# Patient Record
Sex: Female | Born: 1980
Health system: Southern US, Community
[De-identification: ages and names within clinical notes are randomized; demographics above are authoritative.]

## PROBLEM LIST (undated history)

## (undated) DIAGNOSIS — E0590 Thyrotoxicosis, unspecified without thyrotoxic crisis or storm: Secondary | ICD-10-CM

## (undated) DIAGNOSIS — E785 Hyperlipidemia, unspecified: Secondary | ICD-10-CM

## (undated) DIAGNOSIS — R7611 Nonspecific reaction to tuberculin skin test without active tuberculosis: Secondary | ICD-10-CM

## (undated) DIAGNOSIS — E041 Nontoxic single thyroid nodule: Secondary | ICD-10-CM

## (undated) DIAGNOSIS — H547 Unspecified visual loss: Secondary | ICD-10-CM

## (undated) DIAGNOSIS — R768 Other specified abnormal immunological findings in serum: Secondary | ICD-10-CM

## (undated) DIAGNOSIS — E119 Type 2 diabetes mellitus without complications: Secondary | ICD-10-CM

## (undated) DIAGNOSIS — O24419 Gestational diabetes mellitus in pregnancy, unspecified control: Secondary | ICD-10-CM

## (undated) HISTORY — DX: Nontoxic single thyroid nodule: E04.1

## (undated) HISTORY — DX: Unspecified visual loss: H54.7

## (undated) HISTORY — DX: Other specified abnormal immunological findings in serum: R76.8

## (undated) HISTORY — DX: Thyrotoxicosis, unspecified without thyrotoxic crisis or storm: E05.90

## (undated) HISTORY — DX: Hyperlipidemia, unspecified: E78.5

## (undated) HISTORY — DX: Nonspecific reaction to tuberculin skin test without active tuberculosis: R76.11

---

## 1999-11-11 ENCOUNTER — Emergency Department (HOSPITAL_COMMUNITY): Admission: EM | Admit: 1999-11-11 | Discharge: 1999-11-11 | Payer: Self-pay | Admitting: Emergency Medicine

## 2000-01-30 ENCOUNTER — Ambulatory Visit (HOSPITAL_COMMUNITY): Admission: RE | Admit: 2000-01-30 | Discharge: 2000-01-30 | Payer: Self-pay | Admitting: *Deleted

## 2000-04-07 ENCOUNTER — Ambulatory Visit (HOSPITAL_COMMUNITY): Admission: RE | Admit: 2000-04-07 | Discharge: 2000-04-07 | Payer: Self-pay | Admitting: *Deleted

## 2000-06-01 ENCOUNTER — Ambulatory Visit (HOSPITAL_COMMUNITY): Admission: RE | Admit: 2000-06-01 | Discharge: 2000-06-01 | Payer: Self-pay | Admitting: *Deleted

## 2000-06-23 ENCOUNTER — Inpatient Hospital Stay (HOSPITAL_COMMUNITY): Admission: AD | Admit: 2000-06-23 | Discharge: 2000-06-23 | Payer: Self-pay | Admitting: *Deleted

## 2000-07-21 ENCOUNTER — Inpatient Hospital Stay (HOSPITAL_COMMUNITY): Admission: AD | Admit: 2000-07-21 | Discharge: 2000-07-23 | Payer: Self-pay | Admitting: Obstetrics

## 2002-03-31 ENCOUNTER — Inpatient Hospital Stay (HOSPITAL_COMMUNITY): Admission: AD | Admit: 2002-03-31 | Discharge: 2002-03-31 | Payer: Self-pay | Admitting: Obstetrics and Gynecology

## 2002-04-13 ENCOUNTER — Encounter: Admission: RE | Admit: 2002-04-13 | Discharge: 2002-04-13 | Payer: Self-pay | Admitting: Obstetrics and Gynecology

## 2002-08-28 ENCOUNTER — Inpatient Hospital Stay (HOSPITAL_COMMUNITY): Admission: AD | Admit: 2002-08-28 | Discharge: 2002-08-28 | Payer: Self-pay | Admitting: *Deleted

## 2002-09-05 ENCOUNTER — Encounter: Admission: RE | Admit: 2002-09-05 | Discharge: 2002-09-05 | Payer: Self-pay | Admitting: Obstetrics and Gynecology

## 2002-09-17 ENCOUNTER — Inpatient Hospital Stay (HOSPITAL_COMMUNITY): Admission: AD | Admit: 2002-09-17 | Discharge: 2002-09-17 | Payer: Self-pay | Admitting: *Deleted

## 2002-11-07 ENCOUNTER — Encounter: Admission: RE | Admit: 2002-11-07 | Discharge: 2002-11-07 | Payer: Self-pay | Admitting: Obstetrics and Gynecology

## 2002-11-08 ENCOUNTER — Emergency Department (HOSPITAL_COMMUNITY): Admission: EM | Admit: 2002-11-08 | Discharge: 2002-11-08 | Payer: Self-pay | Admitting: Emergency Medicine

## 2002-11-09 ENCOUNTER — Emergency Department (HOSPITAL_COMMUNITY): Admission: EM | Admit: 2002-11-09 | Discharge: 2002-11-09 | Payer: Self-pay | Admitting: Emergency Medicine

## 2002-11-09 ENCOUNTER — Encounter: Payer: Self-pay | Admitting: Emergency Medicine

## 2003-02-21 ENCOUNTER — Inpatient Hospital Stay (HOSPITAL_COMMUNITY): Admission: AD | Admit: 2003-02-21 | Discharge: 2003-02-21 | Payer: Self-pay | Admitting: Obstetrics and Gynecology

## 2003-03-01 ENCOUNTER — Encounter: Admission: RE | Admit: 2003-03-01 | Discharge: 2003-03-01 | Payer: Self-pay | Admitting: Family Medicine

## 2003-03-13 ENCOUNTER — Encounter: Admission: RE | Admit: 2003-03-13 | Discharge: 2003-03-13 | Payer: Self-pay | Admitting: Obstetrics and Gynecology

## 2003-04-12 ENCOUNTER — Encounter: Admission: RE | Admit: 2003-04-12 | Discharge: 2003-04-12 | Payer: Self-pay | Admitting: Obstetrics and Gynecology

## 2003-05-16 ENCOUNTER — Emergency Department (HOSPITAL_COMMUNITY): Admission: EM | Admit: 2003-05-16 | Discharge: 2003-05-16 | Payer: Self-pay | Admitting: Emergency Medicine

## 2006-07-13 DIAGNOSIS — H547 Unspecified visual loss: Secondary | ICD-10-CM

## 2006-07-13 HISTORY — DX: Unspecified visual loss: H54.7

## 2009-07-13 DIAGNOSIS — E119 Type 2 diabetes mellitus without complications: Secondary | ICD-10-CM

## 2009-07-13 HISTORY — DX: Type 2 diabetes mellitus without complications: E11.9

## 2009-08-16 ENCOUNTER — Ambulatory Visit: Payer: Self-pay | Admitting: Obstetrics and Gynecology

## 2009-08-16 ENCOUNTER — Inpatient Hospital Stay (HOSPITAL_COMMUNITY): Admission: AD | Admit: 2009-08-16 | Discharge: 2009-08-16 | Payer: Self-pay | Admitting: Family Medicine

## 2009-09-13 ENCOUNTER — Ambulatory Visit: Payer: Self-pay | Admitting: Obstetrics and Gynecology

## 2009-09-13 ENCOUNTER — Inpatient Hospital Stay (HOSPITAL_COMMUNITY): Admission: AD | Admit: 2009-09-13 | Discharge: 2009-09-13 | Payer: Self-pay | Admitting: Obstetrics & Gynecology

## 2009-10-26 ENCOUNTER — Inpatient Hospital Stay (HOSPITAL_COMMUNITY): Admission: AD | Admit: 2009-10-26 | Discharge: 2009-10-27 | Payer: Self-pay | Admitting: Obstetrics

## 2009-10-27 ENCOUNTER — Emergency Department (HOSPITAL_COMMUNITY): Admission: EM | Admit: 2009-10-27 | Discharge: 2009-10-27 | Payer: Self-pay | Admitting: Emergency Medicine

## 2009-12-17 ENCOUNTER — Ambulatory Visit (HOSPITAL_COMMUNITY)
Admission: RE | Admit: 2009-12-17 | Discharge: 2009-12-17 | Payer: Self-pay | Source: Home / Self Care | Admitting: Obstetrics

## 2009-12-26 ENCOUNTER — Encounter: Payer: Self-pay | Admitting: Obstetrics

## 2009-12-26 ENCOUNTER — Encounter: Admission: RE | Admit: 2009-12-26 | Discharge: 2009-12-26 | Payer: Self-pay | Admitting: Obstetrics

## 2010-01-18 ENCOUNTER — Inpatient Hospital Stay (HOSPITAL_COMMUNITY): Admission: AD | Admit: 2010-01-18 | Discharge: 2010-01-18 | Payer: Self-pay | Admitting: Obstetrics

## 2010-01-18 ENCOUNTER — Ambulatory Visit: Payer: Self-pay | Admitting: Nurse Practitioner

## 2010-02-18 ENCOUNTER — Ambulatory Visit (HOSPITAL_COMMUNITY): Admission: RE | Admit: 2010-02-18 | Discharge: 2010-02-18 | Payer: Self-pay | Admitting: Obstetrics

## 2010-02-21 ENCOUNTER — Encounter: Payer: Self-pay | Admitting: Obstetrics

## 2010-02-21 ENCOUNTER — Inpatient Hospital Stay (HOSPITAL_COMMUNITY): Admission: AD | Admit: 2010-02-21 | Discharge: 2010-02-24 | Payer: Self-pay | Admitting: Obstetrics

## 2010-07-15 ENCOUNTER — Inpatient Hospital Stay (HOSPITAL_COMMUNITY)
Admission: AD | Admit: 2010-07-15 | Discharge: 2010-07-15 | Payer: Self-pay | Source: Home / Self Care | Attending: Obstetrics & Gynecology | Admitting: Obstetrics & Gynecology

## 2010-08-03 ENCOUNTER — Encounter: Payer: Self-pay | Admitting: Obstetrics

## 2010-08-11 ENCOUNTER — Emergency Department (HOSPITAL_COMMUNITY)
Admission: EM | Admit: 2010-08-11 | Discharge: 2010-08-11 | Payer: Self-pay | Source: Home / Self Care | Admitting: Emergency Medicine

## 2010-08-11 LAB — URINALYSIS, ROUTINE W REFLEX MICROSCOPIC
Bilirubin Urine: NEGATIVE
Hgb urine dipstick: NEGATIVE
Ketones, ur: NEGATIVE mg/dL
Nitrite: NEGATIVE
Protein, ur: NEGATIVE mg/dL
Specific Gravity, Urine: 1.019 (ref 1.005–1.030)
Urine Glucose, Fasting: NEGATIVE mg/dL
Urobilinogen, UA: 0.2 mg/dL (ref 0.0–1.0)
pH: 6.5 (ref 5.0–8.0)

## 2010-08-11 LAB — DIFFERENTIAL
Basophils Absolute: 0 10*3/uL (ref 0.0–0.1)
Basophils Relative: 0 % (ref 0–1)
Eosinophils Absolute: 0.1 10*3/uL (ref 0.0–0.7)
Eosinophils Relative: 1 % (ref 0–5)
Lymphocytes Relative: 13 % (ref 12–46)
Lymphs Abs: 1.2 10*3/uL (ref 0.7–4.0)
Monocytes Absolute: 0.6 10*3/uL (ref 0.1–1.0)
Monocytes Relative: 6 % (ref 3–12)
Neutro Abs: 7.5 10*3/uL (ref 1.7–7.7)
Neutrophils Relative %: 80 % — ABNORMAL HIGH (ref 43–77)

## 2010-08-11 LAB — COMPREHENSIVE METABOLIC PANEL
ALT: 17 U/L (ref 0–35)
AST: 21 U/L (ref 0–37)
Albumin: 3.6 g/dL (ref 3.5–5.2)
Alkaline Phosphatase: 67 U/L (ref 39–117)
BUN: 8 mg/dL (ref 6–23)
CO2: 22 mEq/L (ref 19–32)
Calcium: 9.2 mg/dL (ref 8.4–10.5)
Chloride: 109 mEq/L (ref 96–112)
Creatinine, Ser: 0.51 mg/dL (ref 0.4–1.2)
GFR calc Af Amer: >60 mL/min (ref >60)
GFR calc non Af Amer: >60 mL/min (ref >60)
Glucose, Bld: 191 mg/dL — ABNORMAL HIGH (ref 70–99)
Potassium: 3.8 mEq/L (ref 3.5–5.1)
Sodium: 140 mEq/L (ref 135–145)
Total Bilirubin: 0.7 mg/dL (ref 0.3–1.2)
Total Protein: 7.7 g/dL (ref 6.0–8.3)

## 2010-08-11 LAB — CBC
HCT: 38.6 % (ref 36.0–46.0)
Hemoglobin: 13 g/dL (ref 12.0–15.0)
MCH: 28.1 pg (ref 26.0–34.0)
MCHC: 33.7 g/dL (ref 30.0–36.0)
MCV: 83.4 fL (ref 78.0–100.0)
Platelets: 228 10*3/uL (ref 150–400)
RBC: 4.63 MIL/uL (ref 3.87–5.11)
RDW: 14.3 % (ref 11.5–15.5)
WBC: 9.4 10*3/uL (ref 4.0–10.5)

## 2010-08-11 LAB — LIPASE, BLOOD: Lipase: 19 U/L (ref 11–59)

## 2010-08-11 LAB — URINE MICROSCOPIC-ADD ON

## 2010-08-11 LAB — PREGNANCY, URINE: Preg Test, Ur: NEGATIVE

## 2010-09-22 LAB — URINALYSIS, ROUTINE W REFLEX MICROSCOPIC
Bilirubin Urine: NEGATIVE
Glucose, UA: NEGATIVE mg/dL
Ketones, ur: NEGATIVE mg/dL
Leukocytes, UA: NEGATIVE
Nitrite: NEGATIVE
Protein, ur: NEGATIVE mg/dL
Specific Gravity, Urine: 1.01 (ref 1.005–1.030)
Urobilinogen, UA: 0.2 mg/dL (ref 0.0–1.0)
pH: 6.5 (ref 5.0–8.0)

## 2010-09-22 LAB — CBC
HCT: 39.8 % (ref 36.0–46.0)
Hemoglobin: 12.6 g/dL (ref 12.0–15.0)
MCH: 26.4 pg (ref 26.0–34.0)
MCHC: 31.7 g/dL (ref 30.0–36.0)
MCV: 83.4 fL (ref 78.0–100.0)
Platelets: 222 10*3/uL (ref 150–400)
RBC: 4.77 MIL/uL (ref 3.87–5.11)
RDW: 14.1 % (ref 11.5–15.5)
WBC: 6 10*3/uL (ref 4.0–10.5)

## 2010-09-22 LAB — POCT PREGNANCY, URINE: Preg Test, Ur: NEGATIVE

## 2010-09-22 LAB — WET PREP, GENITAL
WBC, Wet Prep HPF POC: NONE SEEN
Yeast Wet Prep HPF POC: NONE SEEN

## 2010-09-22 LAB — URINE MICROSCOPIC-ADD ON

## 2010-09-22 LAB — GC/CHLAMYDIA PROBE AMP, GENITAL
Chlamydia, DNA Probe: NEGATIVE
GC Probe Amp, Genital: NEGATIVE

## 2010-09-26 LAB — GLUCOSE, CAPILLARY
Glucose-Capillary: 111 mg/dL — ABNORMAL HIGH (ref 70–99)
Glucose-Capillary: 80 mg/dL (ref 70–99)

## 2010-09-26 LAB — CBC
HCT: 38.2 % (ref 36.0–46.0)
Hemoglobin: 12.7 g/dL (ref 12.0–15.0)
MCH: 30.2 pg (ref 26.0–34.0)
MCHC: 33.3 g/dL (ref 30.0–36.0)
MCHC: 33.8 g/dL (ref 30.0–36.0)
MCV: 88.1 fL (ref 78.0–100.0)
Platelets: 152 10*3/uL (ref 150–400)
RBC: 3.74 MIL/uL — ABNORMAL LOW (ref 3.87–5.11)

## 2010-09-28 LAB — CBC
HCT: 35.2 % — ABNORMAL LOW (ref 36.0–46.0)
MCH: 29.5 pg (ref 26.0–34.0)
MCHC: 33.4 g/dL (ref 30.0–36.0)
MCV: 88.3 fL (ref 78.0–100.0)
RDW: 13.8 % (ref 11.5–15.5)

## 2010-09-28 LAB — URINALYSIS, ROUTINE W REFLEX MICROSCOPIC
Bilirubin Urine: NEGATIVE
Ketones, ur: NEGATIVE mg/dL
Nitrite: NEGATIVE
Specific Gravity, Urine: 1.01 (ref 1.005–1.030)
Urobilinogen, UA: 0.2 mg/dL (ref 0.0–1.0)

## 2010-09-28 LAB — BASIC METABOLIC PANEL
BUN: 2 mg/dL — ABNORMAL LOW (ref 6–23)
CO2: 23 mEq/L (ref 19–32)
Chloride: 108 mEq/L (ref 96–112)
Glucose, Bld: 93 mg/dL (ref 70–99)
Potassium: 3.6 mEq/L (ref 3.5–5.1)

## 2010-09-28 LAB — GLUCOSE, CAPILLARY: Glucose-Capillary: 106 mg/dL — ABNORMAL HIGH (ref 70–99)

## 2010-09-30 LAB — COMPREHENSIVE METABOLIC PANEL
AST: 19 U/L (ref 0–37)
CO2: 23 mEq/L (ref 19–32)
Calcium: 9.5 mg/dL (ref 8.4–10.5)
Creatinine, Ser: 0.34 mg/dL — ABNORMAL LOW (ref 0.4–1.2)
GFR calc Af Amer: >60 mL/min (ref >60)
GFR calc non Af Amer: >60 mL/min (ref >60)

## 2010-09-30 LAB — CBC
MCHC: 35.3 g/dL (ref 30.0–36.0)
MCV: 87.4 fL (ref 78.0–100.0)
Platelets: 170 10*3/uL (ref 150–400)
RBC: 3.87 MIL/uL (ref 3.87–5.11)
RDW: 14.2 % (ref 11.5–15.5)

## 2010-09-30 LAB — URINALYSIS, ROUTINE W REFLEX MICROSCOPIC
Bilirubin Urine: NEGATIVE
Nitrite: NEGATIVE
Nitrite: NEGATIVE
Specific Gravity, Urine: 1.009 (ref 1.005–1.030)
Specific Gravity, Urine: 1.01 (ref 1.005–1.030)
Urobilinogen, UA: 0.2 mg/dL (ref 0.0–1.0)
pH: 7.5 (ref 5.0–8.0)

## 2010-09-30 LAB — LIPASE, BLOOD: Lipase: 16 U/L (ref 11–59)

## 2010-09-30 LAB — DIFFERENTIAL
Eosinophils Relative: 3 % (ref 0–5)
Lymphocytes Relative: 16 % (ref 12–46)
Lymphs Abs: 1.3 10*3/uL (ref 0.7–4.0)

## 2010-09-30 LAB — GLUCOSE, CAPILLARY: Glucose-Capillary: 114 mg/dL — ABNORMAL HIGH (ref 70–99)

## 2010-10-01 LAB — CBC
Platelets: 221 10*3/uL (ref 150–400)
RDW: 14.4 % (ref 11.5–15.5)

## 2010-10-01 LAB — GLUCOSE, CAPILLARY

## 2010-10-01 LAB — POCT PREGNANCY, URINE: Preg Test, Ur: POSITIVE

## 2010-10-03 LAB — URINALYSIS, ROUTINE W REFLEX MICROSCOPIC
Bilirubin Urine: NEGATIVE
Glucose, UA: 250 mg/dL — AB
Ketones, ur: NEGATIVE mg/dL
Nitrite: NEGATIVE
Protein, ur: NEGATIVE mg/dL

## 2010-11-28 NOTE — Group Therapy Note (Signed)
   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  TSH,  LH, and FSH were done previously and were all normal.  The patient was  placed on a temperature chart, but apparently had never used it.  She was in  the MAU because she had gone three months without a period and then had an  18-day period.  As of now she is not bleeding.  She was obviously  dysfunctional bleeding episode due to inovulation.  We talked to the patient  about trying Clomid for one month or starting her on a temperature chart,  starting her on Clomid 50 mg once a day for five days.  If this is not  successful we will up it to 10 mg, etc.  She seems to understand and we have  gone over once again the explanation of basal temperature chart and trying  to get this lady ovulatory.  Certainly, consideration has to be made for  trying Glucophage also along with this as we progress.   IMPRESSION:  Anovulatory bleeding probably secondary to polycystic ovarian  syndrome.                                               Argentina Donovan, MD    PR/MEDQ  D:  03/01/2003  T:  03/02/2003  Job:  [id number]

## 2010-11-28 NOTE — Group Therapy Note (Signed)
   NAMEBREYANNA, Harris NO.:  [id number]   MEDICAL RECORD NO.:  [id number]                   PATIENT TYPE:  OUT   LOCATION:  [facility]                           FACILITY:  [facility]   PHYSICIAN:  Argentina Donovan, MD                     DATE OF BIRTH:  1981/03/08   DATE OF SERVICE:  04/12/2003                                    CLINIC NOTE   CHIEF COMPLAINT:  Follow up infertility.   SUBJECTIVE:  The patient comes in today requesting a basal body temperature  thermometer.  At her last visit with Dr. Okey Dupre, March 13, 2003, she was  placed on Clomid and asked to get a thermometer but when she went to the [facility] on [address] they told her that they did not have any.  She has not been charting her body temperatures because of this and returns  today needing a thermometer.  Her last menstrual period April 10, 2003.  They have been regular the last few months.  No other complaints.   OBJECTIVE:  Vital signs noted.  In general obese female awake and alert in  no acute distress.  Hirsutism on the cheeks.  Acanthosis nigricans on the  neck.   ASSESSMENT AND PLAN:  Anovulatory bleeding probably secondary to polycystic  ovarian syndrome.  The patient was given a basal body thermometer today.  She has already been given body temperature charts and told how to use the  thermometer.  Gave further prescription for Clomid 50 mg one p.o. daily on  day 5 through 9 of the cycle, with no refills, and advised the patient on  how to use this medicine.  She will follow up in 30 days if she does not  have another menses or follow up when she gets her next period.     Rosemarie Ax, MD                      Argentina Donovan, MD    NR/MEDQ  D:  04/12/2003  T:  04/12/2003  Job:  [id number]

## 2012-08-15 ENCOUNTER — Emergency Department (HOSPITAL_COMMUNITY)
Admission: EM | Admit: 2012-08-15 | Discharge: 2012-08-15 | Disposition: A | Discharge status: Home / Self Care | Payer: 59 | Source: Home / Self Care | Attending: Emergency Medicine | Admitting: Emergency Medicine

## 2012-08-15 ENCOUNTER — Encounter (HOSPITAL_COMMUNITY): Payer: Self-pay

## 2012-08-15 DIAGNOSIS — E119 Type 2 diabetes mellitus without complications: Secondary | ICD-10-CM

## 2012-08-15 DIAGNOSIS — R531 Weakness: Secondary | ICD-10-CM

## 2012-08-15 DIAGNOSIS — R5381 Other malaise: Secondary | ICD-10-CM

## 2012-08-15 DIAGNOSIS — R51 Headache: Secondary | ICD-10-CM

## 2012-08-15 DIAGNOSIS — R42 Dizziness and giddiness: Secondary | ICD-10-CM

## 2012-08-15 HISTORY — DX: Type 2 diabetes mellitus without complications: E11.9

## 2012-08-15 LAB — POCT I-STAT, CHEM 8
BUN: 6 mg/dL (ref 6–23)
Creatinine, Ser: 0.4 mg/dL — ABNORMAL LOW (ref 0.50–1.10)
Glucose, Bld: 135 mg/dL — ABNORMAL HIGH (ref 70–99)
Potassium: 3.9 mEq/L (ref 3.5–5.1)
Sodium: 140 mEq/L (ref 135–145)
TCO2: 23 mmol/L (ref 0–100)

## 2012-08-15 MED ORDER — METFORMIN HCL 500 MG PO TABS: 500.0000 mg | ORAL_TABLET | Freq: Two times a day (BID) | ORAL | Status: DC

## 2012-08-15 MED ORDER — MECLIZINE HCL 25 MG PO TABS: 25.0000 mg | ORAL_TABLET | Freq: Three times a day (TID) | ORAL | Status: DC | PRN

## 2012-08-15 MED ORDER — NAPROXEN 500 MG PO TABS: 500.0000 mg | ORAL_TABLET | Freq: Two times a day (BID) | ORAL | Status: DC

## 2012-08-15 NOTE — ED Notes (Signed)
Called to triage this pt. Pt c/o feeling "weak, dizzy, & having a headache all day." Pt states that she is out of her DM rx x approx 2 weeks. Pt denies CP, SOB, abd pain, n/v/d, vision changes, & all other neuro complaints (besides prev doc). Pt CBG is 125 here. Pt skin W&D, speaks in complete multiple word sentences w/o taking a breath in between words in same sentence. Pt & family member instructed to advise a staff member if anything changed in her current complaint/condition prior to being called to the treatment area/exam room. Pt & her family member verbalized their understanding of this instruction.

## 2012-08-15 NOTE — ED Provider Notes (Signed)
Chief Complaint  Patient presents with  . Headache    History of Present Illness:   [name] is a 32 year old female who presents today for multiple symptoms including weakness, tremulousness, dizziness, and headache. She's had a two-year history of diabetes and has been taking a medication for this, although she does not know the name of the medication. She ran out of this 2 weeks ago. She denies any polyuria, polydipsia, or dry mouth. She states her vision has been a little bit blurry and sometimes she sees double. She has felt weak, shaky, and sometimes has hot flashes since this morning. She's felt dizzy and lightheaded. She feels like her head is spinning. She's also had a bifrontal headache and some nausea. She denies any photophobia or phonophobia. She had slight nasal congestion rhinorrhea but denies sore throat cough. Her eyes have been watery. She denies any focal weakness or paresthesias. No difficulty speaking, swallowing, or with coordination or balance.  Review of Systems:  Other than noted above, the patient denies any of the following symptoms. Systemic:  No fever, chills, sweats, fatigue, myalgias, headache, or anorexia. Eye:  No redness, pain or drainage. ENT:  No earache, nasal congestion, rhinorrhea, sinus pressure, or sore throat. Lungs:  No cough, sputum production, wheezing, shortness of breath.  Cardiovascular:  No chest pain, palpitations, or syncope. GI:  No nausea, vomiting, abdominal pain or diarrhea. GU:  No dysuria, frequency, or hematuria. Skin:  No rash or pruritis.  PMFSH:  Past medical history, family history, social history, meds, and allergies were reviewed.   Physical Exam:   Vital signs:  BP 127/77  Pulse 90  Temp 98.9 F (37.2 C) (Oral)  Resp 20  SpO2 98% General:  Alert, in no distress. Eye:  PERRL, full EOMs.  Lids and conjunctivas were normal. ENT:  TMs and canals were normal, without erythema or inflammation.  Nasal mucosa was clear and  uncongested, without drainage.  Mucous membranes were moist.  Pharynx was clear, without exudate or drainage.  There were no oral ulcerations or lesions. Neck:  Supple, no adenopathy, tenderness or mass. Thyroid was normal. Lungs:  No respiratory distress.  Lungs were clear to auscultation, without wheezes, rales or rhonchi.  Breath sounds were clear and equal bilaterally. Heart:  Regular rhythm, without gallops, murmers or rubs. Abdomen:  Soft, flat, and non-tender to palpation.  No hepatosplenomagaly or mass. Neurological exam: Neurological examination: The patient is alert and oriented x3. Speech is clear, fluent, and appropriate. Cranial nerves are intact. There is no pronator drift and finger to nose was normal. Muscle strength, sensation, and DTRs are normal. Babinskis are downgoing. Station and gait were normal. Romberg sign is abnormal in that the patient was unable to maintain balance while standing with her feet together even with her eyes open, and tandem gait was not performed. Skin:  Clear, warm, and dry, without rash or lesions.  Labs:   Results for orders placed during the hospital encounter of 08/15/12  GLUCOSE, CAPILLARY      Component Value Range   Glucose-Capillary 125 (*) 70 - 99 mg/dL   Comment 1 Documented in Chart     Comment 2 Notify RN    POCT I-STAT, CHEM 8      Component Value Range   Sodium 140  135 - 145 mEq/L   Potassium 3.9  3.5 - 5.1 mEq/L   Chloride 106  96 - 112 mEq/L   BUN 6  6 - 23 mg/dL   Creatinine,  Ser 0.40 (*) 0.50 - 1.10 mg/dL   Glucose, Bld 119 (*) 70 - 99 mg/dL   Calcium, Ion 1.47 (*) 1.12 - 1.23 mmol/L   TCO2 23  0 - 100 mmol/L   Hemoglobin 13.9  12.0 - 15.0 g/dL   HCT 82.9  56.2 - 13.0 %    Assessment:  The primary encounter diagnosis was Dizziness. Diagnoses of Headache, Weakness, and Type 2 diabetes mellitus were also pertinent to this visit.  Despite her being off of her diabetes medicines, her blood sugars aren't too bad right now. I did  give her refill on metformin, although she was unable to tell me the name of the medication that she has been taking for 2 years. She is going to go back to see her primary care physician to followup on her diabetes. Her symptoms could be viral. I'm going to treat symptomatically with meclizine and naproxen and to her return again in 3 or 4 days if no improvement.  Plan:   1.  The following meds were prescribed:   New Prescriptions   MECLIZINE (ANTIVERT) 25 MG TABLET    Take 1 tablet (25 mg total) by mouth 3 (three) times daily as needed.   METFORMIN (GLUCOPHAGE) 500 MG TABLET    Take 1 tablet (500 mg total) by mouth 2 (two) times daily with a meal.   NAPROXEN (NAPROSYN) 500 MG TABLET    Take 1 tablet (500 mg total) by mouth 2 (two) times daily.   2.  The patient was instructed in symptomatic care and handouts were given. 3.  The patient was told to return if becoming worse in any way, if no better in 3 or 4 days, and given some red flag symptoms that would indicate earlier return.    Reuben Likes, MD 08/15/12 1901

## 2012-08-15 NOTE — ED Notes (Signed)
C/o feeling weak and dizzy , out of her DM medication x 2 weeks. Denies CP, SOB, N/V/D, chest pain. Is supposed to see her MD in few days to get refills of her Rx ( not sure what they are)

## 2012-08-18 ENCOUNTER — Emergency Department (INDEPENDENT_AMBULATORY_CARE_PROVIDER_SITE_OTHER)
Admission: EM | Admit: 2012-08-18 | Discharge: 2012-08-18 | Disposition: A | Discharge status: Home / Self Care | Payer: 59 | Source: Home / Self Care | Attending: Family Medicine | Admitting: Family Medicine

## 2012-08-18 ENCOUNTER — Encounter (HOSPITAL_COMMUNITY): Payer: Self-pay | Admitting: *Deleted

## 2012-08-18 DIAGNOSIS — E119 Type 2 diabetes mellitus without complications: Secondary | ICD-10-CM

## 2012-08-18 DIAGNOSIS — R42 Dizziness and giddiness: Secondary | ICD-10-CM

## 2012-08-18 LAB — COMPREHENSIVE METABOLIC PANEL
ALT: 11 U/L (ref 0–35)
AST: 12 U/L (ref 0–37)
Albumin: 3.7 g/dL (ref 3.5–5.2)
Alkaline Phosphatase: 67 U/L (ref 39–117)
Chloride: 101 mEq/L (ref 96–112)
Potassium: 3.9 mEq/L (ref 3.5–5.1)
Sodium: 137 mEq/L (ref 135–145)
Total Protein: 7.8 g/dL (ref 6.0–8.3)

## 2012-08-18 LAB — POCT I-STAT, CHEM 8
BUN: 6 mg/dL (ref 6–23)
Calcium, Ion: 1.24 mmol/L — ABNORMAL HIGH (ref 1.12–1.23)
Creatinine, Ser: 0.5 mg/dL (ref 0.50–1.10)
Glucose, Bld: 203 mg/dL — ABNORMAL HIGH (ref 70–99)
TCO2: 25 mmol/L (ref 0–100)

## 2012-08-18 LAB — HEMOGLOBIN A1C: Hgb A1c MFr Bld: 7.9 % — ABNORMAL HIGH (ref <5.7)

## 2012-08-18 LAB — TSH: TSH: 0.828 u[IU]/mL (ref 0.350–4.500)

## 2012-08-18 MED ORDER — GLIPIZIDE 2.5 MG HALF TABLET: 10.0000 mg | ORAL_TABLET | Freq: Two times a day (BID) | ORAL | Status: DC

## 2012-08-18 NOTE — ED Provider Notes (Signed)
History     CSN: [id number]  Arrival date & time 08/18/12  1014   First MD Initiated Contact with Patient 08/18/12 1030      Chief Complaint  Patient presents with  . Dizziness  . Shaking    (Consider location/radiation/quality/duration/timing/severity/associated sxs/prior treatment) HPI Comments: 32 year old female with history of diabetes type 2. Here complaining of dizziness and feeling shaky. She was seen here last week with similar symptoms after she had been for 2 weeks without her diabetes medications. She had metformin  500 mgrefilled and states that she's taking it twice a day. She is not checking her blood sugar at home. 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 Reports polyuria and polydipsia. Denies sweating. Denies fever or chills. Denies blurry vision. Denies chest pain or shortness of breath. No palpitations. No abdominal pain nausea vomiting or diarrhea. No falls or loss of balance. Patient reports she has a primary care provider followup appointment next Tuesday for diabetes follow up. No prior history of thyroid disease.   Past Medical History  Diagnosis Date  . Diabetes mellitus without complication     History reviewed. No pertinent past surgical history.  Family History  Problem Relation Age of Onset  . Family history unknown: Yes    History  Substance Use Topics  . Smoking status: Never Smoker   . Smokeless tobacco: Not on file  . Alcohol Use: No    OB History    Grav Para Term Preterm Abortions TAB SAB Ect Mult Living                  Review of Systems  Constitutional: Negative for fever, chills, diaphoresis and appetite change.  HENT: Negative for congestion.   Eyes: Negative for visual disturbance.  Respiratory: Negative for cough, chest tightness and shortness of breath.   Cardiovascular: Negative for chest pain, palpitations and leg swelling.   Gastrointestinal: Negative for nausea, vomiting, abdominal pain and diarrhea.  Genitourinary: Negative for dysuria and flank pain.  Skin: Negative for rash.  Neurological: Positive for dizziness. Negative for headaches.    Allergies  Review of patient's allergies indicates no known allergies.  Home Medications   Current Outpatient Rx  Name  Route  Sig  Dispense  Refill  . GLIPIZIDE 2.5 MG HALF TABLET   Oral   Take 2 tablets (10 mg total) by mouth 2 (two) times daily before a meal.   60 tablet   0   . MECLIZINE HCL 25 MG PO TABS   Oral   Take 1 tablet (25 mg total) by mouth 3 (three) times daily as needed.   30 tablet   0   . METFORMIN HCL 500 MG PO TABS   Oral   Take 1 tablet (500 mg total) by mouth 2 (two) times daily with a meal.   60 tablet   3   . NAPROXEN 500 MG PO TABS   Oral   Take 1 tablet (500 mg total) by mouth 2 (two) times daily.   30 tablet   0     BP 116/83  Pulse 80  Temp 99 F (37.2 C) (Oral)  Resp 24  SpO2 100%  LMP 08/18/2012  Physical Exam  Nursing note and vitals reviewed. Constitutional: She is oriented to person, place, and time. She appears well-developed and well-nourished. No distress.       Appears anxious  HENT:  Head: Normocephalic and atraumatic.  Mouth/Throat: Oropharynx is clear and moist.  Neck: Neck supple. Thyromegaly present.       Impress goiter. No tenderness over thyroid gland.  Cardiovascular: Normal rate, regular rhythm, normal heart sounds and intact distal pulses.  Exam reveals no gallop and no friction rub.   No murmur heard. Pulmonary/Chest: Effort normal and breath sounds normal.  Abdominal: Soft. There is no tenderness.  Neurological: She is alert and oriented to person, place, and time. She has normal strength and normal reflexes. She displays no tremor. No sensory deficit. She displays a negative Romberg sign. Coordination and gait normal.  Skin: No rash noted. She is not diaphoretic.       No wounds or  signs of skin infection  Psychiatric: Her speech is normal and behavior is normal. Judgment and thought content normal. Her mood appears anxious. Cognition and memory are normal.    ED Course  Procedures (including critical care time)  Labs Reviewed  GLUCOSE, CAPILLARY - Abnormal; Notable for the following:    Glucose-Capillary 233 (*)     All other components within normal limits  POCT I-STAT, CHEM 8 - Abnormal; Notable for the following:    Glucose, Bld 203 (*)     Calcium, Ion 1.24 (*)     All other components within normal limits  TSH  HEMOGLOBIN A1C  COMPREHENSIVE METABOLIC PANEL   No results found.   1. Diabetes   2. Dizziness       MDM  32 year old female with history of diabetes here complaining of dizziness and feeling shaky. On examination other than goiter and that patient appears anxious she has otherwise normal examination findings including neurologic, also with normal vital signs. No prior history of thyroid disease. Glucose 203 today. TSH, complete metabolic panel and hemoglobin N8G pending at the time of discharge. Added glipizide 2.5 mg twice a day with meals to her current diabetes medications. Patient reports she has a PCP followup appointment in 4 days. We will contact her about test results and she will be able to pick her results up to take to her next appointment. I tried to contact her primary care provider but the number provided by patient was not a clinic number. Supportive care and red flags that should prompt her return to medical attention discussed with patient and provided in writing.        Sharin Grave, MD 08/18/12 1207

## 2012-08-18 NOTE — ED Notes (Signed)
Pt reports dizziness and shaking -no improvement since last week when she was here last -states that she has been taking meds properly - denies n/v/d or any other complaint

## 2012-08-25 NOTE — ED Notes (Signed)
Hgb A1C 7.9 H,  Glucose 100, TSH 0.828.  Message sent to Dr. Alfonse Ras.  She wrote no action needed. [name] 08/25/2012

## 2012-11-16 ENCOUNTER — Emergency Department (HOSPITAL_COMMUNITY)
Admission: EM | Admit: 2012-11-16 | Discharge: 2012-11-16 | Disposition: A | Discharge status: Home / Self Care | Payer: 59 | Attending: Emergency Medicine | Admitting: Emergency Medicine

## 2012-11-16 ENCOUNTER — Encounter (HOSPITAL_COMMUNITY): Payer: Self-pay | Admitting: Emergency Medicine

## 2012-11-16 ENCOUNTER — Emergency Department (HOSPITAL_COMMUNITY): Payer: 59

## 2012-11-16 DIAGNOSIS — Z79899 Other long term (current) drug therapy: Secondary | ICD-10-CM | POA: Insufficient documentation

## 2012-11-16 DIAGNOSIS — M25522 Pain in left elbow: Secondary | ICD-10-CM

## 2012-11-16 DIAGNOSIS — M7712 Lateral epicondylitis, left elbow: Secondary | ICD-10-CM

## 2012-11-16 DIAGNOSIS — E119 Type 2 diabetes mellitus without complications: Secondary | ICD-10-CM | POA: Insufficient documentation

## 2012-11-16 DIAGNOSIS — M25529 Pain in unspecified elbow: Secondary | ICD-10-CM | POA: Insufficient documentation

## 2012-11-16 DIAGNOSIS — M7710 Lateral epicondylitis, unspecified elbow: Secondary | ICD-10-CM | POA: Insufficient documentation

## 2012-11-16 DIAGNOSIS — Z9181 History of falling: Secondary | ICD-10-CM | POA: Insufficient documentation

## 2012-11-16 MED ORDER — IBUPROFEN 600 MG PO TABS: 600.0000 mg | ORAL_TABLET | Freq: Four times a day (QID) | ORAL | Status: DC | PRN

## 2012-11-16 MED ORDER — OXYCODONE-ACETAMINOPHEN 5-325 MG PO TABS: 2.0000 | ORAL_TABLET | Freq: Four times a day (QID) | ORAL | Status: DC | PRN

## 2012-11-16 MED ORDER — OXYCODONE-ACETAMINOPHEN 5-325 MG PO TABS
2.0000 | ORAL_TABLET | Freq: Once | ORAL | Status: AC
  Administered 2012-11-16: 2 via ORAL
  Filled 2012-11-16: qty 2

## 2012-11-16 NOTE — ED Notes (Signed)
PT. REPORTS CHRONIC LEFT ELBOW PAIN FROM A FALL LAST FEB. 2014 UNRELIEVED BY PRESCRIPTION HYDROCODONE .

## 2012-11-16 NOTE — ED Provider Notes (Signed)
History    This chart was scribed for non-physician practitioner working with Vida Roller, MD by Smitty Pluck, ED scribe. This patient was seen in room TR05C/TR05C and the patient's care was started at 10:26 PM.   CSN: [id number]  Arrival date & time 11/16/12  2216   Chief Complaint  Patient presents with  . Elbow Pain   The history is provided by the patient and medical records. No language interpreter was used.   [name] is a 32 y.o. female who presents to the Emergency Department with chief complaint of constant left elbow pain with associated radiation down the left arm after falling September 02, 2012.  The pain has gotten worse today.  Pt saw [facility] after the injury and received an elbow x-ray and shoulder MRI which were both normal.  Pt was prescribed hydrocodone but states it did not cause pain to subside.  Icing area does not relieve pain. Pain is exacerbated by bending the left elbow.  Pt is currently in therapy and has a f/u appointment in 8 days with [facility].   Pt denies fever, chills, nausea, vomiting, diarrhea, weakness, cough, SOB and any other pain.  Pt denies kidney problems.   Past Medical History  Diagnosis Date  . Diabetes mellitus without complication     History reviewed. No pertinent past surgical history.  No family history on file.  History  Substance Use Topics  . Smoking status: Never Smoker   . Smokeless tobacco: Not on file  . Alcohol Use: No    OB History   Grav Para Term Preterm Abortions TAB SAB Ect Mult Living                  Review of Systems 10 Systems reviewed and all are negative for acute change except as noted in the HPI.   Allergies  Review of patient's allergies indicates no known allergies.  Home Medications   Current Outpatient Rx  Name  Route  Sig  Dispense  Refill  . glipiZIDE (GLUCOTROL) 2.5 mg TABS   Oral   Take 2 tablets (10 mg total) by mouth 2 (two) times daily before a meal.   60 tablet   0   . HYDROcodone-acetaminophen (NORCO/VICODIN) 5-325 MG per tablet   Oral   Take 1 tablet by mouth every 6 (six) hours as needed for pain.         . metFORMIN (GLUCOPHAGE) 500 MG tablet   Oral   Take 1 tablet (500 mg total) by mouth 2 (two) times daily with a meal.   60 tablet   3   . ibuprofen (ADVIL,MOTRIN) 600 MG tablet   Oral   Take 1 tablet (600 mg total) by mouth every 6 (six) hours as needed for pain.   30 tablet   0   . oxyCODONE-acetaminophen (PERCOCET/ROXICET) 5-325 MG per tablet   Oral   Take 2 tablets by mouth every 6 (six) hours as needed for pain.   13 tablet   0     BP 132/87  Pulse 98  Temp(Src) 98.5 F (36.9 C) (Oral)  Resp 18  SpO2 99%  LMP 11/11/2012  Physical Exam  Nursing note and vitals reviewed. Constitutional: She is oriented to person, place, and time. She appears well-developed and well-nourished. No distress.  HENT:  Head: Normocephalic and atraumatic.  Eyes: EOM are normal.  Neck: Neck supple. No tracheal deviation present.  Cardiovascular: Normal rate.   Pulmonary/Chest: Effort normal. No respiratory  distress.  Musculoskeletal: Normal range of motion.  Left elbow ROM 5/5.  Strength deferred secondary to pain.  Pain with palpation of medial and lateral epicondyles.  No obvious deformity or abnormality.  No swelling, redness, or signs of infection.  Neurological: She is alert and oriented to person, place, and time.  Skin: Skin is warm and dry.  Psychiatric: She has a normal mood and affect. Her behavior is normal.    ED Course  Procedures (including critical care time) DIAGNOSTIC STUDIES: Oxygen Saturation is 99% on RA, normal by my interpretation.    COORDINATION OF CARE: 10:31 PM Discussed ED treatment of therapy, pain management, and x-rayt with pt and pt agrees.     Labs Reviewed - No data to display Dg Elbow Complete Left  11/16/2012  *RADIOLOGY REPORT*  Clinical Data: 32 year old female fall with pain.   LEFT ELBOW - COMPLETE 3+ VIEW  Comparison: None.  Findings: No evidence of elbow joint effusion. Bone mineralization is within normal limits.  Joint spaces and alignment preserved. Radial head appears intact.  No acute fracture.  IMPRESSION: No acute fracture or dislocation identified about the left elbow.   Original Report Authenticated By: Odessa Fleming III, M.D.      1. Elbow pain, left   2. Lateral epicondylitis, left       MDM  Patient with likely lateral epicondylitis, will treat with ice, NSAIDs, and pain medicine. No bony deformities. Plain films are negative. Orthopedic followup. Patient understands and agrees with the plan. Pain is managed with Percocet in the ED. Her pain is improved. Patient is stable and ready for discharge.      I personally performed the services described in this documentation, which was scribed in my presence. The recorded information has been reviewed and is accurate.    Roxy Horseman, PA-C 11/16/12 2337

## 2012-11-16 NOTE — ED Provider Notes (Signed)
  Medical screening examination/treatment/procedure(s) were performed by non-physician practitioner and as supervising physician I was immediately available for consultation/collaboration.    Vida Roller, MD 11/16/12 [phone]

## 2013-03-11 ENCOUNTER — Emergency Department (HOSPITAL_COMMUNITY)
Admission: EM | Admit: 2013-03-11 | Discharge: 2013-03-11 | Disposition: A | Discharge status: Home / Self Care | Payer: Self-pay | Attending: Emergency Medicine | Admitting: Emergency Medicine

## 2013-03-11 ENCOUNTER — Encounter (HOSPITAL_COMMUNITY): Payer: Self-pay | Admitting: *Deleted

## 2013-03-11 DIAGNOSIS — G8929 Other chronic pain: Secondary | ICD-10-CM

## 2013-03-11 DIAGNOSIS — G8921 Chronic pain due to trauma: Secondary | ICD-10-CM | POA: Insufficient documentation

## 2013-03-11 DIAGNOSIS — E119 Type 2 diabetes mellitus without complications: Secondary | ICD-10-CM | POA: Insufficient documentation

## 2013-03-11 DIAGNOSIS — M25529 Pain in unspecified elbow: Secondary | ICD-10-CM | POA: Insufficient documentation

## 2013-03-11 MED ORDER — OXYCODONE-ACETAMINOPHEN 5-325 MG PO TABS: 1.0000 | ORAL_TABLET | Freq: Once | ORAL | Status: DC

## 2013-03-11 NOTE — ED Provider Notes (Signed)
Medical screening examination/treatment/procedure(s) were performed by non-physician practitioner and as supervising physician I was immediately available for consultation/collaboration.  Flint Melter, MD 03/11/13 [phone]

## 2013-03-11 NOTE — ED Notes (Signed)
Pt in s/p fall today, states she tripped today on the end of her bed and hit her left elbow, c/o pain with movement, no deformity noted, CMS intact

## 2013-03-11 NOTE — ED Provider Notes (Signed)
CSN: [id number]     Arrival date & time 03/11/13  1758 History  .This chart was scribed for non-physician practitioner Fayrene Helper, PA-C working with Flint Melter, MD by [name] Castle, ED scribe. This patient was seen in room TR07C/TR07C and the patient's care was started at 6:06 PM.    Chief Complaint  Patient presents with  . Fall  . Elbow Pain    The history is provided by the patient. No language interpreter was used.   HPI Comments: [name] is a 32 y.o. female who presents to the Emergency Department complaining of constant stabbing left elbow pain, onset 09/02/2012 when originally injured. Pt states the pain is worsened with movement. Pt states the pain radiates to the surrounding area. Pt denies any recent injury. Pt denies shoulder and wrist pain. Pt lifts during her job and is unable to perform her duties. Pt has visited a Dr. Tinnie Gens at [facility] and previously prescribed her hydrocodone, and the pt has been taking it with little relief. Dr. Tinnie Gens performed an MRI, which showed nerve irritation according to the pt. Pt has sling at home.  Denies problem dropping object, or numbness in hand  Past Medical History  Diagnosis Date  . Diabetes mellitus without complication    History reviewed. No pertinent past surgical history. History reviewed. No pertinent family history. History  Substance Use Topics  . Smoking status: Never Smoker   . Smokeless tobacco: Not on file  . Alcohol Use: No   OB History   Grav Para Term Preterm Abortions TAB SAB Ect Mult Living                 Review of Systems  Musculoskeletal: Positive for arthralgias.  All other systems reviewed and are negative.    Allergies  Review of patient's allergies indicates no known allergies.  Home Medications   Current Outpatient Rx  Name  Route  Sig  Dispense  Refill  . glipiZIDE (GLUCOTROL) 2.5 mg TABS   Oral   Take 2 tablets (10 mg total) by mouth 2 (two) times daily before a  meal.   60 tablet   0   . HYDROcodone-acetaminophen (NORCO/VICODIN) 5-325 MG per tablet   Oral   Take 1 tablet by mouth every 6 (six) hours as needed for pain.         Marland Kitchen ibuprofen (ADVIL,MOTRIN) 600 MG tablet   Oral   Take 1 tablet (600 mg total) by mouth every 6 (six) hours as needed for pain.   30 tablet   0   . metFORMIN (GLUCOPHAGE) 500 MG tablet   Oral   Take 1 tablet (500 mg total) by mouth 2 (two) times daily with a meal.   60 tablet   3   . oxyCODONE-acetaminophen (PERCOCET/ROXICET) 5-325 MG per tablet   Oral   Take 2 tablets by mouth every 6 (six) hours as needed for pain.   13 tablet   0    BP 150/92  Pulse 90  Temp(Src) 98.3 F (36.8 C) (Oral)  Resp 18  SpO2 98% Physical Exam  Nursing note and vitals reviewed. Constitutional: She is oriented to person, place, and time. She appears well-developed and well-nourished.  HENT:  Harris: Normocephalic and atraumatic.  Eyes: EOM are normal.  Neck: Normal range of motion. Neck supple.  Cardiovascular: Normal rate.   Pulmonary/Chest: Effort normal.  Musculoskeletal: Normal range of motion.  Left elbow diffused tenderness to palpation. Worsened pain with elbow flexion and extension.  No obvious deformity.   Neurological: She is alert and oriented to person, place, and time.  Skin: Skin is warm and dry.  Skin looks normal in appearance.  Psychiatric: She has a normal mood and affect. Her behavior is normal.    ED Course  Procedures (including critical care time)  DIAGNOSTIC STUDIES: Oxygen Saturation is 98% on room air, normal by my interpretation.    COORDINATION OF CARE: 6:20 PM-Discussed treatment plan which includes referral to another doctor, along with pain medication with pt at bedside and pt agreed to plan.       Labs Review Labs Reviewed - No data to display Imaging Review No results found.  MDM   1. Chronic elbow pain, left    BP 150/92  Pulse 90  Temp(Src) 98.3 F (36.8 C) (Oral)   Resp 18  SpO2 98%   I personally performed the services described in this documentation, which was scribed in my presence. The recorded information has been reviewed and is accurate.    Fayrene Helper, PA-C 03/11/13 1827

## 2013-06-28 ENCOUNTER — Encounter (HOSPITAL_COMMUNITY): Payer: Self-pay | Admitting: Emergency Medicine

## 2013-06-28 ENCOUNTER — Emergency Department (HOSPITAL_COMMUNITY)
Admission: EM | Admit: 2013-06-28 | Discharge: 2013-06-29 | Disposition: A | Discharge status: Home / Self Care | Payer: Self-pay | Attending: Emergency Medicine | Admitting: Emergency Medicine

## 2013-06-28 DIAGNOSIS — J189 Pneumonia, unspecified organism: Secondary | ICD-10-CM

## 2013-06-28 DIAGNOSIS — E119 Type 2 diabetes mellitus without complications: Secondary | ICD-10-CM | POA: Insufficient documentation

## 2013-06-28 DIAGNOSIS — J159 Unspecified bacterial pneumonia: Secondary | ICD-10-CM | POA: Insufficient documentation

## 2013-06-28 NOTE — ED Notes (Signed)
Pt c/o Cold chills, HA, fever, productive cough.

## 2013-06-29 ENCOUNTER — Emergency Department (HOSPITAL_COMMUNITY): Payer: Self-pay

## 2013-06-29 MED ORDER — LEVOFLOXACIN 500 MG PO TABS: 500.0000 mg | ORAL_TABLET | Freq: Every day | ORAL | Status: DC

## 2013-06-29 MED ORDER — LEVOFLOXACIN 500 MG PO TABS
500.0000 mg | ORAL_TABLET | Freq: Once | ORAL | Status: AC
  Administered 2013-06-29: 500 mg via ORAL
  Filled 2013-06-29: qty 1

## 2013-06-29 NOTE — ED Notes (Signed)
Pt reports cough and congestion x 4 days. Reports taking Nyquil and robitussin without relief. Reports productive cough, denies fever.

## 2013-06-29 NOTE — ED Provider Notes (Signed)
CSN: [id number]     Arrival date & time 06/28/13  2023 History   First MD Initiated Contact with Patient 06/29/13 0048     Chief Complaint  Patient presents with  . URI    HPI  Patient presents with concern of cough, fever, headache. Symptoms began several days ago without clear precipitant. Since onset symptoms have not been appreciably changed in spite of using OTC medication. Patient denies smoking, medical issues. She denies rash, confusion, disorientation, abdominal pain, vomiting, diarrhea.   Past Medical History  Diagnosis Date  . Diabetes mellitus without complication    No past surgical history on file. No family history on file. History  Substance Use Topics  . Smoking status: Never Smoker   . Smokeless tobacco: Not on file  . Alcohol Use: No   OB History   Grav Para Term Preterm Abortions TAB SAB Ect Mult Living                 Review of Systems  Constitutional:       Per HPI, otherwise negative  HENT:       Per HPI, otherwise negative  Respiratory:       Per HPI, otherwise negative  Cardiovascular:       Per HPI, otherwise negative  Gastrointestinal: Negative for vomiting.  Endocrine:       Negative aside from HPI  Genitourinary:       Neg aside from HPI   Musculoskeletal:       Per HPI, otherwise negative  Skin: Negative.   Neurological: Negative for syncope.    Allergies  Review of patient's allergies indicates no known allergies.  Home Medications   Current Outpatient Rx  Name  Route  Sig  Dispense  Refill  . HYDROcodone-acetaminophen (NORCO/VICODIN) 5-325 MG per tablet   Oral   Take 1 tablet by mouth every 6 (six) hours as needed for pain.         . Pseudoeph-Doxylamine-DM-APAP (NYQUIL PO)   Oral   Take 2 capsules by mouth at bedtime as needed (for cold symptoms).         . Pseudoephedrine-APAP-DM (DAYQUIL MULTI-SYMPTOM PO)   Oral   Take 2 capsules by mouth every 6 (six) hours as needed (for cold symptoms).          BP  108/60  Pulse 98  Temp(Src) 97.9 F (36.6 C) (Oral)  Resp 20  Ht 5\' 9"  (1.753 m)  Wt 214 lb 6 oz (97.24 kg)  BMI 31.64 kg/m2  SpO2 98%  LMP 05/31/2013 Physical Exam  Nursing note and vitals reviewed. Constitutional: She is oriented to person, place, and time. She appears well-developed and well-nourished. No distress.  HENT:  Head: Normocephalic and atraumatic.  Eyes: Conjunctivae and EOM are normal.  Cardiovascular: Normal rate and regular rhythm.   Pulmonary/Chest: Effort normal and breath sounds normal. No stridor. No respiratory distress.  Abdominal: She exhibits no distension.  Musculoskeletal: She exhibits no edema.  Neurological: She is alert and oriented to person, place, and time. No cranial nerve deficit.  Skin: Skin is warm and dry.  Psychiatric: She has a normal mood and affect.    ED Course  Procedures (including critical care time) Labs Review Labs Reviewed - No data to display Imaging Review Dg Chest 2 View  06/29/2013   CLINICAL DATA:  Persistent cough and congestion for 4 days, upper respiratory infection, history diabetes  EXAM: CHEST  2 VIEW  COMPARISON:  10/27/2009  FINDINGS: Upper  normal heart size.  Mediastinal contours and pulmonary vascularity normal.  Hazy right upper lobe infiltrate consistent with pneumonia.  Remaining lungs clear.  No pleural effusion or pneumothorax.  Bones unremarkable.  IMPRESSION: Right upper lobe pneumonia.   Electronically Signed   By: Ulyses Southward M.D.   On: 06/29/2013 01:27    EKG Interpretation   None       MDM  No diagnosis found. This patient presents with several days of cough, congestion, fever, unrelieved with OTC medication.  On exam she is awake and alert, mildly tachycardic, but otherwise hemodynamic stable.  Patient's evaluation demonstrates the presence of right upper lobe pneumonia.  Patient was started on antibiotics, discharged to follow up with primary care.    Gerhard Munch, MD 06/29/13 0201

## 2013-06-29 NOTE — ED Notes (Signed)
Patient transported to X-ray 

## 2013-06-29 NOTE — ED Notes (Signed)
Waiting on medication from the pharmacy before discharging patient. 

## 2014-02-15 ENCOUNTER — Ambulatory Visit: Payer: 59 | Attending: Internal Medicine

## 2014-03-12 DIAGNOSIS — G5630 Lesion of radial nerve, unspecified upper limb: Secondary | ICD-10-CM | POA: Insufficient documentation

## 2014-03-23 ENCOUNTER — Encounter (HOSPITAL_COMMUNITY)
Admission: RE | Admit: 2014-03-23 | Discharge: 2014-03-23 | Disposition: A | Discharge status: Home / Self Care | Payer: 59 | Source: Ambulatory Visit | Attending: Orthopedic Surgery | Admitting: Orthopedic Surgery

## 2014-03-23 ENCOUNTER — Other Ambulatory Visit (HOSPITAL_COMMUNITY): Payer: Self-pay | Admitting: Orthopedic Surgery

## 2014-03-23 DIAGNOSIS — M25522 Pain in left elbow: Secondary | ICD-10-CM

## 2014-03-23 DIAGNOSIS — M25529 Pain in unspecified elbow: Secondary | ICD-10-CM | POA: Insufficient documentation

## 2014-05-16 DIAGNOSIS — IMO0002 Reserved for concepts with insufficient information to code with codable children: Secondary | ICD-10-CM | POA: Insufficient documentation

## 2014-07-12 ENCOUNTER — Other Ambulatory Visit (HOSPITAL_COMMUNITY): Payer: Self-pay | Admitting: Anesthesiology

## 2014-07-12 ENCOUNTER — Ambulatory Visit (HOSPITAL_COMMUNITY)
Admission: RE | Admit: 2014-07-12 | Discharge: 2014-07-12 | Disposition: A | Discharge status: Home / Self Care | Payer: 59 | Source: Ambulatory Visit | Attending: Anesthesiology | Admitting: Anesthesiology

## 2014-07-12 DIAGNOSIS — M2578 Osteophyte, vertebrae: Secondary | ICD-10-CM | POA: Insufficient documentation

## 2014-07-12 DIAGNOSIS — M25512 Pain in left shoulder: Secondary | ICD-10-CM | POA: Insufficient documentation

## 2014-07-12 DIAGNOSIS — M5030 Other cervical disc degeneration, unspecified cervical region: Secondary | ICD-10-CM

## 2014-07-12 DIAGNOSIS — M542 Cervicalgia: Secondary | ICD-10-CM | POA: Insufficient documentation

## 2014-07-13 DIAGNOSIS — R7611 Nonspecific reaction to tuberculin skin test without active tuberculosis: Secondary | ICD-10-CM

## 2014-07-13 HISTORY — DX: Nonspecific reaction to tuberculin skin test without active tuberculosis: R76.11

## 2014-08-06 DIAGNOSIS — M79603 Pain in arm, unspecified: Secondary | ICD-10-CM | POA: Insufficient documentation

## 2014-09-24 ENCOUNTER — Encounter: Payer: Self-pay | Admitting: Internal Medicine

## 2014-09-24 ENCOUNTER — Ambulatory Visit: Payer: Self-pay | Attending: Internal Medicine | Admitting: Internal Medicine

## 2014-09-24 VITALS — BP 125/82 | HR 76 | Temp 98.0°F | Resp 16 | Wt 214.8 lb

## 2014-09-24 DIAGNOSIS — G8929 Other chronic pain: Secondary | ICD-10-CM | POA: Insufficient documentation

## 2014-09-24 DIAGNOSIS — E119 Type 2 diabetes mellitus without complications: Secondary | ICD-10-CM | POA: Insufficient documentation

## 2014-09-24 DIAGNOSIS — Z331 Pregnant state, incidental: Secondary | ICD-10-CM

## 2014-09-24 DIAGNOSIS — E139 Other specified diabetes mellitus without complications: Secondary | ICD-10-CM

## 2014-09-24 DIAGNOSIS — Z3490 Encounter for supervision of normal pregnancy, unspecified, unspecified trimester: Secondary | ICD-10-CM | POA: Insufficient documentation

## 2014-09-24 DIAGNOSIS — M79602 Pain in left arm: Secondary | ICD-10-CM | POA: Insufficient documentation

## 2014-09-24 LAB — POCT GLYCOSYLATED HEMOGLOBIN (HGB A1C): Hemoglobin A1C: 6.1

## 2014-09-24 LAB — GLUCOSE, POCT (MANUAL RESULT ENTRY): POC GLUCOSE: 216 mg/dL — AB (ref 70–99)

## 2014-09-24 MED ORDER — GLUCOSE BLOOD VI STRP: ORAL_STRIP | Status: DC

## 2014-09-24 NOTE — Patient Instructions (Signed)
Diabetes Mellitus and Food It is important for you to manage your blood sugar (glucose) level. Your blood glucose level can be greatly affected by what you eat. Eating healthier foods in the appropriate amounts throughout the day at about the same time each day will help you control your blood glucose level. It can also help slow or prevent worsening of your diabetes mellitus. Healthy eating may even help you improve the level of your blood pressure and reach or maintain a healthy weight.  HOW CAN FOOD AFFECT ME? Carbohydrates Carbohydrates affect your blood glucose level more than any other type of food. Your dietitian will help you determine how many carbohydrates to eat at each meal and teach you how to count carbohydrates. Counting carbohydrates is important to keep your blood glucose at a healthy level, especially if you are using insulin or taking certain medicines for diabetes mellitus. Alcohol Alcohol can cause sudden decreases in blood glucose (hypoglycemia), especially if you use insulin or take certain medicines for diabetes mellitus. Hypoglycemia can be a life-threatening condition. Symptoms of hypoglycemia (sleepiness, dizziness, and disorientation) are similar to symptoms of having too much alcohol.  If your health care provider has given you approval to drink alcohol, do so in moderation and use the following guidelines:  Women should not have more than one drink per day, and men should not have more than two drinks per day. One drink is equal to:  12 oz of beer.  5 oz of wine.  1 oz of hard liquor.  Do not drink on an empty stomach.  Keep yourself hydrated. Have water, diet soda, or unsweetened iced tea.  Regular soda, juice, and other mixers might contain a lot of carbohydrates and should be counted. WHAT FOODS ARE NOT RECOMMENDED? As you make food choices, it is important to remember that all foods are not the same. Some foods have fewer nutrients per serving than other  foods, even though they might have the same number of calories or carbohydrates. It is difficult to get your body what it needs when you eat foods with fewer nutrients. Examples of foods that you should avoid that are high in calories and carbohydrates but low in nutrients include:  Trans fats (most processed foods list trans fats on the Nutrition Facts label).  Regular soda.  Juice.  Candy.  Sweets, such as cake, pie, doughnuts, and cookies.  Fried foods. WHAT FOODS CAN I EAT? Have nutrient-rich foods, which will nourish your body and keep you healthy. The food you should eat also will depend on several factors, including:  The calories you need.  The medicines you take.  Your weight.  Your blood glucose level.  Your blood pressure level.  Your cholesterol level. You also should eat a variety of foods, including:  Protein, such as meat, poultry, fish, tofu, nuts, and seeds (lean animal proteins are best).  Fruits.  Vegetables.  Dairy products, such as milk, cheese, and yogurt (low fat is best).  Breads, grains, pasta, cereal, rice, and beans.  Fats such as olive oil, trans fat-free margarine, canola oil, avocado, and olives. DOES EVERYONE WITH DIABETES MELLITUS HAVE THE SAME MEAL PLAN? Because every person with diabetes mellitus is different, there is not one meal plan that works for everyone. It is very important that you meet with a dietitian who will help you create a meal plan that is just right for you. Document Released: 03/26/2005 Document Revised: 07/04/2013 Document Reviewed: 05/26/2013 Exit[employer] Patient Information 2015 [employer]. This   information is not intended to replace advice given to you by your health care provider. Make sure you discuss any questions you have with your health care provider.  

## 2014-09-24 NOTE — Progress Notes (Signed)
Patient here to establish care Patient has a history of diabetes and currently on metformin Patient is currently pregnant Takes gabapentin but has stopped taking until the birth of her baby

## 2014-09-24 NOTE — Progress Notes (Signed)
Patient Demographics  [name], is a 34 y.o. female  WGN:[id number]:[id number]  HQI:[id number]  DOB - 07/22/1980  CC:  Chief Complaint  Patient presents with  . Establish Care       HPI: [name] is a 34 y.o. female here today to establish medical care.the patient has history of diabetes and is on metformin, she also has a chronic left arm pain, she reported that [facility] , she alsois 3-4 months pregnant and is following with OB/GYN and was advised to continue with metformin, was on Neurontin apparently she was advised to discontinue because of pregnancy. Currently she denies any acute symptoms, today her hemoglobin A1c is 6.1% she is well controlled with her diabetes. Patient has No headache, No chest pain, No abdominal pain - No Nausea, No new weakness tingling or numbness, No Cough - SOB.  No Known Allergies Past Medical History  Diagnosis Date  . Diabetes mellitus without complication    Current Outpatient Prescriptions on File Prior to Visit  Medication Sig Dispense Refill  . HYDROcodone-acetaminophen (NORCO/VICODIN) 5-325 MG per tablet Take 1 tablet by mouth every 6 (six) hours as needed for pain.    Marland Kitchen. levofloxacin (LEVAQUIN) 500 MG tablet Take 1 tablet (500 mg total) by mouth daily. 7 tablet 0  . Pseudoeph-Doxylamine-DM-APAP (NYQUIL PO) Take 2 capsules by mouth at bedtime as needed (for cold symptoms).    . Pseudoephedrine-APAP-DM (DAYQUIL MULTI-SYMPTOM PO) Take 2 capsules by mouth every 6 (six) hours as needed (for cold symptoms).     No current facility-administered medications on file prior to visit.   History reviewed. No pertinent family history. History   Social History  . Marital Status: Single    Spouse Name: N/A  . Number of Children: N/A  . Years of Education: N/A   Occupational History  . Not on file.   Social History Main Topics  . Smoking status: Never Smoker   . Smokeless tobacco:  Not on file  . Alcohol Use: No  . Drug Use: No  . Sexual Activity: Not on file   Other Topics Concern  . Not on file   Social History Narrative    Review of Systems: Constitutional: Negative for fever, chills, diaphoresis, activity change, appetite change and fatigue. HENT: Negative for ear pain, nosebleeds, congestion, facial swelling, rhinorrhea, neck pain, neck stiffness and ear discharge.  Eyes: Negative for pain, discharge, redness, itching and visual disturbance. Respiratory: Negative for cough, choking, chest tightness, shortness of breath, wheezing and stridor.  Cardiovascular: Negative for chest pain, palpitations and leg swelling. Gastrointestinal: Negative for abdominal distention. Genitourinary: Negative for dysuria, urgency, frequency, hematuria, flank pain, decreased urine volume, difficulty urinating and dyspareunia.  Musculoskeletal: Negative for back pain, joint swelling, arthralgia and gait problem. Neurological: Negative for dizziness, tremors, seizures, syncope, facial asymmetry, speech difficulty, weakness, light-headedness, numbness and headaches.  Hematological: Negative for adenopathy. Does not bruise/bleed easily. Psychiatric/Behavioral: Negative for hallucinations, behavioral problems, confusion, dysphoric mood, decreased concentration and agitation.    Objective:   Filed Vitals:   09/24/14 1018  BP: 125/82  Pulse: 76  Temp: 98 F (36.7 C)  Resp: 16    Physical Exam: Constitutional: Patient appears well-developed and well-nourished. No distress. HENT: Normocephalic, atraumatic, External right and left ear normal. Oropharynx is clear and moist.  Eyes: Conjunctivae and EOM are normal. PERRLA, no scleral icterus. Neck: Normal ROM. Neck supple. No JVD. No tracheal deviation. No thyromegaly. CVS: RRR, S1/S2 +,  no murmurs, no gallops, no carotid bruit.  Pulmonary: Effort and breath sounds normal, no stridor, rhonchi, wheezes, rales.   Musculoskeletal:  Normal range of motion. No edema and no tenderness.  Neuro: Alert. Normal reflexes, muscle tone coordination. No cranial nerve deficit. Skin: Skin is warm and dry. No rash noted. Not diaphoretic. No erythema. No pallor. Psychiatric: Normal mood and affect. Behavior, judgment, thought content normal.  Lab Results  Component Value Date   WBC 9.4 08/11/2010   HGB 13.9 08/18/2012   HCT 41.0 08/18/2012   MCV 83.4 08/11/2010   PLT 228 08/11/2010   Lab Results  Component Value Date   CREATININE 0.50 08/18/2012   BUN 6 08/18/2012   NA 138 08/18/2012   K 4.0 08/18/2012   CL 105 08/18/2012   CO2 24 08/18/2012    Lab Results  Component Value Date   HGBA1C 6.10 09/24/2014   Lipid Panel  No results found for: CHOL, TRIG, HDL, CHOLHDL, VLDL, LDLCALC     Assessment and plan:   1. Other specified diabetes mellitus without complications Results for orders placed or performed in visit on 09/24/14  Glucose (CBG)  Result Value Ref Range   POC Glucose 216 (A) 70 - 99 mg/dl  HgB F6O  Result Value Ref Range   Hemoglobin A1C 6.10    Diabetes is well controlled continue with current meds, repeat A1c in 3 months. - Glucose (CBG) - HgB A1c - glucose blood test strip; Use as instructed  Dispense: 100 each; Refill: 12  2. Chronic pain of left upper extremity Currently following up with pain management.  3. Pregnancy Currently on multivitamins and iron supplement following up with OB/GYN, as per patient she has done blood work which she has not gotten the results back yet.     Health Maintenance  -Vaccinations:  patient declined flu shot  Return in about 3 months (around 12/25/2014) for diabetes.   The patient was given clear instructions to go to ER or return to medical center if symptoms don't improve, worsen or new problems develop. The patient verbalized understanding. The patient was told to call to get lab results if they haven't heard anything in the next week.    This note has  been created with Education officer, environmental. Any transcriptional errors are unintentional.   Doris Cheadle, MD

## 2014-09-25 ENCOUNTER — Other Ambulatory Visit (HOSPITAL_COMMUNITY): Payer: Self-pay | Admitting: Obstetrics

## 2014-09-25 DIAGNOSIS — IMO0002 Reserved for concepts with insufficient information to code with codable children: Secondary | ICD-10-CM

## 2014-09-25 DIAGNOSIS — O09522 Supervision of elderly multigravida, second trimester: Secondary | ICD-10-CM

## 2014-09-25 DIAGNOSIS — Z0489 Encounter for examination and observation for other specified reasons: Secondary | ICD-10-CM

## 2014-10-18 ENCOUNTER — Ambulatory Visit (HOSPITAL_COMMUNITY)
Admission: RE | Admit: 2014-10-18 | Discharge: 2014-10-18 | Disposition: A | Discharge status: Home / Self Care | Payer: Self-pay | Source: Ambulatory Visit | Attending: Obstetrics | Admitting: Obstetrics

## 2014-10-18 ENCOUNTER — Encounter (HOSPITAL_COMMUNITY): Payer: Self-pay

## 2014-10-18 ENCOUNTER — Other Ambulatory Visit (HOSPITAL_COMMUNITY): Payer: Self-pay | Admitting: Obstetrics and Gynecology

## 2014-10-18 ENCOUNTER — Other Ambulatory Visit (HOSPITAL_COMMUNITY): Payer: Self-pay | Admitting: Obstetrics

## 2014-10-18 DIAGNOSIS — O09522 Supervision of elderly multigravida, second trimester: Secondary | ICD-10-CM

## 2014-10-18 DIAGNOSIS — O09529 Supervision of elderly multigravida, unspecified trimester: Secondary | ICD-10-CM | POA: Insufficient documentation

## 2014-10-18 DIAGNOSIS — O24112 Pre-existing diabetes mellitus, type 2, in pregnancy, second trimester: Secondary | ICD-10-CM | POA: Insufficient documentation

## 2014-10-18 DIAGNOSIS — IMO0002 Reserved for concepts with insufficient information to code with codable children: Secondary | ICD-10-CM

## 2014-10-18 DIAGNOSIS — O24312 Unspecified pre-existing diabetes mellitus in pregnancy, second trimester: Secondary | ICD-10-CM

## 2014-10-18 DIAGNOSIS — Z3A18 18 weeks gestation of pregnancy: Secondary | ICD-10-CM | POA: Insufficient documentation

## 2014-10-18 DIAGNOSIS — E119 Type 2 diabetes mellitus without complications: Secondary | ICD-10-CM | POA: Insufficient documentation

## 2014-10-18 DIAGNOSIS — Z315 Encounter for genetic counseling: Secondary | ICD-10-CM | POA: Insufficient documentation

## 2014-10-18 DIAGNOSIS — Z0489 Encounter for examination and observation for other specified reasons: Secondary | ICD-10-CM

## 2014-10-18 DIAGNOSIS — Z3A22 22 weeks gestation of pregnancy: Secondary | ICD-10-CM

## 2014-10-18 HISTORY — DX: Gestational diabetes mellitus in pregnancy, unspecified control: O24.419

## 2014-10-18 NOTE — Consult Note (Signed)
MFM consult  34 yr old [id number] at 57w6dwith type II diabetes referred by Dr. MRuthann Cancerfor fetal anatomic survey and consult.  Ultrasound today shows: single intrauterine pregnancy. Fetal biometry is consistent with dating. Anterior fundal placenta without evidence of previa. Normal amniotic fluid volume. Normal transabdominal cervical length. The anatomy survey is limited as above; no abnormalities seen.  I counseled the patient as follows: 1. Appropriate fetal growth. 2. Limited anatomy survey: - recommend follow up in 4 weeks to complete anatomic survey 3. Advanced maternal age: - patient met with genetic counselor; see separate report - after counseling patient declined aneuploidy screening/testing 4. Diabetes: - on metformin - recommend diabetes education asap - Discussed increased risks in pregnancy include: fetal macrosomia, shoulder dystocia, and increased risk of requiring a Cesarean delivery. There is also an increased risk of developing preeclampsia during the pregnancy. There is an increased risk of congenital malformations related to level of diabetic control in the first trimester. I discussed there is an increased risk of stillbirth, neonatal hypoglycemia, neonatal jaundice, and neonatal electrolyte disturbances. I recommend strict glucose control maintaining fasting blood sugars <90 and 2 hour postprandial values <120. Patient does not have a sugar log for review. Is currently on metformin 5034mbid. Is only taking blood sugars twice a day but reports elevated fastings. Given minimal data to evaluate recommend she start checking sugars 4x/day and review blood sugars in 1 week to determine change in medication. Discussed she will likely need to increase metformin and/or start insulin. Prescription for test strips given. I recommend starting fetal kick counts- at [redacted] weeks gestation. I recommend starting antenatal testing with either weekly biophysical profiles or twice weekly  nonstress tests and weekly amniotic fluid index starting at 32 weeks.  I recommend following fetal growth every 4 weeks. I recommend delivery by estimated due date but not prior to 39 weeks in the absence of other complications. I recommend obtain baseline TSH, CBC, AST, ALT, BUN, creatinine, uric acid, 24 hour urine total protein, and EKG. The patient should have an Ophthamology exam if not recently performed. Recommend check hemoglobin A1C every trimester.  Given the increased risk of congenital anomalies recommend fetal echocardiogram as soon as possible- the referral was made. The anatomy survey is limited on today's exam- recommend follow up in 4 weeks to complete anatomy.  I spent a total of 30 minutes with the patient of which >50% was in face to face consultation.  Please call with questions.  KrElam CityMD

## 2014-10-18 NOTE — Progress Notes (Signed)
Genetic Counseling  High-Risk Gestation Note  Appointment Date:  10/18/2014 Referred By: Kathreen Cosier, MD Date of Birth:  02/07/1981   Pregnancy History: [id number] Estimated Date of Delivery: 03/15/15 Estimated Gestational Age: [redacted]w[redacted]d Attending: Eulis Foster, MD   Ms. Sarabella Haggart was seen for genetic counseling because of a maternal age of 34 y.o..     She was counseled regarding maternal age and the association with risk for chromosome conditions due to nondisjunction with aging of the ova.   We reviewed chromosomes, nondisjunction, and the associated 1 in 111 risk for fetal aneuploidy related to a maternal age of 34 y.o. at [redacted]w[redacted]d gestation.  She was counseled that the risk for aneuploidy decreases as gestational age increases, accounting for those pregnancies which spontaneously abort.  We specifically discussed Down syndrome (trisomy 76), trisomies 35 and 23, and sex chromosome aneuploidies (47,XXX and 47,XXY) including the common features and prognoses of each.   A complete ultrasound was performed today. The ultrasound report will be sent under separate cover. There were no visualized fetal anomalies or markers suggestive of aneuploidy. We reviewed additional available screening options including Quad screen, noninvasive prenatal screening (NIPS)/cell free DNA (cfDNA) testing, and detailed ultrasound.  She was counseled that screening tests are used to modify a patient's a priori risk for aneuploidy, typically based on age. This estimate provides a pregnancy specific risk assessment. We reviewed the benefits and limitations of each option. Specifically, we discussed the conditions for which each test screens, the detection rates, and false positive rates of each. She was also counseled regarding diagnostic testing via amniocentesis. We reviewed the approximate 1 in 300-500 risk for complications for amniocentesis, including spontaneous pregnancy loss. After consideration of all the options,  she declined additional screening and testing for fetal aneuploidy at this time including Quad screen, NIPS, and amniocentesis. She indicated that she was comfortable with the risk assessment currently provided for these conditions by maternal age and targeted ultrasound. She understands that screening tests cannot rule out all birth defects or genetic syndromes and that ultrasound does not diagnose or rule out chromosome conditions. Follow-up ultrasound was scheduled for 11/15/14.   Ms. Previti was provided with written information regarding sickle cell anemia (SCA) including the carrier frequency and incidence in the African population, the availability of carrier testing and prenatal diagnosis if indicated.  In addition, we discussed that hemoglobinopathies are routinely screened for as part of the [facility] newborn screening panel.  She declined additional discussion of this screening today.   Both family histories were reviewed and found to be noncontributory for birth defects, intellectual disability, and known genetic conditions. Without further information regarding the provided family history, an accurate genetic risk cannot be calculated. Further genetic counseling is warranted if more information is obtained.  The father of the pregnancy is reportedly older than 36 years old. Ms. Fess was unsure of his exact age. We discussed that advanced paternal age (APA) is defined as paternal age greater than or equal to age 65.  Recent large-scale sequencing studies have shown that approximately 80% of de novo point mutations are of paternal origin.  Many studies have demonstrated a strong correlation between increased paternal age and de novo point mutations.  Although no specific data is available regarding fetal risks for fathers 56+ years old at conception, it is apparent that the overall risk for single gene conditions is increased.  To estimate the relative increase in risk of a genetic disorder with APA,  the heritability of the  disease must be considered.  Assuming an approximate 2x increase in risk for conditions that are exclusively paternal in origin, the risk for each individual condition is still relatively low.  It is estimated that the overall chance for a de novo mutation is ~0.5%.  We also discussed the wide range of conditions which can be caused by new dominant gene mutations (achondroplasia, neurofibromatosis, Marfan syndrome etc.).  She was counseled that genetic testing for each individual single gene condition is not warranted or available unless ultrasound or family history concerns lend suspicion to a specific condition. We discussed the recommendation for a follow up ultrasound at ~28 weeks to monitor fetal growth.  Ms. Kern Albertahamadou denied exposure to environmental toxins or chemical agents. She denied the use of alcohol, tobacco or street drugs. She denied significant viral illnesses during the course of her pregnancy. Her medical and surgical histories were contributory for diabetes mellitus for which she is currently taking metformin. Ms. Strawder was also seen for MFM consultation today to further discuss management of diabetes in pregnancy. Please see separate MFM consult note for detailed discussion.    I counseled Ms. Makayleigh Stettner  regarding the above risks and available options.  The approximate face-to-face time with the genetic counselor was 35 minutes.  Quinn PlowmanKaren Sally-Anne Wamble, MS,  Certified Genetic Counselor 10/18/2014

## 2014-10-25 ENCOUNTER — Encounter: Payer: Self-pay | Attending: Obstetrics | Admitting: *Deleted

## 2014-10-25 ENCOUNTER — Ambulatory Visit (HOSPITAL_COMMUNITY)
Admission: RE | Admit: 2014-10-25 | Discharge: 2014-10-25 | Disposition: A | Discharge status: Home / Self Care | Payer: Self-pay | Source: Ambulatory Visit | Attending: Obstetrics | Admitting: Obstetrics

## 2014-10-25 DIAGNOSIS — O24419 Gestational diabetes mellitus in pregnancy, unspecified control: Secondary | ICD-10-CM | POA: Insufficient documentation

## 2014-10-25 DIAGNOSIS — Z713 Dietary counseling and surveillance: Secondary | ICD-10-CM | POA: Insufficient documentation

## 2014-10-25 NOTE — Progress Notes (Signed)
  Patient was seen on 10/25/14 for Gestational Diabetes self-management . Patient states she has a history of GDM with her last child. This did resolve after delivery.   The following learning objectives were met by the patient :   States the definition of Gestational Diabetes  States why dietary management is important in controlling blood glucose  Describes the effects of carbohydrates on blood glucose levels  Demonstrates ability to create a balanced meal plan  Demonstrates carbohydrate counting   States when to check blood glucose levels  Demonstrates proper blood glucose monitoring techniques  States the effect of stress and exercise on blood glucose levels  States the importance of limiting caffeine and abstaining from alcohol and smoking  Plan:  Aim for 2 Carb Choices per meal (30 grams) +/- 1 either way for breakfast Aim for 3 Carb Choices per meal (45 grams) +/- 1 either way from lunch and dinner Aim for 1-2 Carbs per snack Begin reading food labels for Total Carbohydrate and sugar grams of foods Consider  increasing your activity level by walking daily as tolerated Begin checking BG before breakfast and 2 hours after first bit of breakfast, lunch and dinner after  as directed by MD  Take medication  as directed by MD  Blood glucose monitor given: Patient already has meter and is testing four times per day. With no definitive schedule. FBS rage 98-120   PP range 96-125  Patient instructed to monitor glucose levels: FBS: 60 - <90 2 hour: <120  Patient received the following handouts:  Nutrition Diabetes and Pregnancy  Carbohydrate Counting List  Meal Planning worksheet  I will see patient in 1 week to review glucose readings and further evaluate medication regimen.

## 2014-11-01 ENCOUNTER — Ambulatory Visit (HOSPITAL_COMMUNITY)
Admission: RE | Admit: 2014-11-01 | Discharge: 2014-11-01 | Disposition: A | Discharge status: Home / Self Care | Payer: Self-pay | Source: Ambulatory Visit | Attending: Obstetrics | Admitting: Obstetrics

## 2014-11-01 ENCOUNTER — Encounter: Payer: Self-pay | Admitting: *Deleted

## 2014-11-01 DIAGNOSIS — E119 Type 2 diabetes mellitus without complications: Secondary | ICD-10-CM

## 2014-11-01 NOTE — Progress Notes (Signed)
Patient present to review glucose readings.FBS range 96-120, pp readings WNL. In discussion with Dr. Claudean SeveranceWhitecar we will increase medication to Metformin 500mg  AM 1000mg  PM. Patient states she has plenty of medication and refill available. Pt will be seen back on 5/5, 2016 for further evaluation.

## 2014-11-05 ENCOUNTER — Other Ambulatory Visit: Payer: Self-pay | Admitting: Infectious Disease

## 2014-11-05 ENCOUNTER — Ambulatory Visit
Admission: RE | Admit: 2014-11-05 | Discharge: 2014-11-05 | Disposition: A | Discharge status: Home / Self Care | Payer: No Typology Code available for payment source | Source: Ambulatory Visit | Attending: Infectious Disease | Admitting: Infectious Disease

## 2014-11-05 DIAGNOSIS — R7611 Nonspecific reaction to tuberculin skin test without active tuberculosis: Secondary | ICD-10-CM

## 2014-11-08 ENCOUNTER — Encounter (HOSPITAL_COMMUNITY): Payer: Self-pay | Admitting: Obstetrics

## 2014-11-15 ENCOUNTER — Encounter (HOSPITAL_COMMUNITY): Payer: Self-pay

## 2014-11-15 ENCOUNTER — Other Ambulatory Visit (HOSPITAL_COMMUNITY): Payer: Self-pay | Admitting: Maternal and Fetal Medicine

## 2014-11-15 ENCOUNTER — Ambulatory Visit (HOSPITAL_COMMUNITY)
Admission: RE | Admit: 2014-11-15 | Discharge: 2014-11-15 | Disposition: A | Discharge status: Home / Self Care | Payer: Self-pay | Source: Ambulatory Visit | Attending: Obstetrics | Admitting: Obstetrics

## 2014-11-15 DIAGNOSIS — O24313 Unspecified pre-existing diabetes mellitus in pregnancy, third trimester: Secondary | ICD-10-CM

## 2014-11-15 DIAGNOSIS — O09523 Supervision of elderly multigravida, third trimester: Secondary | ICD-10-CM

## 2014-11-15 DIAGNOSIS — Z3A26 26 weeks gestation of pregnancy: Secondary | ICD-10-CM | POA: Insufficient documentation

## 2014-11-15 DIAGNOSIS — O24112 Pre-existing diabetes mellitus, type 2, in pregnancy, second trimester: Secondary | ICD-10-CM | POA: Insufficient documentation

## 2014-11-15 DIAGNOSIS — Z3A22 22 weeks gestation of pregnancy: Secondary | ICD-10-CM

## 2014-11-15 DIAGNOSIS — E119 Type 2 diabetes mellitus without complications: Secondary | ICD-10-CM | POA: Insufficient documentation

## 2014-11-15 DIAGNOSIS — O24312 Unspecified pre-existing diabetes mellitus in pregnancy, second trimester: Secondary | ICD-10-CM

## 2014-11-15 DIAGNOSIS — O09522 Supervision of elderly multigravida, second trimester: Secondary | ICD-10-CM | POA: Insufficient documentation

## 2014-11-16 ENCOUNTER — Other Ambulatory Visit (HOSPITAL_COMMUNITY): Payer: Self-pay | Admitting: Obstetrics

## 2014-12-13 ENCOUNTER — Other Ambulatory Visit (HOSPITAL_COMMUNITY): Payer: Self-pay | Admitting: Obstetrics

## 2014-12-13 ENCOUNTER — Ambulatory Visit (HOSPITAL_COMMUNITY)
Admission: RE | Admit: 2014-12-13 | Discharge: 2014-12-13 | Disposition: A | Discharge status: Home / Self Care | Payer: Self-pay | Source: Ambulatory Visit | Attending: Internal Medicine | Admitting: Internal Medicine

## 2014-12-13 ENCOUNTER — Encounter (HOSPITAL_COMMUNITY): Payer: Self-pay

## 2014-12-13 DIAGNOSIS — E119 Type 2 diabetes mellitus without complications: Secondary | ICD-10-CM | POA: Insufficient documentation

## 2014-12-13 DIAGNOSIS — O09522 Supervision of elderly multigravida, second trimester: Secondary | ICD-10-CM | POA: Insufficient documentation

## 2014-12-13 DIAGNOSIS — O24312 Unspecified pre-existing diabetes mellitus in pregnancy, second trimester: Secondary | ICD-10-CM | POA: Insufficient documentation

## 2014-12-13 DIAGNOSIS — O24313 Unspecified pre-existing diabetes mellitus in pregnancy, third trimester: Secondary | ICD-10-CM

## 2014-12-13 DIAGNOSIS — O09523 Supervision of elderly multigravida, third trimester: Secondary | ICD-10-CM

## 2014-12-13 DIAGNOSIS — Z3A26 26 weeks gestation of pregnancy: Secondary | ICD-10-CM | POA: Insufficient documentation

## 2014-12-13 NOTE — ED Notes (Signed)
Pt reports having a headache. 

## 2014-12-13 NOTE — ED Notes (Signed)
Blood sugar log, copied, scanned to chart and rev'd by MD.

## 2015-01-09 ENCOUNTER — Other Ambulatory Visit (HOSPITAL_COMMUNITY): Payer: Self-pay | Admitting: Obstetrics

## 2015-01-09 DIAGNOSIS — O24319 Unspecified pre-existing diabetes mellitus in pregnancy, unspecified trimester: Secondary | ICD-10-CM

## 2015-01-09 DIAGNOSIS — O09523 Supervision of elderly multigravida, third trimester: Secondary | ICD-10-CM

## 2015-01-09 DIAGNOSIS — Z3A29 29 weeks gestation of pregnancy: Secondary | ICD-10-CM

## 2015-01-10 ENCOUNTER — Ambulatory Visit (HOSPITAL_COMMUNITY)
Admission: RE | Admit: 2015-01-10 | Discharge: 2015-01-10 | Disposition: A | Discharge status: Home / Self Care | Payer: Self-pay | Source: Ambulatory Visit | Attending: Obstetrics | Admitting: Obstetrics

## 2015-01-10 ENCOUNTER — Encounter (HOSPITAL_COMMUNITY): Payer: Self-pay

## 2015-01-10 DIAGNOSIS — E119 Type 2 diabetes mellitus without complications: Secondary | ICD-10-CM | POA: Insufficient documentation

## 2015-01-10 DIAGNOSIS — O24313 Unspecified pre-existing diabetes mellitus in pregnancy, third trimester: Secondary | ICD-10-CM | POA: Insufficient documentation

## 2015-01-10 DIAGNOSIS — O24319 Unspecified pre-existing diabetes mellitus in pregnancy, unspecified trimester: Secondary | ICD-10-CM | POA: Insufficient documentation

## 2015-01-10 DIAGNOSIS — Z3A30 30 weeks gestation of pregnancy: Secondary | ICD-10-CM | POA: Insufficient documentation

## 2015-01-10 DIAGNOSIS — O09529 Supervision of elderly multigravida, unspecified trimester: Secondary | ICD-10-CM | POA: Insufficient documentation

## 2015-01-10 DIAGNOSIS — Z3A29 29 weeks gestation of pregnancy: Secondary | ICD-10-CM

## 2015-01-10 DIAGNOSIS — O09523 Supervision of elderly multigravida, third trimester: Secondary | ICD-10-CM | POA: Insufficient documentation

## 2015-01-16 ENCOUNTER — Other Ambulatory Visit (HOSPITAL_COMMUNITY): Payer: Self-pay | Admitting: Maternal and Fetal Medicine

## 2015-01-16 DIAGNOSIS — O24313 Unspecified pre-existing diabetes mellitus in pregnancy, third trimester: Secondary | ICD-10-CM

## 2015-01-16 DIAGNOSIS — Z3A31 31 weeks gestation of pregnancy: Secondary | ICD-10-CM

## 2015-01-16 DIAGNOSIS — O09523 Supervision of elderly multigravida, third trimester: Secondary | ICD-10-CM

## 2015-01-18 ENCOUNTER — Ambulatory Visit (HOSPITAL_COMMUNITY)
Admission: RE | Admit: 2015-01-18 | Discharge: 2015-01-18 | Disposition: A | Discharge status: Home / Self Care | Payer: Self-pay | Source: Ambulatory Visit | Attending: Obstetrics | Admitting: Obstetrics

## 2015-01-18 DIAGNOSIS — O24313 Unspecified pre-existing diabetes mellitus in pregnancy, third trimester: Secondary | ICD-10-CM | POA: Insufficient documentation

## 2015-01-18 DIAGNOSIS — Z3A31 31 weeks gestation of pregnancy: Secondary | ICD-10-CM | POA: Insufficient documentation

## 2015-01-18 DIAGNOSIS — O09523 Supervision of elderly multigravida, third trimester: Secondary | ICD-10-CM | POA: Insufficient documentation

## 2015-01-18 DIAGNOSIS — Z3A32 32 weeks gestation of pregnancy: Secondary | ICD-10-CM | POA: Insufficient documentation

## 2015-01-22 ENCOUNTER — Ambulatory Visit (HOSPITAL_COMMUNITY)
Admission: RE | Admit: 2015-01-22 | Discharge: 2015-01-22 | Disposition: A | Discharge status: Home / Self Care | Payer: Self-pay | Source: Ambulatory Visit | Attending: Obstetrics | Admitting: Obstetrics

## 2015-01-22 ENCOUNTER — Encounter (HOSPITAL_COMMUNITY): Payer: Self-pay

## 2015-01-22 DIAGNOSIS — Z3A32 32 weeks gestation of pregnancy: Secondary | ICD-10-CM | POA: Insufficient documentation

## 2015-01-22 DIAGNOSIS — O24419 Gestational diabetes mellitus in pregnancy, unspecified control: Secondary | ICD-10-CM | POA: Insufficient documentation

## 2015-01-22 NOTE — ED Notes (Signed)
Blood sugar log copied, scanned to chart and reviewed by Dr. Claudean SeveranceWhitecar.

## 2015-01-22 NOTE — Progress Notes (Signed)
MFM note 34 yo [id number] at 32w 4d - GDM on Metformin BP: 111/59, Wt: 220 lbs Reactive NST  Blood sugars reviewed - some mildly elevated fasting values; the remainder of the values within target range  Continue 2x weekly NSTs and weekly AFIs.  Alpha GulaPaul Catelynn Sparger, MD

## 2015-01-23 ENCOUNTER — Other Ambulatory Visit (HOSPITAL_COMMUNITY): Payer: Self-pay | Admitting: Obstetrics

## 2015-01-25 ENCOUNTER — Ambulatory Visit (HOSPITAL_COMMUNITY)
Admission: RE | Admit: 2015-01-25 | Discharge: 2015-01-25 | Disposition: A | Discharge status: Home / Self Care | Payer: Self-pay | Source: Ambulatory Visit | Attending: Obstetrics | Admitting: Obstetrics

## 2015-01-25 ENCOUNTER — Encounter (HOSPITAL_COMMUNITY): Payer: Self-pay

## 2015-01-25 DIAGNOSIS — Z3A31 31 weeks gestation of pregnancy: Secondary | ICD-10-CM | POA: Insufficient documentation

## 2015-01-25 DIAGNOSIS — O24313 Unspecified pre-existing diabetes mellitus in pregnancy, third trimester: Secondary | ICD-10-CM

## 2015-01-25 DIAGNOSIS — O24319 Unspecified pre-existing diabetes mellitus in pregnancy, unspecified trimester: Secondary | ICD-10-CM

## 2015-01-25 DIAGNOSIS — O09523 Supervision of elderly multigravida, third trimester: Secondary | ICD-10-CM

## 2015-01-25 DIAGNOSIS — Z3A33 33 weeks gestation of pregnancy: Secondary | ICD-10-CM | POA: Insufficient documentation

## 2015-01-25 NOTE — ED Notes (Signed)
See media tab for blood sugar logs.

## 2015-01-28 ENCOUNTER — Other Ambulatory Visit (HOSPITAL_COMMUNITY): Payer: Self-pay | Admitting: Maternal and Fetal Medicine

## 2015-01-28 ENCOUNTER — Other Ambulatory Visit (HOSPITAL_COMMUNITY): Payer: Self-pay | Admitting: Obstetrics

## 2015-01-28 DIAGNOSIS — O09293 Supervision of pregnancy with other poor reproductive or obstetric history, third trimester: Secondary | ICD-10-CM

## 2015-01-28 DIAGNOSIS — O09523 Supervision of elderly multigravida, third trimester: Secondary | ICD-10-CM

## 2015-01-28 DIAGNOSIS — Z8632 Personal history of gestational diabetes: Principal | ICD-10-CM

## 2015-01-29 ENCOUNTER — Ambulatory Visit (HOSPITAL_COMMUNITY)
Admission: RE | Admit: 2015-01-29 | Discharge: 2015-01-29 | Disposition: A | Discharge status: Home / Self Care | Payer: Self-pay | Source: Ambulatory Visit | Attending: Obstetrics | Admitting: Obstetrics

## 2015-01-29 DIAGNOSIS — Z3A34 34 weeks gestation of pregnancy: Secondary | ICD-10-CM | POA: Insufficient documentation

## 2015-01-29 DIAGNOSIS — O09523 Supervision of elderly multigravida, third trimester: Secondary | ICD-10-CM | POA: Insufficient documentation

## 2015-01-29 DIAGNOSIS — O09529 Supervision of elderly multigravida, unspecified trimester: Secondary | ICD-10-CM | POA: Insufficient documentation

## 2015-01-29 DIAGNOSIS — O24313 Unspecified pre-existing diabetes mellitus in pregnancy, third trimester: Secondary | ICD-10-CM | POA: Insufficient documentation

## 2015-02-01 ENCOUNTER — Encounter (HOSPITAL_COMMUNITY): Payer: Self-pay

## 2015-02-01 ENCOUNTER — Ambulatory Visit (HOSPITAL_COMMUNITY)
Admission: RE | Admit: 2015-02-01 | Discharge: 2015-02-01 | Disposition: A | Discharge status: Home / Self Care | Payer: Self-pay | Source: Ambulatory Visit | Attending: Obstetrics | Admitting: Obstetrics

## 2015-02-01 DIAGNOSIS — Z3A34 34 weeks gestation of pregnancy: Secondary | ICD-10-CM | POA: Insufficient documentation

## 2015-02-01 DIAGNOSIS — O09523 Supervision of elderly multigravida, third trimester: Secondary | ICD-10-CM | POA: Insufficient documentation

## 2015-02-01 DIAGNOSIS — Z8632 Personal history of gestational diabetes: Principal | ICD-10-CM

## 2015-02-01 DIAGNOSIS — O24313 Unspecified pre-existing diabetes mellitus in pregnancy, third trimester: Secondary | ICD-10-CM | POA: Insufficient documentation

## 2015-02-01 DIAGNOSIS — O09293 Supervision of pregnancy with other poor reproductive or obstetric history, third trimester: Secondary | ICD-10-CM

## 2015-02-01 DIAGNOSIS — O24419 Gestational diabetes mellitus in pregnancy, unspecified control: Secondary | ICD-10-CM | POA: Insufficient documentation

## 2015-02-05 ENCOUNTER — Other Ambulatory Visit (HOSPITAL_COMMUNITY): Payer: Self-pay | Admitting: Obstetrics

## 2015-02-05 ENCOUNTER — Ambulatory Visit (HOSPITAL_COMMUNITY): Payer: Self-pay | Attending: Obstetrics

## 2015-02-08 ENCOUNTER — Encounter (HOSPITAL_COMMUNITY): Payer: Self-pay

## 2015-02-08 ENCOUNTER — Ambulatory Visit (HOSPITAL_COMMUNITY)
Admission: RE | Admit: 2015-02-08 | Discharge: 2015-02-08 | Disposition: A | Discharge status: Home / Self Care | Payer: Self-pay | Source: Ambulatory Visit | Attending: Obstetrics | Admitting: Obstetrics

## 2015-02-08 ENCOUNTER — Other Ambulatory Visit (HOSPITAL_COMMUNITY): Payer: Self-pay | Admitting: Maternal and Fetal Medicine

## 2015-02-08 DIAGNOSIS — O24313 Unspecified pre-existing diabetes mellitus in pregnancy, third trimester: Secondary | ICD-10-CM | POA: Insufficient documentation

## 2015-02-08 DIAGNOSIS — O09523 Supervision of elderly multigravida, third trimester: Secondary | ICD-10-CM

## 2015-02-08 DIAGNOSIS — Z8632 Personal history of gestational diabetes: Principal | ICD-10-CM

## 2015-02-08 DIAGNOSIS — Z3A35 35 weeks gestation of pregnancy: Secondary | ICD-10-CM | POA: Insufficient documentation

## 2015-02-08 DIAGNOSIS — O24419 Gestational diabetes mellitus in pregnancy, unspecified control: Secondary | ICD-10-CM | POA: Insufficient documentation

## 2015-02-08 DIAGNOSIS — O09293 Supervision of pregnancy with other poor reproductive or obstetric history, third trimester: Secondary | ICD-10-CM

## 2015-02-08 NOTE — ED Notes (Signed)
Blood sugar log copied and scanned to chart, reviewed by MD.

## 2015-02-12 ENCOUNTER — Ambulatory Visit (HOSPITAL_COMMUNITY)
Admission: RE | Admit: 2015-02-12 | Discharge: 2015-02-12 | Disposition: A | Discharge status: Home / Self Care | Payer: Medicaid Other | Source: Ambulatory Visit | Attending: Obstetrics | Admitting: Obstetrics

## 2015-02-12 ENCOUNTER — Encounter (HOSPITAL_COMMUNITY): Payer: Self-pay

## 2015-02-12 DIAGNOSIS — O24313 Unspecified pre-existing diabetes mellitus in pregnancy, third trimester: Secondary | ICD-10-CM | POA: Insufficient documentation

## 2015-02-12 DIAGNOSIS — O09523 Supervision of elderly multigravida, third trimester: Secondary | ICD-10-CM | POA: Insufficient documentation

## 2015-02-13 ENCOUNTER — Other Ambulatory Visit (HOSPITAL_COMMUNITY): Payer: Self-pay | Admitting: Obstetrics

## 2015-02-15 ENCOUNTER — Ambulatory Visit (HOSPITAL_COMMUNITY)
Admission: RE | Admit: 2015-02-15 | Discharge: 2015-02-15 | Disposition: A | Discharge status: Home / Self Care | Payer: Medicaid Other | Source: Ambulatory Visit | Attending: Obstetrics | Admitting: Obstetrics

## 2015-02-15 ENCOUNTER — Other Ambulatory Visit (HOSPITAL_COMMUNITY): Payer: Self-pay | Admitting: Maternal and Fetal Medicine

## 2015-02-15 ENCOUNTER — Encounter (HOSPITAL_COMMUNITY): Payer: Self-pay

## 2015-02-15 DIAGNOSIS — O09523 Supervision of elderly multigravida, third trimester: Secondary | ICD-10-CM

## 2015-02-15 DIAGNOSIS — O09293 Supervision of pregnancy with other poor reproductive or obstetric history, third trimester: Secondary | ICD-10-CM

## 2015-02-15 DIAGNOSIS — Z8632 Personal history of gestational diabetes: Principal | ICD-10-CM

## 2015-02-18 ENCOUNTER — Other Ambulatory Visit (HOSPITAL_COMMUNITY): Payer: Self-pay | Admitting: Obstetrics

## 2015-02-18 DIAGNOSIS — Z3A38 38 weeks gestation of pregnancy: Secondary | ICD-10-CM

## 2015-02-18 DIAGNOSIS — Z3A37 37 weeks gestation of pregnancy: Secondary | ICD-10-CM

## 2015-02-18 DIAGNOSIS — O24319 Unspecified pre-existing diabetes mellitus in pregnancy, unspecified trimester: Secondary | ICD-10-CM

## 2015-02-18 DIAGNOSIS — O09523 Supervision of elderly multigravida, third trimester: Secondary | ICD-10-CM

## 2015-02-19 ENCOUNTER — Ambulatory Visit (HOSPITAL_COMMUNITY)
Admission: RE | Admit: 2015-02-19 | Discharge: 2015-02-19 | Disposition: A | Discharge status: Home / Self Care | Payer: Medicaid Other | Source: Ambulatory Visit | Attending: Obstetrics | Admitting: Obstetrics

## 2015-02-19 ENCOUNTER — Encounter (HOSPITAL_COMMUNITY): Payer: Self-pay

## 2015-02-19 ENCOUNTER — Ambulatory Visit (HOSPITAL_COMMUNITY): Payer: Self-pay

## 2015-02-19 DIAGNOSIS — Z79899 Other long term (current) drug therapy: Secondary | ICD-10-CM | POA: Diagnosis not present

## 2015-02-19 DIAGNOSIS — O09523 Supervision of elderly multigravida, third trimester: Secondary | ICD-10-CM | POA: Insufficient documentation

## 2015-02-19 DIAGNOSIS — O24419 Gestational diabetes mellitus in pregnancy, unspecified control: Secondary | ICD-10-CM | POA: Diagnosis not present

## 2015-02-19 DIAGNOSIS — Z3A36 36 weeks gestation of pregnancy: Secondary | ICD-10-CM | POA: Insufficient documentation

## 2015-02-19 NOTE — Progress Notes (Signed)
NST note  34 yo [id number] currently at 36w 4d with A2 GDM on Metformin.  221 lbs, 102/69, 85 Reactive NST- fetal heart rate 130-140 bpm No contractions  Alpha Gula, MD Maternal Fetal Medicine

## 2015-02-22 ENCOUNTER — Encounter (HOSPITAL_COMMUNITY): Payer: Self-pay

## 2015-02-22 ENCOUNTER — Ambulatory Visit (HOSPITAL_COMMUNITY)
Admission: RE | Admit: 2015-02-22 | Discharge: 2015-02-22 | Disposition: A | Discharge status: Home / Self Care | Payer: Medicaid Other | Source: Ambulatory Visit | Attending: Obstetrics | Admitting: Obstetrics

## 2015-02-22 ENCOUNTER — Other Ambulatory Visit (HOSPITAL_COMMUNITY): Payer: Self-pay | Admitting: Obstetrics

## 2015-02-22 DIAGNOSIS — E119 Type 2 diabetes mellitus without complications: Secondary | ICD-10-CM | POA: Insufficient documentation

## 2015-02-22 DIAGNOSIS — O09523 Supervision of elderly multigravida, third trimester: Secondary | ICD-10-CM | POA: Insufficient documentation

## 2015-02-22 DIAGNOSIS — O24319 Unspecified pre-existing diabetes mellitus in pregnancy, unspecified trimester: Secondary | ICD-10-CM | POA: Insufficient documentation

## 2015-02-22 DIAGNOSIS — Z3A37 37 weeks gestation of pregnancy: Secondary | ICD-10-CM | POA: Diagnosis not present

## 2015-02-26 ENCOUNTER — Encounter (HOSPITAL_COMMUNITY): Payer: Self-pay

## 2015-02-26 ENCOUNTER — Ambulatory Visit (HOSPITAL_COMMUNITY)
Admission: RE | Admit: 2015-02-26 | Discharge: 2015-02-26 | Disposition: A | Discharge status: Home / Self Care | Payer: Medicaid Other | Source: Ambulatory Visit | Attending: Obstetrics | Admitting: Obstetrics

## 2015-02-26 ENCOUNTER — Ambulatory Visit (HOSPITAL_COMMUNITY): Payer: Self-pay

## 2015-02-26 DIAGNOSIS — O09523 Supervision of elderly multigravida, third trimester: Secondary | ICD-10-CM | POA: Diagnosis not present

## 2015-02-26 DIAGNOSIS — O24313 Unspecified pre-existing diabetes mellitus in pregnancy, third trimester: Secondary | ICD-10-CM | POA: Diagnosis present

## 2015-02-26 DIAGNOSIS — Z3A38 38 weeks gestation of pregnancy: Secondary | ICD-10-CM | POA: Diagnosis not present

## 2015-03-01 ENCOUNTER — Ambulatory Visit (HOSPITAL_COMMUNITY)
Admission: RE | Admit: 2015-03-01 | Discharge: 2015-03-01 | Disposition: A | Discharge status: Home / Self Care | Payer: Medicaid Other | Source: Ambulatory Visit | Attending: Obstetrics | Admitting: Obstetrics

## 2015-03-01 ENCOUNTER — Other Ambulatory Visit (HOSPITAL_COMMUNITY): Payer: Self-pay | Admitting: Obstetrics

## 2015-03-01 DIAGNOSIS — O24319 Unspecified pre-existing diabetes mellitus in pregnancy, unspecified trimester: Secondary | ICD-10-CM | POA: Diagnosis not present

## 2015-03-01 DIAGNOSIS — O09523 Supervision of elderly multigravida, third trimester: Secondary | ICD-10-CM | POA: Diagnosis not present

## 2015-03-01 DIAGNOSIS — Z3A38 38 weeks gestation of pregnancy: Secondary | ICD-10-CM | POA: Diagnosis not present

## 2015-03-01 DIAGNOSIS — E119 Type 2 diabetes mellitus without complications: Secondary | ICD-10-CM | POA: Insufficient documentation

## 2015-03-04 ENCOUNTER — Inpatient Hospital Stay (HOSPITAL_COMMUNITY)
Admission: AD | Admit: 2015-03-04 | Discharge: 2015-03-07 | DRG: 766 | Disposition: A | Discharge status: Home / Self Care | Payer: Medicaid Other | Source: Ambulatory Visit | Attending: Obstetrics | Admitting: Obstetrics

## 2015-03-04 ENCOUNTER — Inpatient Hospital Stay (HOSPITAL_COMMUNITY): Payer: Medicaid Other | Admitting: Anesthesiology

## 2015-03-04 ENCOUNTER — Other Ambulatory Visit (HOSPITAL_COMMUNITY): Payer: Self-pay | Admitting: Obstetrics

## 2015-03-04 ENCOUNTER — Encounter (HOSPITAL_COMMUNITY): Payer: Self-pay | Admitting: *Deleted

## 2015-03-04 ENCOUNTER — Encounter (HOSPITAL_COMMUNITY)
Admission: AD | Disposition: A | Discharge status: Home / Self Care | Payer: Self-pay | Source: Ambulatory Visit | Attending: Obstetrics

## 2015-03-04 DIAGNOSIS — O3663X0 Maternal care for excessive fetal growth, third trimester, not applicable or unspecified: Secondary | ICD-10-CM | POA: Diagnosis present

## 2015-03-04 DIAGNOSIS — Z3A38 38 weeks gestation of pregnancy: Secondary | ICD-10-CM | POA: Diagnosis present

## 2015-03-04 DIAGNOSIS — O24420 Gestational diabetes mellitus in childbirth, diet controlled: Secondary | ICD-10-CM | POA: Diagnosis present

## 2015-03-04 DIAGNOSIS — O24429 Gestational diabetes mellitus in childbirth, unspecified control: Principal | ICD-10-CM | POA: Diagnosis present

## 2015-03-04 DIAGNOSIS — Z6838 Body mass index (BMI) 38.0-38.9, adult: Secondary | ICD-10-CM

## 2015-03-04 DIAGNOSIS — O99214 Obesity complicating childbirth: Secondary | ICD-10-CM | POA: Diagnosis present

## 2015-03-04 DIAGNOSIS — Z98891 History of uterine scar from previous surgery: Secondary | ICD-10-CM

## 2015-03-04 LAB — CBC
HCT: 35.1 % — ABNORMAL LOW (ref 36.0–46.0)
Hemoglobin: 11.7 g/dL — ABNORMAL LOW (ref 12.0–15.0)
MCH: 29.1 pg (ref 26.0–34.0)
MCHC: 33.3 g/dL (ref 30.0–36.0)
MCV: 87.3 fL (ref 78.0–100.0)
Platelets: 157 10*3/uL (ref 150–400)
RBC: 4.02 MIL/uL (ref 3.87–5.11)
RDW: 13.9 % (ref 11.5–15.5)
WBC: 7.4 10*3/uL (ref 4.0–10.5)

## 2015-03-04 LAB — TYPE AND SCREEN
ABO/RH(D): O POS
Antibody Screen: NEGATIVE

## 2015-03-04 LAB — GLUCOSE, CAPILLARY: GLUCOSE-CAPILLARY: 120 mg/dL — AB (ref 65–99)

## 2015-03-04 LAB — RAPID HIV SCREEN (HIV 1/2 AB+AG)
HIV 1/2 ANTIBODIES: NONREACTIVE
HIV-1 P24 ANTIGEN - HIV24: NONREACTIVE

## 2015-03-04 LAB — ABO/RH: ABO/RH(D): O POS

## 2015-03-04 SURGERY — Surgical Case
Anesthesia: Spinal

## 2015-03-04 MED ORDER — NALBUPHINE HCL 10 MG/ML IJ SOLN: 5.0000 mg | Freq: Once | INTRAMUSCULAR | Status: DC | PRN

## 2015-03-04 MED ORDER — SIMETHICONE 80 MG PO CHEW
80.0000 mg | CHEWABLE_TABLET | Freq: Three times a day (TID) | ORAL | Status: DC
  Administered 2015-03-05 – 2015-03-07 (×8): 80 mg via ORAL
  Filled 2015-03-04 (×9): qty 1

## 2015-03-04 MED ORDER — OXYCODONE-ACETAMINOPHEN 5-325 MG PO TABS
1.0000 | ORAL_TABLET | ORAL | Status: DC | PRN
  Administered 2015-03-05: 1 via ORAL
  Filled 2015-03-04: qty 1

## 2015-03-04 MED ORDER — WITCH HAZEL-GLYCERIN EX PADS: 1.0000 "application " | MEDICATED_PAD | CUTANEOUS | Status: DC | PRN

## 2015-03-04 MED ORDER — NALBUPHINE HCL 10 MG/ML IJ SOLN: 5.0000 mg | INTRAMUSCULAR | Status: DC | PRN

## 2015-03-04 MED ORDER — ZOLPIDEM TARTRATE 5 MG PO TABS: 5.0000 mg | ORAL_TABLET | Freq: Every evening | ORAL | Status: DC | PRN

## 2015-03-04 MED ORDER — FENTANYL CITRATE (PF) 100 MCG/2ML IJ SOLN
INTRAMUSCULAR | Status: DC | PRN
  Administered 2015-03-04: 20 ug via INTRAVENOUS

## 2015-03-04 MED ORDER — FAMOTIDINE IN NACL 20-0.9 MG/50ML-% IV SOLN
20.0000 mg | Freq: Once | INTRAVENOUS | Status: AC
  Administered 2015-03-04: 20 mg via INTRAVENOUS

## 2015-03-04 MED ORDER — ONDANSETRON HCL 4 MG/2ML IJ SOLN
INTRAMUSCULAR | Status: DC | PRN
  Administered 2015-03-04: 4 mg via INTRAVENOUS

## 2015-03-04 MED ORDER — CEFAZOLIN SODIUM-DEXTROSE 2-3 GM-% IV SOLR
INTRAVENOUS | Status: AC
  Filled 2015-03-04: qty 50

## 2015-03-04 MED ORDER — LANOLIN HYDROUS EX OINT: 1.0000 "application " | TOPICAL_OINTMENT | CUTANEOUS | Status: DC | PRN

## 2015-03-04 MED ORDER — SIMETHICONE 80 MG PO CHEW
80.0000 mg | CHEWABLE_TABLET | ORAL | Status: DC
  Administered 2015-03-04 – 2015-03-06 (×3): 80 mg via ORAL
  Filled 2015-03-04 (×3): qty 1

## 2015-03-04 MED ORDER — FENTANYL CITRATE (PF) 100 MCG/2ML IJ SOLN
INTRAMUSCULAR | Status: AC
  Filled 2015-03-04: qty 4

## 2015-03-04 MED ORDER — LACTATED RINGERS IV SOLN
INTRAVENOUS | Status: DC | PRN
  Administered 2015-03-04 (×4): via INTRAVENOUS

## 2015-03-04 MED ORDER — ONDANSETRON HCL 4 MG/2ML IJ SOLN: 4.0000 mg | Freq: Once | INTRAMUSCULAR | Status: DC | PRN

## 2015-03-04 MED ORDER — OXYTOCIN 10 UNIT/ML IJ SOLN
INTRAMUSCULAR | Status: DC | PRN
  Administered 2015-03-04: 40 [IU] via INTRAMUSCULAR

## 2015-03-04 MED ORDER — SCOPOLAMINE 1 MG/3DAYS TD PT72
MEDICATED_PATCH | TRANSDERMAL | Status: DC | PRN
  Administered 2015-03-04: 1 via TRANSDERMAL

## 2015-03-04 MED ORDER — SODIUM CHLORIDE 0.9 % IJ SOLN: 3.0000 mL | INTRAMUSCULAR | Status: DC | PRN

## 2015-03-04 MED ORDER — MENTHOL 3 MG MT LOZG: 1.0000 | LOZENGE | OROMUCOSAL | Status: DC | PRN

## 2015-03-04 MED ORDER — SIMETHICONE 80 MG PO CHEW: 80.0000 mg | CHEWABLE_TABLET | ORAL | Status: DC | PRN

## 2015-03-04 MED ORDER — FENTANYL CITRATE (PF) 100 MCG/2ML IJ SOLN: 25.0000 ug | INTRAMUSCULAR | Status: DC | PRN

## 2015-03-04 MED ORDER — LACTATED RINGERS IV SOLN
INTRAVENOUS | Status: DC
  Administered 2015-03-05: via INTRAVENOUS

## 2015-03-04 MED ORDER — OXYCODONE-ACETAMINOPHEN 5-325 MG PO TABS
2.0000 | ORAL_TABLET | ORAL | Status: DC | PRN
  Administered 2015-03-05 – 2015-03-07 (×7): 2 via ORAL
  Filled 2015-03-04 (×7): qty 2

## 2015-03-04 MED ORDER — SCOPOLAMINE 1 MG/3DAYS TD PT72: 1.0000 | MEDICATED_PATCH | Freq: Once | TRANSDERMAL | Status: DC

## 2015-03-04 MED ORDER — MEPERIDINE HCL 25 MG/ML IJ SOLN: 6.2500 mg | INTRAMUSCULAR | Status: DC | PRN

## 2015-03-04 MED ORDER — MORPHINE SULFATE (PF) 0.5 MG/ML IJ SOLN
INTRAMUSCULAR | Status: DC | PRN
  Administered 2015-03-04: .2 mg via EPIDURAL

## 2015-03-04 MED ORDER — ONDANSETRON HCL 4 MG/2ML IJ SOLN: 4.0000 mg | Freq: Three times a day (TID) | INTRAMUSCULAR | Status: DC | PRN

## 2015-03-04 MED ORDER — PHENYLEPHRINE 8 MG IN D5W 100 ML (0.08MG/ML) PREMIX OPTIME
INJECTION | INTRAVENOUS | Status: AC
  Filled 2015-03-04: qty 100

## 2015-03-04 MED ORDER — MORPHINE SULFATE 0.5 MG/ML IJ SOLN
INTRAMUSCULAR | Status: AC
  Filled 2015-03-04: qty 100

## 2015-03-04 MED ORDER — OXYTOCIN 10 UNIT/ML IJ SOLN
INTRAMUSCULAR | Status: AC
  Filled 2015-03-04: qty 4

## 2015-03-04 MED ORDER — NALOXONE HCL 0.4 MG/ML IJ SOLN: 0.4000 mg | INTRAMUSCULAR | Status: DC | PRN

## 2015-03-04 MED ORDER — TETANUS-DIPHTH-ACELL PERTUSSIS 5-2.5-18.5 LF-MCG/0.5 IM SUSP
0.5000 mL | Freq: Once | INTRAMUSCULAR | Status: AC
  Administered 2015-03-06: 0.5 mL via INTRAMUSCULAR
  Filled 2015-03-04: qty 0.5

## 2015-03-04 MED ORDER — DIPHENHYDRAMINE HCL 25 MG PO CAPS: 25.0000 mg | ORAL_CAPSULE | Freq: Four times a day (QID) | ORAL | Status: DC | PRN

## 2015-03-04 MED ORDER — LACTATED RINGERS IV SOLN
INTRAVENOUS | Status: DC | PRN
  Administered 2015-03-04 (×2): via INTRAVENOUS

## 2015-03-04 MED ORDER — BUPIVACAINE IN DEXTROSE 0.75-8.25 % IT SOLN
INTRATHECAL | Status: DC | PRN
  Administered 2015-03-04: 1.8 mL via INTRATHECAL

## 2015-03-04 MED ORDER — SENNOSIDES-DOCUSATE SODIUM 8.6-50 MG PO TABS
2.0000 | ORAL_TABLET | ORAL | Status: DC
  Administered 2015-03-04 – 2015-03-06 (×3): 2 via ORAL
  Filled 2015-03-04 (×3): qty 2

## 2015-03-04 MED ORDER — NALOXONE HCL 1 MG/ML IJ SOLN: 1.0000 ug/kg/h | INTRAMUSCULAR | Status: DC | PRN

## 2015-03-04 MED ORDER — CITRIC ACID-SODIUM CITRATE 334-500 MG/5ML PO SOLN
30.0000 mL | Freq: Once | ORAL | Status: AC
  Administered 2015-03-04: 30 mL via ORAL

## 2015-03-04 MED ORDER — OXYTOCIN 40 UNITS IN LACTATED RINGERS INFUSION - SIMPLE MED: 62.5000 mL/h | INTRAVENOUS | Status: AC

## 2015-03-04 MED ORDER — KETOROLAC TROMETHAMINE 30 MG/ML IJ SOLN: 30.0000 mg | Freq: Four times a day (QID) | INTRAMUSCULAR | Status: AC | PRN

## 2015-03-04 MED ORDER — DIPHENHYDRAMINE HCL 25 MG PO CAPS: 25.0000 mg | ORAL_CAPSULE | ORAL | Status: DC | PRN

## 2015-03-04 MED ORDER — ERYTHROMYCIN 5 MG/GM OP OINT
TOPICAL_OINTMENT | OPHTHALMIC | Status: AC
  Filled 2015-03-04: qty 1

## 2015-03-04 MED ORDER — EPHEDRINE 5 MG/ML INJ
INTRAVENOUS | Status: AC
  Filled 2015-03-04: qty 10

## 2015-03-04 MED ORDER — FAMOTIDINE IN NACL 20-0.9 MG/50ML-% IV SOLN
INTRAVENOUS | Status: AC
  Filled 2015-03-04: qty 50

## 2015-03-04 MED ORDER — DIBUCAINE 1 % RE OINT: 1.0000 "application " | TOPICAL_OINTMENT | RECTAL | Status: DC | PRN

## 2015-03-04 MED ORDER — PRENATAL MULTIVITAMIN CH
1.0000 | ORAL_TABLET | Freq: Every day | ORAL | Status: DC
  Administered 2015-03-05 – 2015-03-07 (×3): 1 via ORAL
  Filled 2015-03-04 (×3): qty 1

## 2015-03-04 MED ORDER — DIPHENHYDRAMINE HCL 50 MG/ML IJ SOLN: 12.5000 mg | INTRAMUSCULAR | Status: DC | PRN

## 2015-03-04 MED ORDER — IBUPROFEN 600 MG PO TABS
600.0000 mg | ORAL_TABLET | Freq: Four times a day (QID) | ORAL | Status: DC
  Administered 2015-03-05 – 2015-03-07 (×10): 600 mg via ORAL
  Filled 2015-03-04 (×11): qty 1

## 2015-03-04 MED ORDER — CITRIC ACID-SODIUM CITRATE 334-500 MG/5ML PO SOLN
ORAL | Status: AC
  Filled 2015-03-04: qty 15

## 2015-03-04 MED ORDER — ONDANSETRON HCL 4 MG/2ML IJ SOLN
INTRAMUSCULAR | Status: AC
  Filled 2015-03-04: qty 2

## 2015-03-04 MED ORDER — PHENYLEPHRINE 8 MG IN D5W 100 ML (0.08MG/ML) PREMIX OPTIME
INJECTION | INTRAVENOUS | Status: DC | PRN
  Administered 2015-03-04: 60 ug/min via INTRAVENOUS

## 2015-03-04 MED ORDER — ACETAMINOPHEN 325 MG PO TABS
650.0000 mg | ORAL_TABLET | ORAL | Status: DC | PRN
  Administered 2015-03-04: 650 mg via ORAL
  Filled 2015-03-04: qty 2

## 2015-03-04 MED ORDER — SCOPOLAMINE 1 MG/3DAYS TD PT72
MEDICATED_PATCH | TRANSDERMAL | Status: AC
  Filled 2015-03-04: qty 1

## 2015-03-04 MED ORDER — CEFAZOLIN SODIUM-DEXTROSE 2-3 GM-% IV SOLR
INTRAVENOUS | Status: DC | PRN
  Administered 2015-03-04: 2 g via INTRAVENOUS

## 2015-03-04 MED ORDER — KETOROLAC TROMETHAMINE 30 MG/ML IJ SOLN
30.0000 mg | Freq: Four times a day (QID) | INTRAMUSCULAR | Status: AC | PRN
  Administered 2015-03-04: 30 mg via INTRAVENOUS
  Filled 2015-03-04: qty 1

## 2015-03-04 SURGICAL SUPPLY — 32 items
CLAMP CORD UMBIL (MISCELLANEOUS) IMPLANT
CLOTH BEACON ORANGE TIMEOUT ST (SAFETY) ×3 IMPLANT
DRAPE SHEET LG 3/4 BI-LAMINATE (DRAPES) IMPLANT
DRSG OPSITE POSTOP 4X10 (GAUZE/BANDAGES/DRESSINGS) ×3 IMPLANT
DURAPREP 26ML APPLICATOR (WOUND CARE) ×3 IMPLANT
ELECT REM PT RETURN 9FT ADLT (ELECTROSURGICAL) ×3
ELECTRODE REM PT RTRN 9FT ADLT (ELECTROSURGICAL) ×1 IMPLANT
EXTRACTOR VACUUM M CUP 4 TUBE (SUCTIONS) IMPLANT
EXTRACTOR VACUUM M CUP 4' TUBE (SUCTIONS)
GLOVE BIO SURGEON STRL SZ8.5 (GLOVE) ×3 IMPLANT
GOWN STRL REUS W/TWL 2XL LVL3 (GOWN DISPOSABLE) ×3 IMPLANT
GOWN STRL REUS W/TWL LRG LVL3 (GOWN DISPOSABLE) ×3 IMPLANT
KIT ABG SYR 3ML LUER SLIP (SYRINGE) IMPLANT
LIQUID BAND (GAUZE/BANDAGES/DRESSINGS) ×3 IMPLANT
NEEDLE HYPO 25X5/8 SAFETYGLIDE (NEEDLE) IMPLANT
NS IRRIG 1000ML POUR BTL (IV SOLUTION) ×3 IMPLANT
PACK C SECTION WH (CUSTOM PROCEDURE TRAY) ×3 IMPLANT
PAD OB MATERNITY 4.3X12.25 (PERSONAL CARE ITEMS) ×3 IMPLANT
STAPLER VISISTAT 35W (STAPLE) ×3 IMPLANT
SUT CHROMIC 0 CT 802H (SUTURE) ×3 IMPLANT
SUT CHROMIC 0 MO4 CR (SUTURE) IMPLANT
SUT CHROMIC 1 CTX 36 (SUTURE) ×9 IMPLANT
SUT CHROMIC 2 0 SH (SUTURE) ×3 IMPLANT
SUT GUT PLAIN 0 CT-3 TAN 27 (SUTURE) IMPLANT
SUT MON AB 4-0 PS1 27 (SUTURE) ×3 IMPLANT
SUT PDS AB 0 CTX 36 PDP370T (SUTURE) IMPLANT
SUT VIC AB 0 CT1 18XCR BRD8 (SUTURE) IMPLANT
SUT VIC AB 0 CT1 8-18 (SUTURE)
SUT VIC AB 0 CTX 36 (SUTURE) ×4
SUT VIC AB 0 CTX36XBRD ANBCTRL (SUTURE) ×2 IMPLANT
TOWEL OR 17X24 6PK STRL BLUE (TOWEL DISPOSABLE) ×3 IMPLANT
TRAY FOLEY CATH SILVER 14FR (SET/KITS/TRAYS/PACK) ×3 IMPLANT

## 2015-03-04 NOTE — Anesthesia Procedure Notes (Signed)
Spinal Patient location during procedure: OR Staffing Anesthesiologist: Tomasina Keasling Performed by: anesthesiologist  Preanesthetic Checklist Completed: patient identified, site marked, surgical consent, pre-op evaluation, timeout performed, IV checked, risks and benefits discussed and monitors and equipment checked Spinal Block Patient position: sitting Prep: ChloraPrep Patient monitoring: continuous pulse ox, blood pressure and heart rate Approach: midline Location: L3-4 Injection technique: single-shot Needle Needle type: Sprotte  Needle gauge: 24 G Needle length: 9 cm Additional Notes Functioning IV was confirmed and monitors were applied. Sterile prep and drape, including hand hygiene, mask and sterile gloves were used. The patient was positioned and the spine was prepped. The skin was anesthetized with lidocaine.  Free flow of clear CSF was obtained prior to injecting local anesthetic into the CSF.  The spinal needle aspirated freely following injection.  The needle was carefully withdrawn.  The patient tolerated the procedure well. Consent was obtained prior to procedure with all questions answered and concerns addressed. Risks including but not limited to bleeding, infection, nerve damage, paralysis, failed block, inadequate analgesia, allergic reaction, high spinal, itching and headache were discussed and the patient wished to proceed.   Alizeh Madril, MD     

## 2015-03-04 NOTE — Anesthesia Preprocedure Evaluation (Signed)
Anesthesia Evaluation  Patient identified by MRN, date of birth, ID band Patient awake    Reviewed: Allergy & Precautions, NPO status , Patient's Chart, lab work & pertinent test results  History of Anesthesia Complications Negative for: history of anesthetic complications  Airway Mallampati: II  TM Distance: >3 FB Neck ROM: Full    Dental no notable dental hx. (+) Dental Advisory Given   Pulmonary neg pulmonary ROS,  breath sounds clear to auscultation  Pulmonary exam normal       Cardiovascular negative cardio ROS Normal cardiovascular examRhythm:Regular Rate:Normal     Neuro/Psych negative neurological ROS  negative psych ROS   GI/Hepatic negative GI ROS, Neg liver ROS,   Endo/Other  diabetesMorbid obesity  Renal/GU negative Renal ROS  negative genitourinary   Musculoskeletal negative musculoskeletal ROS (+)   Abdominal   Peds negative pediatric ROS (+)  Hematology negative hematology ROS (+)   Anesthesia Other Findings   Reproductive/Obstetrics (+) Pregnancy 9.5cm, contracting, large for gestational age baby, Dr. Gaynell Face calling an urgent C/S.                              Anesthesia Physical Anesthesia Plan  ASA: III and emergent  Anesthesia Plan: Spinal   Post-op Pain Management:    Induction:   Airway Management Planned:   Additional Equipment:   Intra-op Plan:   Post-operative Plan:   Informed Consent: I have reviewed the patients History and Physical, chart, labs and discussed the procedure including the risks, benefits and alternatives for the proposed anesthesia with the patient or authorized representative who has indicated his/her understanding and acceptance.   Dental advisory given  Plan Discussed with: CRNA  Anesthesia Plan Comments:         Anesthesia Quick Evaluation

## 2015-03-04 NOTE — Transfer of Care (Signed)
Immediate Anesthesia Transfer of Care Note  Patient: [name]  Procedure(s) Performed: Procedure(s): CESAREAN SECTION (N/A)  Patient Location: PACU  Anesthesia Type:Spinal  Level of Consciousness: awake, alert , oriented and patient cooperative  Airway & Oxygen Therapy: Patient Spontanous Breathing  Post-op Assessment: Report given to RN and Post -op Vital signs reviewed and stable  Post vital signs: Reviewed and stable  Last Vitals:  BP 110/52 HR 80 RR 16 TEMP 97.5 O2SAT 97  Complications: No apparent anesthesia complications

## 2015-03-04 NOTE — MAU Note (Signed)
cx's all day today, q5 mins., denies ROM,

## 2015-03-04 NOTE — Anesthesia Postprocedure Evaluation (Signed)
  Anesthesia Post-op Note  Patient: [name]  Procedure(s) Performed: Procedure(s) (LRB): CESAREAN SECTION (N/A)  Patient Location: PACU  Anesthesia Type: Spinal  Level of Consciousness: awake and alert   Airway and Oxygen Therapy: Patient Spontanous Breathing  Post-op Pain: mild  Post-op Assessment: Post-op Vital signs reviewed, Patient's Cardiovascular Status Stable, Respiratory Function Stable, Patent Airway and No signs of Nausea or vomiting  Last Vitals:  Filed Vitals:   03/04/15 1745  BP: 95/50  Pulse: 79  Temp:   Resp: 26    Post-op Vital Signs: stable   Complications: No apparent anesthesia complications

## 2015-03-04 NOTE — Lactation Note (Signed)
This note was copied from the chart of Alice Shizue Huster. Lactation Consultation Note Initial visit at 4 hours of age.  Mom reports baby was crying and she doesn't have much milk so she gave baby a bottle of formula as she has done with her other children.  Discussed supply and demand and encouraged mom to work on hand expression and put baby to breast first.  [facility] resources given and discussed.  Encouraged to feed with early cues on demand.  Early newborn behavior discussed.  Hand expression demonstrated with colostrum visible.  Mom to call for assist as needed.    Patient Name: [name] [id number] Date: 03/04/2015 Reason for consult: Initial assessment   Maternal Data Has patient been taught Hand Expression?: Yes Does the patient have breastfeeding experience prior to this delivery?: Yes  Feeding Feeding Type: Formula Nipple Type: Slow - flow Length of feed: 10 min  LATCH Score/Interventions Latch: Grasps breast easily, tongue down, lips flanged, rhythmical sucking.  Audible Swallowing: A few with stimulation Intervention(s): Skin to skin  Type of Nipple: Everted at rest and after stimulation  Comfort (Breast/Nipple): Soft / non-tender     Hold (Positioning): Full assist, staff holds infant at breast Intervention(s): Breastfeeding basics reviewed;Position options  LATCH Score: 7  Lactation Tools Discussed/Used     Consult Status Consult Status: Follow-up Date: 03/05/15 Follow-up type: In-patient    Jannifer Rodney 03/04/2015, 8:54 PM

## 2015-03-04 NOTE — Op Note (Signed)
Preop diagnosis gestational diabetes with fetal macrosomia by ultrasound Postop diagnosis same Surgeon Dr. Gaynell Face Anesthesia spinal Procedure patient placed on the operating table in the supine position after the abdomen prepped and draped bladder emptied with a Foley catheter a transverse suprapubic incision made carried   to the rectus fascia fascia cleaned and incised length of the incision recti muscles retracted laterally peritoneum incised  longitudially  transverse incision made on the visceroperitoneum above the bladder and the bladder mobilized inferiorly transverse low uterine incision made fluid clear patient delivered from the  Op  position of a female Apgar 9 and 9 weighing 8 lbs. 4 oz. placenta removed and sent to pathology uterine cavity clean with dry laps uterine incision closed in 2 layers with continuous   #1 chromic hemostasis satisfactory bladder flap reattached  With 20  chromic abdomen closed in layers peritoneum continuous with 2-0 chromic fascia continuous with 0 dexon  skin closed with staples blood  Loss  800 cc patient tolerated the procedure well

## 2015-03-04 NOTE — H&P (Signed)
This is Dr. Francoise Ceo dictating the history and physical on  [name]  she is a 34 year old gravida 6 para 4014 at 55 weeks and 3 days EDC 92 negative GBS patient's a diabetic gestational has been followed by MFM with nonstress tests and ultrasounds  The  estimated fetal weight on ultrasound on Friday was  Estimated   10 pounds and the patient now comes in in labor fully dilated -2 station membranes intact and method of delivery was discussed with the patient and she'll be delivered by C-section because of fetal macrosomia Past medical history she is a gestational diabetic on metformin 500 mg twice a day at bedtime and 500 mg in a.m. Past surgical history negative Social history negative System review denies headache nausea vomiting shortness of breath otherwise negative Physical obese female in labor HEENT negative Lungs clear to P&A Heart regular rhythm no murmurs no gallops Breasts negative Abdomen distended with a singleton pregnancy estimated fetal weight between 10 and 11 pounds Pelvic as described above Extremities negative

## 2015-03-05 ENCOUNTER — Encounter (HOSPITAL_COMMUNITY): Payer: Self-pay | Admitting: Obstetrics

## 2015-03-05 ENCOUNTER — Other Ambulatory Visit (HOSPITAL_COMMUNITY): Payer: Self-pay

## 2015-03-05 LAB — CBC
HEMATOCRIT: 28 % — AB (ref 36.0–46.0)
HEMOGLOBIN: 9.1 g/dL — AB (ref 12.0–15.0)
MCH: 28.4 pg (ref 26.0–34.0)
MCHC: 32.5 g/dL (ref 30.0–36.0)
MCV: 87.5 fL (ref 78.0–100.0)
Platelets: 137 10*3/uL — ABNORMAL LOW (ref 150–400)
RBC: 3.2 MIL/uL — ABNORMAL LOW (ref 3.87–5.11)
RDW: 13.9 % (ref 11.5–15.5)
WBC: 9.8 10*3/uL (ref 4.0–10.5)

## 2015-03-05 LAB — GLUCOSE, CAPILLARY: Glucose-Capillary: 179 mg/dL — ABNORMAL HIGH (ref 65–99)

## 2015-03-05 MED ORDER — METFORMIN HCL 500 MG PO TABS
500.0000 mg | ORAL_TABLET | Freq: Two times a day (BID) | ORAL | Status: DC
  Administered 2015-03-05 – 2015-03-07 (×4): 500 mg via ORAL
  Filled 2015-03-05 (×6): qty 1

## 2015-03-05 MED ORDER — METFORMIN HCL 500 MG PO TABS
500.0000 mg | ORAL_TABLET | Freq: Once | ORAL | Status: AC
  Administered 2015-03-05: 500 mg via ORAL
  Filled 2015-03-05: qty 1

## 2015-03-05 NOTE — Anesthesia Postprocedure Evaluation (Signed)
  Anesthesia Post-op Note  Patient: [name]  Procedure(s) Performed: Procedure(s): CESAREAN SECTION (N/A)  Patient Location: Women's Unit  Anesthesia Type:Spinal  Level of Consciousness: awake, alert  and oriented  Airway and Oxygen Therapy: Patient Spontanous Breathing  Post-op Pain: none  Post-op Assessment: Post-op Vital signs reviewed and Patient's Cardiovascular Status Stable LLE Motor Response: Purposeful movement LLE Sensation: Numbness RLE Motor Response: Purposeful movement RLE Sensation: Increased      Post-op Vital Signs: Reviewed and stable  Last Vitals:  Filed Vitals:   03/05/15 0938  BP: 102/46  Pulse: 90  Temp: 36.6 C  Resp: 20    Complications: No apparent anesthesia complications

## 2015-03-05 NOTE — Progress Notes (Signed)
Patient stated that she was diagnosed with Type II diabetes in 2011; stated she was taking Metformin 500 mg breakfast and at bedtime pre-pregnancy.  CBG checked, random, non fasting- was 179.  MD Dr. Gaynell Face called and informed.  Telephone orders received to check a fasting CBG tomorrow morning, and to restart her Metformin 500 mg twice a day.  Will continue to monitor.  Vivi Martens RN

## 2015-03-05 NOTE — Progress Notes (Signed)
UR chart review completed.  

## 2015-03-05 NOTE — Progress Notes (Signed)
Patient ID: [name], female   DOB: 04/29/1981, 34 y.o.   MRN: [id number] Postop day 1 Blood pressure 89/54 respiration 20 pulse 92 hemoglobin postop 9.3 Abdomen soft dressing dry Lochia moderate Legs negative doing well

## 2015-03-05 NOTE — Addendum Note (Signed)
Addendum  created 03/05/15 1126 by Janeece Agee, CRNA   Modules edited: Notes Section   Notes Section:  File: [id number]

## 2015-03-06 LAB — GLUCOSE, CAPILLARY
GLUCOSE-CAPILLARY: 144 mg/dL — AB (ref 65–99)
GLUCOSE-CAPILLARY: 140 mg/dL — AB (ref 65–99)
Glucose-Capillary: 181 mg/dL — ABNORMAL HIGH (ref 65–99)

## 2015-03-06 MED ORDER — BISACODYL 10 MG RE SUPP
10.0000 mg | Freq: Once | RECTAL | Status: AC
  Administered 2015-03-06: 10 mg via RECTAL
  Filled 2015-03-06: qty 1

## 2015-03-06 NOTE — Progress Notes (Signed)
Patient ID: [name], female   DOB: 09-03-1980, 34 y.o.   MRN: [id number] Postop day 2 Blood pressure low 113/63 respiration 18 pulse 88 afebrile Abdomen soft dressing dry Lochia moderate Legs negative doing well

## 2015-03-07 LAB — GLUCOSE, CAPILLARY: Glucose-Capillary: 86 mg/dL (ref 65–99)

## 2015-03-07 NOTE — Progress Notes (Signed)
Patient ID: [name], female   DOB: [birth year], 34 y.o.   MRN: [id number] Postop day 3 Blood pressure 117/69 respiration 18 pulse 91 afebrile glucose 141 No complaints abdomen soft incision clean legs negative patient will be discharged today on metformin 500 by mouth twice a day

## 2015-03-07 NOTE — Discharge Instructions (Signed)
Discharge instructions ° °· You can wash your hair °· Shower °· Eat what you want °· Drink what you want °· See me in 6 weeks °· Your ankles are going to swell more in the next 2 weeks than when pregnant °· No sex for 6 weeks ° ° °Zharia Conrow A, MD 03/07/2015 ° ° °

## 2015-03-07 NOTE — Lactation Note (Signed)
This note was copied from the chart of Alice Nickol Cieslak. Lactation Consultation Note: Mother is supplementing after breastfeeding. She states she doesn't have milk yet . She also states she has slight nipple soreness. Mother taught hand expression and she observed sprays of milk . Mother was given a hand pump with instructions that it is best to use her own milk when bottle feeding. Advised in supply and demand. Mother was given comfort gels. She was advised to call for assistance as needed. Reviewed treatment to prevent engorgement . Mother recepitve to all teaching.   Patient Name: [name] [id number] Date: 03/07/2015     Maternal Data    Feeding Feeding Type: Breast Fed Length of feed: 20 min  LATCH Score/Interventions                      Lactation Tools Discussed/Used     Consult Status      [name] 03/07/2015, 9:28 AM

## 2015-03-07 NOTE — Discharge Summary (Signed)
Obstetric Discharge Summary Reason for Admission: onset of labor Prenatal Procedures: NST Intrapartum Procedures: cesarean: low cervical, transverse Postpartum Procedures: none Complications-Operative and Postpartum: none HEMOGLOBIN  Date Value Ref Range Status  03/05/2015 9.1* 12.0 - 15.0 g/dL Final    Comment:    DELTA CHECK NOTED REPEATED TO VERIFY    HCT  Date Value Ref Range Status  03/05/2015 28.0* 36.0 - 46.0 % Final    Physical Exam:  General: alert Lochia: appropriate Uterine Fundus: firm Incision: healing well DVT Evaluation: No evidence of DVT seen on physical exam.  Discharge Diagnoses: Term Pregnancy-delivered  Discharge Information: Date: 03/07/2015 Activity: pelvic rest Diet: routine Medications: Percocet Condition: stable Instructions: refer to practice specific booklet Discharge to: home Follow-up Information    Follow up with Kathreen Cosier, MD.   Specialty:  Obstetrics and Gynecology   Contact information:   [address] [place] [address] [phone]       Newborn Data: Live born female  Birth Weight: 8 lb 4.3 oz (3751 g) APGAR: 9, 9  Home with mother.  Kenadee Gates A 03/07/2015, 6:18 AM

## 2015-03-08 LAB — RPR: RPR: NONREACTIVE

## 2015-03-12 ENCOUNTER — Encounter: Payer: Self-pay | Admitting: Pharmacist

## 2015-06-26 ENCOUNTER — Encounter: Payer: Self-pay | Admitting: Internal Medicine

## 2015-06-26 ENCOUNTER — Ambulatory Visit (INDEPENDENT_AMBULATORY_CARE_PROVIDER_SITE_OTHER): Payer: Self-pay | Admitting: Internal Medicine

## 2015-06-26 VITALS — BP 124/86 | HR 80 | Ht 64.0 in | Wt 209.0 lb

## 2015-06-26 DIAGNOSIS — F341 Dysthymic disorder: Secondary | ICD-10-CM

## 2015-06-26 DIAGNOSIS — E119 Type 2 diabetes mellitus without complications: Secondary | ICD-10-CM

## 2015-06-26 DIAGNOSIS — K029 Dental caries, unspecified: Secondary | ICD-10-CM

## 2015-06-26 LAB — GLUCOSE, POCT (MANUAL RESULT ENTRY): POC GLUCOSE: 214 mg/dL — AB (ref 70–99)

## 2015-06-26 MED ORDER — METFORMIN HCL 500 MG PO TABS: 500.0000 mg | ORAL_TABLET | Freq: Two times a day (BID) | ORAL | Status: DC

## 2015-06-26 NOTE — Progress Notes (Signed)
   Subjective:    Patient ID: [name], female    DOB: 04/07/1981, 34 y.o.   MRN: [id number]  HPI  1.  DM:  Diagnosed in 2011 after a pregnancy.  [facility]  Initiated on Metformin.  Pt. Cannot recall levels of A1C in the past.  Does state her sugars fasting are generally below 100, but today was 108.  Out of Metformin after her dose this morning.  No problems with Metformin.   No eye check in last year.  Last eye check was 2008 before diagnosis. Wears glasses.  Orange card, however, is expired.  Plans to renew tomorrow.   No history of diabetic eye disease, kidney disease.  No numbness or tingling in hands or feet. No history of hyperlipidemia. Is anemic and being followed by her OB/gyn, Dr. Gaynell FaceMarshall for this--continues on iron. Has not had flu vaccine this year. Has not ever had pneumovax Did get Tdap in August.  2.  Dental concerns:  Would like to see about getting replacement of lower incisors that fell out long ago.  Also, if something can be done with the large gap between her upper middle incisors.        Review of Systems     Objective:   Physical Exam  Constitutional:  Obese, NAD  HENT:  Missing middle incisors Upper middle incisors flared out with wide space between  Eyes: EOM are normal. Pupils are equal, round, and reactive to light.  Discs sharp bilaterally.  No hemorrhages or exudates  Neck: Normal range of motion. Neck supple. No thyromegaly present.  Cardiovascular: Normal rate, regular rhythm, normal heart sounds and intact distal pulses.  Exam reveals no friction rub.   No murmur heard. Pulmonary/Chest: Effort normal and breath sounds normal.  Abdominal: Soft. Bowel sounds are normal. She exhibits no mass. There is no tenderness. There is no guarding.  No HSM  Lymphadenopathy:    She has no cervical adenopathy.  Neurological:  10 gram monofilament exam of feet normal bilaterally.    Skin:  Examination of feet normal            Assessment & Plan:  1.  DM II:  Refill Metformin.  Check A1C.  Work on diet and physical activity for weight loss.  Check recent labs.    2.  Concern for dysthymia despite score on PHQ9.  Concerned she is feeling overwhelmed.  Warm hand off to [employer] Knight, LCSW.  Pt. To contemplate follow up with Ms. Terrilee CroakKnight.  3.  MIssing teeth, likely with dental decay and also abnormal bite

## 2015-06-26 NOTE — Patient Instructions (Addendum)
Can google "advance directives, [place]"  And bring up form from [place]. Print and fill out Or can go to "5 wishes"  Which is also in Spanish and fill out--this costs $5--perhaps easier to use. Designate a CytogeneticistMedical Power of Attorney to speak for you if you are unable to speak for yourself when ill or injured  Drink a glass of water before every meal Drink 6-8 glasses of water daily Eat three meals daily Eat a protein and healthy fat with every meal (eggs,fish, chicken, [place] and limit red meats) Eat 5 servings of vegetables daily, mix the colors Eat 2 servings of fruit daily with skin, if skin is edible Use smaller plates Put food/utensils down as you chew and swallow each bite Eat at a table with friends/family at least once daily, no TV Do not eat in front of the TV  Recommend getting Flu and P[facility]

## 2015-06-27 LAB — HGB A1C W/O EAG: Hgb A1c MFr Bld: 7.9 % — ABNORMAL HIGH (ref 4.8–5.6)

## 2015-07-10 ENCOUNTER — Emergency Department (HOSPITAL_COMMUNITY): Payer: No Typology Code available for payment source

## 2015-07-10 ENCOUNTER — Emergency Department (HOSPITAL_COMMUNITY)
Admission: EM | Admit: 2015-07-10 | Discharge: 2015-07-10 | Disposition: A | Discharge status: Home / Self Care | Payer: No Typology Code available for payment source | Attending: Emergency Medicine | Admitting: Emergency Medicine

## 2015-07-10 ENCOUNTER — Encounter (HOSPITAL_COMMUNITY): Payer: Self-pay | Admitting: *Deleted

## 2015-07-10 DIAGNOSIS — Y998 Other external cause status: Secondary | ICD-10-CM | POA: Insufficient documentation

## 2015-07-10 DIAGNOSIS — Y92481 Parking lot as the place of occurrence of the external cause: Secondary | ICD-10-CM | POA: Diagnosis not present

## 2015-07-10 DIAGNOSIS — Z3202 Encounter for pregnancy test, result negative: Secondary | ICD-10-CM | POA: Insufficient documentation

## 2015-07-10 DIAGNOSIS — E119 Type 2 diabetes mellitus without complications: Secondary | ICD-10-CM | POA: Insufficient documentation

## 2015-07-10 DIAGNOSIS — S299XXA Unspecified injury of thorax, initial encounter: Secondary | ICD-10-CM | POA: Diagnosis present

## 2015-07-10 DIAGNOSIS — Y9389 Activity, other specified: Secondary | ICD-10-CM | POA: Insufficient documentation

## 2015-07-10 DIAGNOSIS — Z79899 Other long term (current) drug therapy: Secondary | ICD-10-CM | POA: Insufficient documentation

## 2015-07-10 DIAGNOSIS — R0789 Other chest pain: Secondary | ICD-10-CM

## 2015-07-10 LAB — I-STAT CHEM 8, ED
BUN: 8 mg/dL (ref 6–20)
Calcium, Ion: 1.26 mmol/L — ABNORMAL HIGH (ref 1.12–1.23)
Chloride: 107 mmol/L (ref 101–111)
Creatinine, Ser: 0.4 mg/dL — ABNORMAL LOW (ref 0.44–1.00)
Glucose, Bld: 98 mg/dL (ref 65–99)
HCT: 46 % (ref 36.0–46.0)
Hemoglobin: 15.6 g/dL — ABNORMAL HIGH (ref 12.0–15.0)
Potassium: 3.9 mmol/L (ref 3.5–5.1)
Sodium: 143 mmol/L (ref 135–145)
TCO2: 23 mmol/L (ref 0–100)

## 2015-07-10 LAB — POC URINE PREG, ED: Preg Test, Ur: NEGATIVE

## 2015-07-10 MED ORDER — IBUPROFEN 600 MG PO TABS: 600.0000 mg | ORAL_TABLET | Freq: Four times a day (QID) | ORAL | Status: DC | PRN

## 2015-07-10 MED ORDER — IOHEXOL 300 MG/ML  SOLN
75.0000 mL | Freq: Once | INTRAMUSCULAR | Status: AC | PRN
  Administered 2015-07-10: 75 mL via INTRAVENOUS

## 2015-07-10 MED ORDER — ACETAMINOPHEN 325 MG PO TABS
325.0000 mg | ORAL_TABLET | Freq: Once | ORAL | Status: AC
  Administered 2015-07-10: 325 mg via ORAL
  Filled 2015-07-10: qty 1
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
## 2015-07-10 NOTE — Discharge Instructions (Signed)
Chest Wall Pain °Chest wall pain is pain in or around the bones and muscles of your chest. Sometimes, an injury causes this pain. Sometimes, the cause may not be known. This pain may take several weeks or longer to get better. °HOME CARE °Pay attention to any changes in your symptoms. Take these actions to help with your pain: °· Rest as told by your doctor. °· Avoid activities that cause pain. Try not to use your chest, belly (abdominal), or side muscles to lift heavy things. °· If directed, apply ice to the painful area: °¨ Put ice in a plastic bag. °¨ Place a towel between your skin and the bag. °¨ Leave the ice on for 20 minutes, 2-3 times per day. °· Take over-the-counter and prescription medicines only as told by your doctor. °· Do not use tobacco products, including cigarettes, chewing tobacco, and e-cigarettes. If you need help quitting, ask your doctor. °· Keep all follow-up visits as told by your doctor. This is important. °GET HELP IF: °· You have a fever. °· Your chest pain gets worse. °· You have new symptoms. °GET HELP RIGHT AWAY IF: °· You feel sick to your stomach (nauseous) or you throw up (vomit). °· You feel sweaty or light-headed. °· You have a cough with phlegm (sputum) or you cough up blood. °· You are short of breath. °  °This information is not intended to replace advice given to you by your health care provider. Make sure you discuss any questions you have with your health care provider. °  °Document Released: 12/16/2007 Document Revised: 03/20/2015 Document Reviewed: 09/24/2014 °Elsevier Interactive Patient Education ©2016 Elsevier Inc[employer] ° °Please read attached information. If you experience any new or worsening signs or symptoms please return to the emergency room for evaluation. Please follow-up with your primary care provider or specialist as discussed. Please use medication prescribed only as directed and discontinue taking if you have any concerning signs or symptoms.  °

## 2015-07-10 NOTE — ED Notes (Signed)
Pt ambulated to restroom. 
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
## 2015-07-10 NOTE — ED Notes (Signed)
Patient verbalized understanding of discharge instructions and denies any further needs or questions at this time. VS stable. Patient ambulatory with steady gait.  

## 2015-09-23 ENCOUNTER — Ambulatory Visit: Payer: Self-pay | Admitting: Internal Medicine

## 2015-09-27 ENCOUNTER — Ambulatory Visit (INDEPENDENT_AMBULATORY_CARE_PROVIDER_SITE_OTHER): Payer: Self-pay | Admitting: Internal Medicine

## 2015-09-27 ENCOUNTER — Encounter: Payer: Self-pay | Admitting: Internal Medicine

## 2015-09-27 VITALS — BP 116/76 | HR 90 | Temp 98.7°F | Resp 20 | Ht 64.0 in | Wt 210.0 lb

## 2015-09-27 DIAGNOSIS — B349 Viral infection, unspecified: Secondary | ICD-10-CM

## 2015-09-27 DIAGNOSIS — J209 Acute bronchitis, unspecified: Secondary | ICD-10-CM

## 2015-09-27 DIAGNOSIS — E119 Type 2 diabetes mellitus without complications: Secondary | ICD-10-CM

## 2015-09-27 LAB — GLUCOSE, POCT (MANUAL RESULT ENTRY): POC GLUCOSE: 320 mg/dL — AB (ref 70–99)

## 2015-09-27 MED ORDER — AZITHROMYCIN 250 MG PO TABS: ORAL_TABLET | ORAL | Status: DC

## 2015-09-27 NOTE — Progress Notes (Signed)
   Subjective:    Patient ID: [name], female    DOB: 11/25/1980, 35 y.o.   MRN: [id number]  HPI   Cold, cough, throat pain and right ear pain with headache.  Has had for 5 days.  Has had a fever.  Highest was 100.5 F.  Hurts to swallow, so has not been drinking as well as she should.   Urinating ok and urine is not dark and is clear.  No nausea, vomiting or diarrhea. Her 616 month old has been ill as well with URI symptoms.  She is nursing her baby.  She is also taking water from a bottle.   She has not been checking sugars, but has been taking Metformin twice daily.    Meds: 1.  Metformin 500 mg twice daily 2.  Ferrous sulfate 325 mg daily 3.  Nexplanon implant  Allergies  Allergen Reactions  . Naproxen Nausea And Vomiting    Review of Systems     Objective:   Physical Exam  Mouth breathing with terrible breath HEENT:  PERRL, EOMI, TMs pearly gray, throat injection, scant white exudate. Nasal mucosa swollen with erythema Neck:  Supple, no adenopathy Chest:  Scattered wheeze and crackles CV:  RRR without murmur or rub, radial pulses normal and equal Abd:  S, NT No HSM or mass, +BS   Albuterol neb 2.5 mg with resolution of wheezing and crackles.      Assessment & Plan:  1.  Pharyngitis:  Rapid strep negative.  Part of #2  2.  Acute URI with bronchitis and bronchospasm: Zpak to cover for atypicals.  Pt. Given loaner neb and enough Albuterol ampules to use 4 times daily over the weekend.  Follow up beginning of next week   3.  DM:  To watch her sugars and eat healthy diet partcularly while ill.

## 2015-09-27 NOTE — Patient Instructions (Addendum)
Ibuprofen 200 mg 2-4 tabs with food every 6 hours for throat pain and fever Drink small amounts of cold water frequently--all day long  Antibiotic at [facility]  Use the nebulizer with the medicine--1 full tube in the nebulizer every 4 to 6 hours (no more than 4 times a day When you get home after picking up antibiotic, give yourself another treatment today. Call the office over the weekend if any problems

## 2015-09-30 ENCOUNTER — Ambulatory Visit (INDEPENDENT_AMBULATORY_CARE_PROVIDER_SITE_OTHER): Payer: Self-pay | Admitting: Internal Medicine

## 2015-09-30 ENCOUNTER — Encounter: Payer: Self-pay | Admitting: Internal Medicine

## 2015-09-30 VITALS — BP 122/84 | HR 80 | Temp 98.0°F | Resp 18 | Ht 64.0 in | Wt 210.0 lb

## 2015-09-30 DIAGNOSIS — J209 Acute bronchitis, unspecified: Secondary | ICD-10-CM

## 2015-09-30 DIAGNOSIS — E119 Type 2 diabetes mellitus without complications: Secondary | ICD-10-CM

## 2015-09-30 LAB — GLUCOSE, POCT (MANUAL RESULT ENTRY): POC Glucose: 242 mg/dl — AB (ref 70–99)

## 2015-09-30 NOTE — Progress Notes (Signed)
   Subjective:    Patient ID: [name], female    DOB: 03/06/1981, 35 y.o.   MRN: [id number]  HPI   Feeling much better in past 2 days.  Used all the albuterol treatments.  Would like a couple of albuterol neb treatments just in case.   Not coughing.  Mild dyspnea at times.  Did fill the Azithromycin and is on day 4 of pack.   Sore throat has resolved.   Glucose is doing better.     Current outpatient prescriptions:  .  azithromycin (ZITHROMAX) 250 MG tablet, 2 tabs by mouth today, then 1 tab by mouth daily for 4 more days, Disp: 6 tablet, Rfl: 0 .  etonogestrel (NEXPLANON) 68 MG IMPL implant, 1 each by Subdermal route once., Disp: , Rfl:  .  ferrous sulfate 325 (65 FE) MG tablet, Take 325 mg by mouth daily with breakfast., Disp: , Rfl:  .  metFORMIN (GLUCOPHAGE) 500 MG tablet, Take 1 tablet (500 mg total) by mouth 2 (two) times daily with a meal., Disp: 60 tablet, Rfl: 11 .  ibuprofen (ADVIL,MOTRIN) 600 MG tablet, Take 1 tablet (600 mg total) by mouth every 6 (six) hours as needed. (Patient not taking: Reported on 09/27/2015), Disp: 30 tablet, Rfl: 0  Albuterol for nebulization 2.5 mg ampule every 4 hours as needed for dyspnea.  Allergies  Allergen Reactions  . Naproxen Nausea And Vomiting    Review of Systems     Objective:   Physical Exam  HEENT:  Throat with without injection or exudate.  No mouth breathing today.  TMs pearly gray. Neck:  Supple, no adenopathy Chest:  CTA without crackle or wheeze. CV: RRR without murmur or rub.      Assessment & Plan:  Acute bronchitis with bronchospasm:  Doing much better.  To use Albuterol neb only as needed.  Finish Azithromycin for possible atypical pneumonitis, though likely this was viral.  DM;  To continue to monitor DM closely with illness

## 2015-11-04 ENCOUNTER — Ambulatory Visit (INDEPENDENT_AMBULATORY_CARE_PROVIDER_SITE_OTHER): Payer: Self-pay | Admitting: Internal Medicine

## 2015-11-04 ENCOUNTER — Encounter: Payer: Self-pay | Admitting: Internal Medicine

## 2015-11-04 VITALS — BP 142/90 | HR 80 | Resp 18 | Ht 64.0 in | Wt 214.0 lb

## 2015-11-04 DIAGNOSIS — J209 Acute bronchitis, unspecified: Secondary | ICD-10-CM

## 2015-11-04 DIAGNOSIS — E119 Type 2 diabetes mellitus without complications: Secondary | ICD-10-CM

## 2015-11-04 DIAGNOSIS — R030 Elevated blood-pressure reading, without diagnosis of hypertension: Secondary | ICD-10-CM

## 2015-11-04 DIAGNOSIS — IMO0001 Reserved for inherently not codable concepts without codable children: Secondary | ICD-10-CM

## 2015-11-04 LAB — GLUCOSE, POCT (MANUAL RESULT ENTRY): POC Glucose: 136 mg/dl — AB (ref 70–99)

## 2015-11-04 NOTE — Progress Notes (Signed)
   Subjective:    Patient ID: [name], female    DOB: 03/01/1981, 35 y.o.   MRN: [id number]  HPI   1.  DM Type 2:  Does not have current orange card. Has not had an eye check for many year. Has not obtained Pneumovax. Checks feet and does foot care nightly. In 1 week, checks sugar 3-4 times.  Getting sugars up to 200 in past 2 weeks.  Prior to that, generally in mid 100s.   Sometimes with polyuria, no polydipsia. Feels she knows what she should be doing with diet and physical activity.   Walks 2-3 times weekly.  Doesn't want to take her baby girl out in cold some days. Not fasting today.   Has not had blood work done in some time. Would like a medication to help her lose weight.  2.  Elevated BP:  Pt. States could not sleep last night.  Daughter married 2 days ago in [place] and she had a separate celebration here.  3.  Acute bronchitis with bronchospasm: resolved.  Brought nebulizer back   Current outpatient prescriptions:  .  etonogestrel (NEXPLANON) 68 MG IMPL implant, 1 each by Subdermal route once., Disp: , Rfl:  .  metFORMIN (GLUCOPHAGE) 500 MG tablet, Take 1 tablet (500 mg total) by mouth 2 (two) times daily with a meal., Disp: 60 tablet, Rfl: 11 .  ferrous sulfate 325 (65 FE) MG tablet, Take 325 mg by mouth daily with breakfast. Reported on 11/04/2015, Disp: , Rfl:    Allergies  Allergen Reactions  . Naproxen Nausea And Vomiting    Review of Systems     Objective:   Physical Exam Obese, nursing 398 month old during exam Lungs:  CTA CV:  RRR with normal S1 and S2, grade 1-2/6 SEM LSB--lightly heard in right second interspace and carotids,  Normal and equal carotid pulses.  Radial pulses normal and equal        Assessment & Plan:  1.  DM Type 2:  Return in next 2 weeks for fasting labs:  A1C, urine microalbumin, CMP, CBC, FLP  Encouraged regular physical activity and to work on how she eats for 1 lb weight loss each week as goal.  2.  Elevated BP:  Recheck  when returns for labs  3.  Bronchitis with bronchospasm:  Resolved.

## 2015-11-25 ENCOUNTER — Other Ambulatory Visit (INDEPENDENT_AMBULATORY_CARE_PROVIDER_SITE_OTHER): Payer: Self-pay

## 2015-11-25 DIAGNOSIS — E785 Hyperlipidemia, unspecified: Secondary | ICD-10-CM

## 2015-11-25 DIAGNOSIS — Z79899 Other long term (current) drug therapy: Secondary | ICD-10-CM

## 2015-11-25 DIAGNOSIS — E119 Type 2 diabetes mellitus without complications: Secondary | ICD-10-CM

## 2015-11-26 LAB — COMPREHENSIVE METABOLIC PANEL
ALBUMIN: 4.5 g/dL (ref 3.5–5.5)
ALK PHOS: 54 IU/L (ref 39–117)
ALT: 8 IU/L (ref 0–32)
AST: 11 IU/L (ref 0–40)
Albumin/Globulin Ratio: 1.6 (ref 1.2–2.2)
BUN / CREAT RATIO: 23 (ref 9–23)
BUN: 10 mg/dL (ref 6–20)
Bilirubin Total: 0.5 mg/dL (ref 0.0–1.2)
CALCIUM: 9.7 mg/dL (ref 8.7–10.2)
CO2: 21 mmol/L (ref 18–29)
CREATININE: 0.43 mg/dL — AB (ref 0.57–1.00)
Chloride: 103 mmol/L (ref 96–106)
GFR calc Af Amer: 150 mL/min/{1.73_m2} (ref >59)
GFR, EST NON AFRICAN AMERICAN: 130 mL/min/{1.73_m2} (ref >59)
GLOBULIN, TOTAL: 2.9 g/dL (ref 1.5–4.5)
GLUCOSE: 144 mg/dL — AB (ref 65–99)
Potassium: 4 mmol/L (ref 3.5–5.2)
SODIUM: 141 mmol/L (ref 134–144)
Total Protein: 7.4 g/dL (ref 6.0–8.5)

## 2015-11-26 LAB — LIPID PANEL W/O CHOL/HDL RATIO
CHOLESTEROL TOTAL: 201 mg/dL — AB (ref 100–199)
HDL: 52 mg/dL (ref >39)
LDL CALC: 129 mg/dL — AB (ref 0–99)
TRIGLYCERIDES: 102 mg/dL (ref 0–149)
VLDL CHOLESTEROL CAL: 20 mg/dL (ref 5–40)

## 2015-11-26 LAB — MICROALBUMIN, URINE

## 2015-11-26 LAB — CBC WITH DIFFERENTIAL/PLATELET
BASOS ABS: 0 10*3/uL (ref 0.0–0.2)
Basos: 0 %
EOS (ABSOLUTE): 0.1 10*3/uL (ref 0.0–0.4)
Eos: 2 %
HEMATOCRIT: 39.4 % (ref 34.0–46.6)
HEMOGLOBIN: 12.8 g/dL (ref 11.1–15.9)
IMMATURE GRANULOCYTES: 0 %
Immature Grans (Abs): 0 10*3/uL (ref 0.0–0.1)
LYMPHS ABS: 2.1 10*3/uL (ref 0.7–3.1)
Lymphs: 44 %
MCH: 27.4 pg (ref 26.6–33.0)
MCHC: 32.5 g/dL (ref 31.5–35.7)
MCV: 84 fL (ref 79–97)
MONOCYTES: 7 %
Monocytes Absolute: 0.3 10*3/uL (ref 0.1–0.9)
NEUTROS ABS: 2.3 10*3/uL (ref 1.4–7.0)
Neutrophils: 47 %
Platelets: 268 10*3/uL (ref 150–379)
RBC: 4.68 x10E6/uL (ref 3.77–5.28)
RDW: 13.9 % (ref 12.3–15.4)
WBC: 4.8 10*3/uL (ref 3.4–10.8)

## 2015-11-26 LAB — HGB A1C W/O EAG: Hgb A1c MFr Bld: 8.4 % — ABNORMAL HIGH (ref 4.8–5.6)

## 2016-02-03 ENCOUNTER — Encounter: Payer: Self-pay | Admitting: Internal Medicine

## 2016-02-03 ENCOUNTER — Ambulatory Visit (INDEPENDENT_AMBULATORY_CARE_PROVIDER_SITE_OTHER): Payer: Self-pay | Admitting: Internal Medicine

## 2016-02-03 VITALS — BP 128/80 | HR 80 | Resp 16 | Ht 63.5 in | Wt 206.0 lb

## 2016-02-03 DIAGNOSIS — IMO0002 Reserved for concepts with insufficient information to code with codable children: Secondary | ICD-10-CM

## 2016-02-03 DIAGNOSIS — E785 Hyperlipidemia, unspecified: Secondary | ICD-10-CM

## 2016-02-03 DIAGNOSIS — E119 Type 2 diabetes mellitus without complications: Secondary | ICD-10-CM

## 2016-02-03 DIAGNOSIS — G9050 Complex regional pain syndrome I, unspecified: Secondary | ICD-10-CM

## 2016-02-03 LAB — GLUCOSE, POCT (MANUAL RESULT ENTRY): POC GLUCOSE: 208 mg/dL — AB (ref 70–99)

## 2016-02-03 MED ORDER — GABAPENTIN 300 MG PO CAPS: 300.0000 mg | ORAL_CAPSULE | Freq: Two times a day (BID) | ORAL | 0 refills | Status: DC

## 2016-02-03 MED ORDER — AGAMATRIX PRESTO PRO METER DEVI: 0 refills | Status: DC

## 2016-02-03 MED ORDER — GLUCOSE BLOOD VI STRP: ORAL_STRIP | 12 refills | Status: DC

## 2016-02-03 MED ORDER — GABAPENTIN 300 MG PO CAPS: 300.0000 mg | ORAL_CAPSULE | Freq: Two times a day (BID) | ORAL | 11 refills | Status: DC

## 2016-02-03 NOTE — Progress Notes (Signed)
Subjective:    Patient ID: [name], female    DOB: [birth year], 35 y.o.   MRN: [id number]  HPI   1.  DM 2:  Only checking sugars 2-3 times weekly.  Generally in morning, fasting.  Sugars are generally mid 100s to low 200s.   Did have illness in March with bronchitis and wheezing requiring treatment and her sugars were high then.  She did not receive corticosteroids for that illness. Has lost 7 lbs since last visit on May 15th.  Is walking a lot more.  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   Her 62 year old son often watches the younger children for her.   No elevated urine microalbumin in May. BP is fine off medication. No numbness, tingling, or burning of feet. Has not had eye exam--referral sent in December. Has had influenza this past fall. No pneumovax yet. Checking feet nightly; no concerns  2.  Mild hyperlipidemia: Results for HARLA, MENSCH (MRN [id number]) as of 02/03/2016 11:53  Ref. Range 11/25/2015 09:11  Cholesterol, Total Latest Ref Range: 100 - 199 mg/dL 086 (H)  Triglycerides Latest Ref Range: 0 - 149 mg/dL 578  HDL Cholesterol Latest Ref Range: >39 mg/dL 52  LDL (calc) Latest Ref Range: 0 - 99 mg/dL 469 (H)  VLDL Cholesterol Cal Latest Ref Range: 5 - 40 mg/dL 20  Discussed goal to get LDL below 70 with lifestyle changes.     3.  Left arm pain:  Larey Seat on her arm in 2.2014:  Tripped over foot of bed and landed on lateral aspect of left shoulder and arm.  Was diagnosed with "nerve" pain by [facility] and sent to pain clinic at [facility].  She was started on Gabapentin 300 mg twice daily which really helped.  Has been out for 2-3 months and her pain is much worse. Looking at her records from [facility], diagnosed with Posterior Intersosseous Nerve Syndrome, left in 02/2014   Current Outpatient Prescriptions:  .  etonogestrel (NEXPLANON) 68 MG IMPL implant, 1 each by Subdermal route once., Disp: , Rfl:  .  gabapentin (NEURONTIN)  300 MG capsule, Take 1 capsule (300 mg total) by mouth 2 (two) times daily., Disp: 60 capsule, Rfl: 11 .  metFORMIN (GLUCOPHAGE) 500 MG tablet, Take 1 tablet (500 mg total) by mouth 2 (two) times daily with a meal., Disp: 60 tablet, Rfl: 11 .  ferrous sulfate 325 (65 FE) MG tablet, Take 325 mg by mouth daily with breakfast. Reported on 11/04/2015, Disp: , Rfl:    Allergies  Allergen Reactions  . Naproxen Nausea And Vomiting   Immunization History  Administered Date(s) Administered  . Influenza-Unspecified 07/03/2015  . Tdap 03/06/2015         Review of Systems     Objective:   Physical Exam NAD Lungs:  CTA CV:  RRR without murmur or rub, radial pulses normal and equal Abd:  S, NT, No HSM or amss, + BS LE:  No edema        Assessment & Plan:  1.  DM 2:  Still has not obtained orange card, which is causing difficulties with referrals as well as cost of medications. Strongly urged her to make an appt to get that done. Hold on Pneumovax for now as she has a rather large bill. Needs to check sugars more frequently, but unable to obtain strips as too expensive outside of [facility] pharmacy--again, needs orange carde  2.  Mild Hyperlipidemia:  To work on diet and physical activity.  Plannning perhaps to have one more baby once removes Nexplanon.  3.  Left shoulder pain:  Restart Gabapentin at previous dosing

## 2016-04-02 ENCOUNTER — Other Ambulatory Visit (INDEPENDENT_AMBULATORY_CARE_PROVIDER_SITE_OTHER): Payer: Self-pay

## 2016-04-02 DIAGNOSIS — E785 Hyperlipidemia, unspecified: Secondary | ICD-10-CM

## 2016-04-02 DIAGNOSIS — E119 Type 2 diabetes mellitus without complications: Secondary | ICD-10-CM

## 2016-04-03 LAB — LIPID PANEL W/O CHOL/HDL RATIO
CHOLESTEROL TOTAL: 176 mg/dL (ref 100–199)
HDL: 48 mg/dL (ref >39)
LDL CALC: 112 mg/dL — AB (ref 0–99)
TRIGLYCERIDES: 81 mg/dL (ref 0–149)
VLDL CHOLESTEROL CAL: 16 mg/dL (ref 5–40)

## 2016-04-03 LAB — HGB A1C W/O EAG: Hgb A1c MFr Bld: 7 % — ABNORMAL HIGH (ref 4.8–5.6)

## 2016-04-06 ENCOUNTER — Encounter: Payer: Self-pay | Admitting: Internal Medicine

## 2016-04-06 ENCOUNTER — Ambulatory Visit (INDEPENDENT_AMBULATORY_CARE_PROVIDER_SITE_OTHER): Payer: Self-pay | Admitting: Internal Medicine

## 2016-04-06 VITALS — BP 118/82 | HR 82 | Resp 16 | Ht 63.25 in | Wt 208.0 lb

## 2016-04-06 DIAGNOSIS — IMO0002 Reserved for concepts with insufficient information to code with codable children: Secondary | ICD-10-CM

## 2016-04-06 DIAGNOSIS — E119 Type 2 diabetes mellitus without complications: Secondary | ICD-10-CM

## 2016-04-06 DIAGNOSIS — E785 Hyperlipidemia, unspecified: Secondary | ICD-10-CM

## 2016-04-06 DIAGNOSIS — Z23 Encounter for immunization: Secondary | ICD-10-CM

## 2016-04-06 DIAGNOSIS — G9050 Complex regional pain syndrome I, unspecified: Secondary | ICD-10-CM

## 2016-04-06 DIAGNOSIS — E0590 Thyrotoxicosis, unspecified without thyrotoxic crisis or storm: Secondary | ICD-10-CM | POA: Insufficient documentation

## 2016-04-06 DIAGNOSIS — E010 Iodine-deficiency related diffuse (endemic) goiter: Secondary | ICD-10-CM

## 2016-04-06 DIAGNOSIS — R011 Cardiac murmur, unspecified: Secondary | ICD-10-CM

## 2016-04-06 DIAGNOSIS — E049 Nontoxic goiter, unspecified: Secondary | ICD-10-CM

## 2016-04-06 LAB — GLUCOSE, POCT (MANUAL RESULT ENTRY): POC GLUCOSE: 241 mg/dL — AB (ref 70–99)

## 2016-04-06 NOTE — Progress Notes (Signed)
   Subjective:    Patient ID: [name], female    DOB: 04/12/1981, 35 y.o.   MRN: [id number]  HPI   1.  DM Type 2:  A1C dropped from 8.4% 4 months ago to 7.0% last week.     She continues to work on her diet and physical activity. Has not had eyes checked this year--Still waiting from December referral Not checking feet nightly Has not had pneumovax 23 V or influenza this year.   2.  Hyperlipidemia:  Cholesterol panel much better and generally at goal save for LDL, though the latter improved as well with lifestyle changes.   Planning to have at least another child  Lipid Panel     Component Value Date/Time   CHOL 176 04/02/2016 0854   TRIG 81 04/02/2016 0854   HDL 48 04/02/2016 0854   LDLCALC 112 (H) 04/02/2016 0854   3.  Right shoulder and arm discomfort:  Has not picked up Gabapentin yet.   Review of Systems     Objective:   Physical Exam   Lungs:  CTA CV: RRR with normal S1 and S2, Grade II-III/VI SEM upper left sternal border, radiating to right second interspace and mildy to  Carotids.  Carotid, radial and DP pulses normal and equal Neck:  Supple, no adenopathy,Thyroid generous but without mass       Assessment & Plan:  1.  New finding of systolic ejection sounding murmur:  Check echo. EKG today as well.  2.  DM Type II:  Control improved Encouraged foot check and care nightly Pneumovax 23 valent today. Flu shot this weekend with [facility]  3.  Hyperlipidemia:  Almost at goal.  Hold on medication until done having children.  4. Systolic Ejection Murmur:  Likely not able to obtain Echo through [facility], but will see first.  Will most likely have to apply for financial asst. With [facility] and have done there. Probably RVH on EKG.  5.  Thyromegaly:  TSH today.  Follow up in 3-4 months for CPE with pap

## 2016-04-07 LAB — TSH: TSH: 0.829 u[IU]/mL (ref 0.450–4.500)

## 2016-06-12 ENCOUNTER — Other Ambulatory Visit: Payer: Self-pay

## 2016-06-12 ENCOUNTER — Other Ambulatory Visit: Payer: Self-pay | Admitting: Internal Medicine

## 2016-06-12 DIAGNOSIS — E119 Type 2 diabetes mellitus without complications: Secondary | ICD-10-CM

## 2016-06-12 MED ORDER — METFORMIN HCL 500 MG PO TABS: 500.0000 mg | ORAL_TABLET | Freq: Two times a day (BID) | ORAL | 11 refills | Status: DC

## 2016-06-12 NOTE — Telephone Encounter (Signed)
Rx faxed to pharmacy  

## 2016-06-29 ENCOUNTER — Ambulatory Visit
Admission: RE | Admit: 2016-06-29 | Discharge: 2016-06-29 | Disposition: A | Discharge status: Home / Self Care | Payer: No Typology Code available for payment source | Source: Ambulatory Visit | Attending: Infectious Disease | Admitting: Infectious Disease

## 2016-06-29 ENCOUNTER — Other Ambulatory Visit: Payer: Self-pay | Admitting: Infectious Disease

## 2016-06-29 DIAGNOSIS — Z111 Encounter for screening for respiratory tuberculosis: Secondary | ICD-10-CM

## 2016-07-01 ENCOUNTER — Other Ambulatory Visit: Payer: Self-pay

## 2016-07-01 ENCOUNTER — Ambulatory Visit (HOSPITAL_COMMUNITY): Payer: No Typology Code available for payment source | Attending: Internal Medicine

## 2016-07-01 DIAGNOSIS — E119 Type 2 diabetes mellitus without complications: Secondary | ICD-10-CM | POA: Insufficient documentation

## 2016-07-01 DIAGNOSIS — E785 Hyperlipidemia, unspecified: Secondary | ICD-10-CM | POA: Insufficient documentation

## 2016-07-01 DIAGNOSIS — I351 Nonrheumatic aortic (valve) insufficiency: Secondary | ICD-10-CM | POA: Insufficient documentation

## 2016-07-01 DIAGNOSIS — I071 Rheumatic tricuspid insufficiency: Secondary | ICD-10-CM | POA: Insufficient documentation

## 2016-07-01 DIAGNOSIS — R011 Cardiac murmur, unspecified: Secondary | ICD-10-CM | POA: Insufficient documentation

## 2016-07-01 LAB — ECHOCARDIOGRAM COMPLETE
AVLVOTPG: 4 mmHg
AVPHT: 674 ms
CHL CUP MV DEC (S): 155
E decel time: 155 msec
E/e' ratio: 7.02
FS: 29 % (ref 28–44)
IV/PV OW: 0.63
LA ID, A-P, ES: 32 mm
LA diam end sys: 32 mm
LA vol index: 24.8 mL/m2
LA vol: 52 mL
LADIAMINDEX: 1.53 cm/m2
LAVOLA4C: 41 mL
LDCA: 3.14 cm2
LV PW d: 12 mm — AB (ref 0.6–1.1)
LV TDI E'LATERAL: 13.3
LV TDI E'MEDIAL: 7.35
LVEEAVG: 7.02
LVEEMED: 7.02
LVELAT: 13.3 cm/s
LVOT VTI: 22.3 cm
LVOT diameter: 20 mm
LVOT peak vel: 105 cm/s
LVOTSV: 70 mL
MV pk A vel: 82.4 m/s
MVPG: 3 mmHg
MVPKEVEL: 93.3 m/s
RV LATERAL S' VELOCITY: 13.5 cm/s
RV sys press: 25 mmHg
Reg peak vel: 235 cm/s
TR max vel: 235 cm/s
TVPG: 235 mmHg

## 2016-07-10 NOTE — Progress Notes (Signed)
Left detailed message for patient.

## 2016-08-07 ENCOUNTER — Ambulatory Visit (INDEPENDENT_AMBULATORY_CARE_PROVIDER_SITE_OTHER): Payer: Self-pay | Admitting: Internal Medicine

## 2016-08-07 ENCOUNTER — Encounter: Payer: Self-pay | Admitting: Internal Medicine

## 2016-08-07 VITALS — BP 124/82 | HR 78 | Resp 12 | Ht 62.5 in | Wt 197.0 lb

## 2016-08-07 DIAGNOSIS — N889 Noninflammatory disorder of cervix uteri, unspecified: Secondary | ICD-10-CM

## 2016-08-07 DIAGNOSIS — Z124 Encounter for screening for malignant neoplasm of cervix: Secondary | ICD-10-CM

## 2016-08-07 DIAGNOSIS — E010 Iodine-deficiency related diffuse (endemic) goiter: Secondary | ICD-10-CM

## 2016-08-07 DIAGNOSIS — R7611 Nonspecific reaction to tuberculin skin test without active tuberculosis: Secondary | ICD-10-CM

## 2016-08-07 DIAGNOSIS — Z0000 Encounter for general adult medical examination without abnormal findings: Secondary | ICD-10-CM

## 2016-08-07 DIAGNOSIS — Z113 Encounter for screening for infections with a predominantly sexual mode of transmission: Secondary | ICD-10-CM

## 2016-08-07 DIAGNOSIS — K056 Periodontal disease, unspecified: Secondary | ICD-10-CM

## 2016-08-07 LAB — POCT WET PREP WITH KOH
KOH Prep POC: NEGATIVE
RBC WET PREP PER HPF POC: NEGATIVE
Trichomonas, UA: NEGATIVE
Yeast Wet Prep HPF POC: NEGATIVE

## 2016-08-07 NOTE — Progress Notes (Signed)
Subjective:    Patient ID: [name], female    DOB: April 23, 1981, 36 y.o.   MRN: [id number]  HPI   CPE with pap  1.  Pap:  Thinks had a pap smear with Dr. Gaynell Face in 2016 sometime during or after pregnancy, but we are unable to obtain her records from his office currently. No family history of cervical cancer  2.  Mammogram:  Never.  No family history of breast cancer.  3.  Osteoprevention: Drinks 2% milk 1 cup daily.  Has diarrhea if drinks too much.  Walks 1-1.5 hours daily in the morning.  4.  Guaiac Cards:  Never.  No family history of colon cancer.  5.  Colonoscopy:  Never.    6.  Immunizations:  Did not get influenza vaccine this year at one of our clinics and we are out now. We will have some next week. Immunization History  Administered Date(s) Administered  . Influenza-Unspecified 07/03/2015  . Pneumococcal Polysaccharide-23 04/06/2016  . Tdap 03/06/2015       Review of Systems  Constitutional: Negative for fatigue and fever.       Has been working on weight loss and is down 11 lbs  HENT: Positive for dental problem. Negative for ear pain, hearing loss and sinus pain.        Needs teeth replaced  Eyes: Negative for visual disturbance.  Respiratory: Negative for cough and shortness of breath.   Cardiovascular: Negative for chest pain, palpitations and leg swelling.  Gastrointestinal: Negative for abdominal pain, blood in stool, constipation, diarrhea, nausea and vomiting.       No melena  Endocrine: Positive for polyphagia and polyuria.  Genitourinary: Negative for dysuria, hematuria, vaginal bleeding and vaginal discharge.  Musculoskeletal: Negative for arthralgias.  Skin: Negative for rash.       No skin lesions with significant change  Neurological: Negative for dizziness, weakness, numbness and headaches.  Hematological: Negative for adenopathy. Does not bruise/bleed easily.  Psychiatric/Behavioral: Negative for dysphoric mood and suicidal ideas. The  patient is not nervous/anxious.        Objective:   Physical Exam  Constitutional: She is oriented to person, place, and time. She appears well-developed and well-nourished.  HENT:  Head: Normocephalic and atraumatic.  Right Ear: Hearing, tympanic membrane, external ear and ear canal normal.  Left Ear: Hearing, tympanic membrane, external ear and ear canal normal.  Nose: Nose normal.  Mouth/Throat: Uvula is midline, oropharynx is clear and moist and mucous membranes are normal. No oropharyngeal exudate.  Missing lower central incisors.  Much plaque at gingival edge of teeth with gingival recession.   Wide gaps with incisor uppers.  Eyes: Conjunctivae and EOM are normal. Pupils are equal, round, and reactive to light.  Discs sharp without exudate or hemorrhage  Neck: Normal range of motion. Neck supple. Thyromegaly present.  Thyroid large and firm.  Possibly a bit irregular surface  Cardiovascular: Normal rate, regular rhythm, S1 normal and S2 normal.  Exam reveals no S3, no S4 and no friction rub.   Murmur heard.  Systolic murmur is present with a grade of 2/6  Murmur mainly heard at left sternal border and left axillary area not heard well at apex.  No carotid bruits.  Carotid, radial, femoral, DP and PT pulses normal and equal.   Pulmonary/Chest: Effort normal and breath sounds normal. Right breast exhibits no inverted nipple, no mass, no nipple discharge, no skin change and no tenderness. Left breast exhibits no inverted nipple, no mass,  no nipple discharge, no skin change and no tenderness.  Abdominal: Soft. Bowel sounds are normal. She exhibits no mass. There is no hepatosplenomegaly. There is no tenderness. No hernia.  Genitourinary: Vagina normal and uterus normal. Cervix exhibits no motion tenderness and no discharge. Right adnexum displays no mass and no tenderness. Left adnexum displays no mass and no tenderness. No vaginal discharge found.    Musculoskeletal: Normal range of  motion.  Lymphadenopathy:       Head (right side): No submental and no submandibular adenopathy present.       Head (left side): No submental and no submandibular adenopathy present.    She has no cervical adenopathy.    She has no axillary adenopathy.       Right: No inguinal and no supraclavicular adenopathy present.       Left: No inguinal and no supraclavicular adenopathy present.  Neurological: She is alert and oriented to person, place, and time. She has normal strength and normal reflexes. No cranial nerve deficit or sensory deficit. Coordination and gait normal.  Diabetic Foot Exam - Simple   Simple Foot Form Diabetic Foot exam was performed with the following findings:  Yes  08/07/2016 11:37 AM  Visual Inspection No deformities, no ulcerations, no other skin breakdown bilaterally:  Yes Sensation Testing Intact to touch and monofilament testing bilaterally:  Yes Pulse Check Posterior Tibialis and Dorsalis pulse intact bilaterally:  Yes Comments    Skin: Skin is warm and dry. No rash noted.  Psychiatric: She has a normal mood and affect. Her speech is normal and behavior is normal. Judgment and thought content normal. Cognition and memory are normal.       Assessment & Plan:  1.  CPE with pap Will reevaluate cervical lesion in 1-2 months to see if resolved.  If not, referral to Gyn for further evaluation. GC/Chlamydia To return for fasting labs which will include HIV, RPR. Wet prep today without Trichomonas.   2. Thyromegaly:  Thyroid ultrasound.   Will see if can get soon as beginning of year, if not, will [facility] and have apply for assistance. TSH 03/2016 was normal  3.  + PPD/Latent TB:  To be treated soon through [facility].  Discussed she needs to call [facility] if she does not hear anything by end of month.  4.  Dental loss and periodontal disease:  Will refer to Dental Clinic, but new nonemergent referrals currently on hold.  5.  DM: to return for fasting labs: FLP, CBC,  CMP, urine microalbumin/crea,  Has been well controlled and making good lifestyle changes Eye referral. Should have an influenza vaccine for her next Wednesday  6.  Heart Murmur:  No findings to explain murmur on echo.  Asymptomatic.

## 2016-08-10 LAB — GC/CHLAMYDIA PROBE AMP
CHLAMYDIA, DNA PROBE: NEGATIVE
Neisseria gonorrhoeae by PCR: NEGATIVE

## 2016-08-10 LAB — CYTOLOGY - PAP

## 2016-08-12 ENCOUNTER — Ambulatory Visit (INDEPENDENT_AMBULATORY_CARE_PROVIDER_SITE_OTHER): Payer: Self-pay | Admitting: Internal Medicine

## 2016-08-12 ENCOUNTER — Encounter: Payer: Self-pay | Admitting: Internal Medicine

## 2016-08-12 VITALS — BP 130/88 | HR 90 | Resp 12 | Ht 63.0 in | Wt 198.0 lb

## 2016-08-12 DIAGNOSIS — R0789 Other chest pain: Secondary | ICD-10-CM

## 2016-08-12 MED ORDER — IBUPROFEN 600 MG PO TABS: ORAL_TABLET | ORAL | 0 refills | Status: DC

## 2016-08-12 NOTE — Patient Instructions (Signed)
Call if the pain worsens or does not improve Stop Ibuprofen if you get stomach pain or upset Hold a pillow against your chest when you take deep breaths, sneeze or cough

## 2016-08-12 NOTE — Progress Notes (Signed)
Called patient no answer and no voicemail  

## 2016-08-12 NOTE — Progress Notes (Signed)
Called patient and no answer and no voicemail

## 2016-08-12 NOTE — Progress Notes (Signed)
   Subjective:    Patient ID: Alice LofflerHassia Keats, female    DOB: 05/03/1981, 36 y.o.   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 States this is not new, but she wanted to get done and was pressing harder on the mop than usual to finish faster.   Now with any movement of her shoulder or breathing, hurts--sharp pain.   Very anxious. No cough, fever, diaphoresis, nausea.   Took Naproxen 500 mg at 2 p.m, but pain returned about an hour later.   Discussed with her that Aleve is Naproxen, which she has had nausea and vomiting with.  Current Meds  Medication Sig  . Blood Glucose Monitoring Suppl (AGAMATRIX PRESTO PRO METER) DEVI 2 times daily glucose checks  . etonogestrel (NEXPLANON) 68 MG IMPL implant 1 each by Subdermal route once.  . ferrous sulfate 325 (65 FE) MG tablet Take 325 mg by mouth daily with breakfast. Reported on 11/04/2015  . gabapentin (NEURONTIN) 300 MG capsule Take 1 capsule (300 mg total) by mouth 2 (two) times daily.  Marland Kitchen. glucose blood (AGAMATRIX PRESTO TEST) test strip Twice daily glucose checks  . metFORMIN (GLUCOPHAGE) 500 MG tablet Take 1 tablet (500 mg total) by mouth 2 (two) times daily with a meal.    Allergies  Allergen Reactions  . Naproxen Nausea And Vomiting     Review of Systems     Objective:   Physical Exam HEENT:  PERRL, EOMI, MMM Neck:  Supple, No adenopathy Chest:  CTA.  Very tender with palpation of left pectoral muscles.  Pain with twisting of torso and flexing/abducting shoulder--pectoral insertion on humerus quite tender as well.  NT over sternum, costochondral area.  Improved pain with respiration with cushion held against chest. CV:  RRR with normal S1 and S2, No S3, S4.  Grade 2/6 SEM LE:  No edema        Assessment & Plan:  1.  Left mainly pectoral muscle pain/chest wall pain:  Ibuprofen 600 mg  with food every 6 hours as needed for pain.  Splinting with pillow if cough, sneeze, deep breathe.   Recommend deep breathing in intervals throughout day. Letter for work off x2 days

## 2016-08-12 NOTE — Progress Notes (Signed)
Discussed results with patient in office 

## 2016-08-12 NOTE — Progress Notes (Signed)
Discussed results with patient in office

## 2016-08-13 ENCOUNTER — Other Ambulatory Visit (INDEPENDENT_AMBULATORY_CARE_PROVIDER_SITE_OTHER): Payer: Self-pay

## 2016-08-13 DIAGNOSIS — Z113 Encounter for screening for infections with a predominantly sexual mode of transmission: Secondary | ICD-10-CM

## 2016-08-13 DIAGNOSIS — E785 Hyperlipidemia, unspecified: Secondary | ICD-10-CM

## 2016-08-13 DIAGNOSIS — E119 Type 2 diabetes mellitus without complications: Secondary | ICD-10-CM

## 2016-08-13 DIAGNOSIS — Z0000 Encounter for general adult medical examination without abnormal findings: Secondary | ICD-10-CM

## 2016-08-14 LAB — COMPREHENSIVE METABOLIC PANEL
A/G RATIO: 1.5 (ref 1.2–2.2)
ALK PHOS: 62 IU/L (ref 39–117)
ALT: 10 IU/L (ref 0–32)
AST: 13 IU/L (ref 0–40)
Albumin: 4.2 g/dL (ref 3.5–5.5)
BILIRUBIN TOTAL: 0.4 mg/dL (ref 0.0–1.2)
BUN/Creatinine Ratio: 18 (ref 9–23)
BUN: 6 mg/dL (ref 6–20)
CHLORIDE: 103 mmol/L (ref 96–106)
CO2: 24 mmol/L (ref 18–29)
Calcium: 9.2 mg/dL (ref 8.7–10.2)
Creatinine, Ser: 0.33 mg/dL — ABNORMAL LOW (ref 0.57–1.00)
GFR calc non Af Amer: 141 mL/min/{1.73_m2} (ref >59)
GFR, EST AFRICAN AMERICAN: 163 mL/min/{1.73_m2} (ref >59)
Globulin, Total: 2.8 g/dL (ref 1.5–4.5)
Glucose: 117 mg/dL — ABNORMAL HIGH (ref 65–99)
POTASSIUM: 4.1 mmol/L (ref 3.5–5.2)
Sodium: 142 mmol/L (ref 134–144)
Total Protein: 7 g/dL (ref 6.0–8.5)

## 2016-08-14 LAB — CBC WITH DIFFERENTIAL/PLATELET
BASOS: 0 %
Basophils Absolute: 0 10*3/uL (ref 0.0–0.2)
EOS (ABSOLUTE): 0.1 10*3/uL (ref 0.0–0.4)
Eos: 1 %
Hematocrit: 36.3 % (ref 34.0–46.6)
Hemoglobin: 11.9 g/dL (ref 11.1–15.9)
Immature Grans (Abs): 0 10*3/uL (ref 0.0–0.1)
Immature Granulocytes: 0 %
Lymphocytes Absolute: 1.6 10*3/uL (ref 0.7–3.1)
Lymphs: 38 %
MCH: 27.5 pg (ref 26.6–33.0)
MCHC: 32.8 g/dL (ref 31.5–35.7)
MCV: 84 fL (ref 79–97)
MONOS ABS: 0.4 10*3/uL (ref 0.1–0.9)
Monocytes: 10 %
NEUTROS ABS: 2 10*3/uL (ref 1.4–7.0)
Neutrophils: 51 %
PLATELETS: 247 10*3/uL (ref 150–379)
RBC: 4.32 x10E6/uL (ref 3.77–5.28)
RDW: 14 % (ref 12.3–15.4)
WBC: 4.1 10*3/uL (ref 3.4–10.8)

## 2016-08-14 LAB — RPR, QUANT. (REFLEX)

## 2016-08-14 LAB — RPR QUALITATIVE: RPR Ser Ql: REACTIVE — AB

## 2016-08-14 LAB — LIPID PANEL W/O CHOL/HDL RATIO
CHOLESTEROL TOTAL: 143 mg/dL (ref 100–199)
HDL: 44 mg/dL (ref >39)
LDL CALC: 86 mg/dL (ref 0–99)
TRIGLYCERIDES: 64 mg/dL (ref 0–149)
VLDL CHOLESTEROL CAL: 13 mg/dL (ref 5–40)

## 2016-08-14 LAB — HGB A1C W/O EAG: HEMOGLOBIN A1C: 7 % — AB (ref 4.8–5.6)

## 2016-08-17 ENCOUNTER — Telehealth: Payer: Self-pay | Admitting: Internal Medicine

## 2016-08-17 ENCOUNTER — Ambulatory Visit: Payer: Self-pay | Admitting: Internal Medicine

## 2016-08-17 NOTE — Telephone Encounter (Signed)
Elfredia Nevinsorey Barr of [facility] wants to discuss lab results of patient.  Wants nurse to call.  [phone]

## 2016-08-17 NOTE — Telephone Encounter (Signed)
Spoke with Dr. Delrae AlfredMulberry and yes she will do a confirmatory test to detect positive result. Left message for corey informing him we will be doing additional testing. Spoke with patient and she is scheduled to come in for lab appointment on 08/20/16 @ 9 am. Patient is aware of $20 fee.

## 2016-08-18 NOTE — Addendum Note (Signed)
Addended by: Marcene DuosMULBERRY, Ersel Enslin M on: 08/18/2016 11:41 AM   Modules accepted: Orders

## 2016-08-20 ENCOUNTER — Other Ambulatory Visit (INDEPENDENT_AMBULATORY_CARE_PROVIDER_SITE_OTHER): Payer: Self-pay

## 2016-08-20 DIAGNOSIS — Z113 Encounter for screening for infections with a predominantly sexual mode of transmission: Secondary | ICD-10-CM

## 2016-08-20 NOTE — Progress Notes (Signed)
Discussed lab results with patient at office visit today.

## 2016-08-22 LAB — FLUORESCENT TREPONEMAL AB(FTA)-IGG-BLD: FLUORESCENT TREPONEMAL AB, IGG: NONREACTIVE

## 2016-08-23 ENCOUNTER — Encounter: Payer: Self-pay | Admitting: Internal Medicine

## 2016-08-23 DIAGNOSIS — R768 Other specified abnormal immunological findings in serum: Secondary | ICD-10-CM

## 2016-08-23 HISTORY — DX: Other specified abnormal immunological findings in serum: R76.8

## 2016-08-25 NOTE — Progress Notes (Signed)
Spoke with patient.  Results given.

## 2016-10-05 ENCOUNTER — Encounter: Payer: Self-pay | Admitting: Internal Medicine

## 2016-10-05 ENCOUNTER — Ambulatory Visit (INDEPENDENT_AMBULATORY_CARE_PROVIDER_SITE_OTHER): Payer: Self-pay | Admitting: Internal Medicine

## 2016-10-05 VITALS — BP 118/72 | HR 72 | Resp 12 | Ht 63.0 in | Wt 194.0 lb

## 2016-10-05 DIAGNOSIS — E119 Type 2 diabetes mellitus without complications: Secondary | ICD-10-CM

## 2016-10-05 DIAGNOSIS — E785 Hyperlipidemia, unspecified: Secondary | ICD-10-CM

## 2016-10-05 DIAGNOSIS — N889 Noninflammatory disorder of cervix uteri, unspecified: Secondary | ICD-10-CM

## 2016-10-05 DIAGNOSIS — E010 Iodine-deficiency related diffuse (endemic) goiter: Secondary | ICD-10-CM

## 2016-10-05 NOTE — Progress Notes (Signed)
   Subjective:    Patient ID: [name] Harris, female    DOB: 07/31/1980, 36 y.o.   MRN: [id number]  HPI   1.  Cervical lesion:  Patient did not have follow up appt. For this for some reason.  Will check today.  2.  DM:  Checking sugars 4-5 times weekly twice daily.  150s generally.  Has been as high as 204.  Daughter sits in room snacking on Cheetos early morning.  Discussed health diet.   Oatmeal or corn flakes plus water No fruit on cereal.   Bag of mixed veggies. Brown rice Lamb Wheat bread,Yogurt, salad  States she is walking daily for 1 hour.  Has lost 4 lbs since end of January. Testing last month with A1C at 7.0% and cholesterol panel much improved and almost at goal with LDL of 84.  3.  Pain syndrome of left arm:  Gabapentin has helped with sleep and general decrease in pain.  Current Meds  Medication Sig  . Blood Glucose Monitoring Suppl (AGAMATRIX PRESTO PRO METER) DEVI 2 times daily glucose checks  . etonogestrel (NEXPLANON) 68 MG IMPL implant 1 each by Subdermal route once.  . gabapentin (NEURONTIN) 300 MG capsule Take 1 capsule (300 mg total) by mouth 2 (two) times daily.  Marland Kitchen. glucose blood (AGAMATRIX PRESTO TEST) test strip Twice daily glucose checks  . ibuprofen (ADVIL,MOTRIN) 600 MG tablet 1 tab by mouth every 6 hours as needed for pain.  Take with food.  . metFORMIN (GLUCOPHAGE) 500 MG tablet Take 1 tablet (500 mg total) by mouth 2 (two) times daily with a meal.   Allergies  Allergen Reactions  . Naproxen Nausea And Vomiting        Review of Systems     Objective:   Physical Exam NAD Lungs:  CTA CV: RRR without murmur or rub Cervix:  Pedunculated mass on narrow stalk possibly a bit larger.  No bleeding.  Not clear if emanating from os or from face of cervix at about 2 O'clock as appeared previously.       Assessment & Plan:  1.  Cervical growth:  [facility] and treatment.  2.  DM:  Labs last month show good control.   She is losing weight and describes a healthy diet.  Encouraged her to have her daughter eat a healthy diet as well as she is at a higher risk for DM.  Needs to develop good habits now. Continue present management and follow up in 4 months.  3.  Dyslipidemia; improved with labs last month as well.  4.  Pain syndrome of left arm:  Controlled with gabapentin.  5.  Thyromegaly: still has not obtained ultrasound of thyroid. [facility].  If not available and does get financial assistance [facility], will order through  [facility].
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
## 2017-01-21 NOTE — Addendum Note (Signed)
Addended by: Marcelino FreestoneHAZLEY, Rachal Dvorsky on: 01/21/2017 04:41 PM   Modules accepted: Orders

## 2017-02-05 ENCOUNTER — Ambulatory Visit (INDEPENDENT_AMBULATORY_CARE_PROVIDER_SITE_OTHER): Payer: Self-pay | Admitting: Internal Medicine

## 2017-02-05 ENCOUNTER — Encounter: Payer: Self-pay | Admitting: Internal Medicine

## 2017-02-05 VITALS — BP 132/78 | HR 70 | Resp 12 | Ht 63.0 in | Wt 178.0 lb

## 2017-02-05 DIAGNOSIS — E119 Type 2 diabetes mellitus without complications: Secondary | ICD-10-CM

## 2017-02-05 DIAGNOSIS — E041 Nontoxic single thyroid nodule: Secondary | ICD-10-CM

## 2017-02-05 DIAGNOSIS — R7611 Nonspecific reaction to tuberculin skin test without active tuberculosis: Secondary | ICD-10-CM

## 2017-02-05 DIAGNOSIS — N889 Noninflammatory disorder of cervix uteri, unspecified: Secondary | ICD-10-CM

## 2017-02-05 HISTORY — DX: Nontoxic single thyroid nodule: E04.1

## 2017-02-05 MED ORDER — GLUCOSE BLOOD VI STRP: ORAL_STRIP | 12 refills | Status: DC

## 2017-02-05 NOTE — Progress Notes (Signed)
   Subjective:    Patient ID: Alice LofflerHassia Harris, female    DOB: 04/19/1981, 36 y.o.   MRN: [id number]  HPI   1.  Cervical Lesion:  [facility] get [facility].  Apparently no openings until September at this point and needs to be evaluated by physician.  Later, patient states she received a letter from retiring Dr. Elsie StainMarshall's office. She cannot recall the name of the doctor he is referring her to.  She is planning to make that appt.  Discussed we can send her notes from January visit to this office and have her cervix evaluated there.    2.  Complex isthmus thyroid nodule 3.1 x 2.1 x 3.0.  Patient constantly playing with her thyroid area throughout visit.  Has innumerable subcentimeter nodules as well.   3.  +PPD during pregnancy.  [facility].  Finishes in 12.2018.  No problems with the medication.  3.  DM:  States sugars running 150-160.  Checks 3 days weekly.  Always in the morning. Has lost 16 lbs since last visit.  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  Getting lots of exercise with this job.   Not sure when had last eye exam.  Appears more than 1 year.       Review of Systems     Objective:   Physical Exam NAD HEENT:  Has upper and lower new partials Neck:  Supple with Nodular enlarged thyroid.  Nodule extend forward from right midline anterior neck. Lungs:  CTA CV:  RRR with normal S1 and S2, No S3, or S4.  Grade 2-3 systolic murmur along LSB. Radial and DP pulses normal and equal Abd:  S, NT, No HSM or mass, + BS        Assessment & Plan:  1.  Cervical Lesion:  [facility] hear [facility], however, patient now interested in going to the gynecologist Dr. Elsie StainMarshall's office has recommended.  She will call us with the name of the office and we can forward the information  2.  Thyroid nodule:  Will look into [facility] for [facility].  Suspect she will have to  go to [facility] and apply for financial assistance.  3.  DM:  Has been controlled and patient has had good weight loss with increased physical activity.  A1C today. Eye referral  4.  Latent TB:  Reportedly will complete 9 month treatment in December with [facility].

## 2017-02-06 LAB — HGB A1C W/O EAG: Hgb A1c MFr Bld: 6.9 % — ABNORMAL HIGH (ref 4.8–5.6)

## 2017-02-26 ENCOUNTER — Telehealth: Payer: Self-pay | Admitting: Internal Medicine

## 2017-02-26 MED ORDER — GABAPENTIN 300 MG PO CAPS: 300.0000 mg | ORAL_CAPSULE | Freq: Two times a day (BID) | ORAL | 11 refills | Status: DC

## 2017-02-26 NOTE — Telephone Encounter (Signed)
Refill for gabapentin

## 2017-03-02 ENCOUNTER — Encounter: Payer: Self-pay | Admitting: Obstetrics & Gynecology

## 2017-04-07 ENCOUNTER — Encounter: Payer: Self-pay | Admitting: Obstetrics & Gynecology

## 2017-04-08 ENCOUNTER — Encounter: Payer: Self-pay | Admitting: Obstetrics & Gynecology

## 2017-04-08 ENCOUNTER — Ambulatory Visit (INDEPENDENT_AMBULATORY_CARE_PROVIDER_SITE_OTHER): Payer: BLUE CROSS/BLUE SHIELD | Admitting: Obstetrics & Gynecology

## 2017-04-08 VITALS — BP 116/75 | HR 80 | Wt 182.2 lb

## 2017-04-08 DIAGNOSIS — N888 Other specified noninflammatory disorders of cervix uteri: Secondary | ICD-10-CM | POA: Diagnosis not present

## 2017-04-08 NOTE — Progress Notes (Signed)
History:  36 y.o. [id number] here today for eval of lesion on cervix. She was referred from the [facility].  She denies sx.She reports that it was seen during a routine exam.  The following portions of the patient's history were reviewed and updated as appropriate: allergies, current medications, past family history, past medical history, past social history, past surgical history and problem list.  Review of Systems:  Pertinent items are noted in HPI.   Objective:  Physical Exam Blood pressure 116/75, pulse 80, weight 182 lb 3.2 oz (82.6 kg). Gen: NAD Abd: Soft, nontender and nondistended Pelvic: Normal appearing external genitalia; normal appearing vaginal mucosa. The cervix contained a large pedunculated Nabothian cyst. This was removed with ringed forceps. Normal discharge.   Assessment & Plan:  Large Nabothian cyst - removed.   NO further management needed.  F/u prn  Olen Eaves L. Harraway-Smith, M.D., Evern Core

## 2017-04-11 ENCOUNTER — Encounter: Payer: Self-pay | Admitting: Obstetrics & Gynecology

## 2017-04-27 ENCOUNTER — Encounter: Payer: Self-pay | Admitting: Internal Medicine

## 2017-04-27 ENCOUNTER — Telehealth: Payer: Self-pay | Admitting: Internal Medicine

## 2017-04-27 ENCOUNTER — Ambulatory Visit (INDEPENDENT_AMBULATORY_CARE_PROVIDER_SITE_OTHER): Payer: Self-pay | Admitting: Internal Medicine

## 2017-04-27 VITALS — BP 122/80 | HR 74 | Resp 14 | Ht 63.0 in | Wt 183.0 lb

## 2017-04-27 DIAGNOSIS — Z6832 Body mass index (BMI) 32.0-32.9, adult: Secondary | ICD-10-CM

## 2017-04-27 DIAGNOSIS — E785 Hyperlipidemia, unspecified: Secondary | ICD-10-CM

## 2017-04-27 DIAGNOSIS — E041 Nontoxic single thyroid nodule: Secondary | ICD-10-CM

## 2017-04-27 DIAGNOSIS — E669 Obesity, unspecified: Secondary | ICD-10-CM | POA: Diagnosis not present

## 2017-04-27 DIAGNOSIS — Z23 Encounter for immunization: Secondary | ICD-10-CM | POA: Diagnosis not present

## 2017-04-27 DIAGNOSIS — E119 Type 2 diabetes mellitus without complications: Secondary | ICD-10-CM

## 2017-04-27 HISTORY — DX: Hyperlipidemia, unspecified: E78.5

## 2017-04-27 MED ORDER — BLOOD GLUCOSE MONITOR KIT: PACK | 0 refills | Status: DC

## 2017-04-27 MED ORDER — GABAPENTIN 300 MG PO CAPS: 300.0000 mg | ORAL_CAPSULE | Freq: Two times a day (BID) | ORAL | 11 refills | Status: DC

## 2017-04-27 MED ORDER — GLUCOSE BLOOD VI STRP: ORAL_STRIP | 12 refills | Status: DC

## 2017-04-27 MED ORDER — METFORMIN HCL 500 MG PO TABS: 500.0000 mg | ORAL_TABLET | Freq: Two times a day (BID) | ORAL | 11 refills | Status: DC

## 2017-04-27 NOTE — Patient Instructions (Addendum)
Dr. Marisue Ivan Dewey--Family Practice on [address] between [place] and [address] [place] at [facility] or [place] at [facility] or [place] at [place] (the last two are in [facility] I think on the 2nd floor)  After discussion with admin and staff, will continue to see established patients who then get insurance.

## 2017-04-27 NOTE — Progress Notes (Signed)
   Subjective:    Patient ID: [name], female    DOB: 1980/12/09, 36 y.o.   MRN: [id number]  HPI   1.  DM:  Last A1C was 6.9% end of July.  Checking sugars twice daily.  Almost always in 150 range.  Has not had flu shot this year.   No eye check yet. Checking feet nightly. Needs urine microalbumin.  Now has insurance through work at [employer] and does not live in [address].   Needs meds moved to a different pharmacy as can no longer use [facility] with no orange card.  2.  HM:  Has not had 1 time HIV screening.  3.  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  Her 3 yo daughter encourages her to be physically active.   Feels she is eating in a healthy way.  Good with fruits and veggies.  Discussed her weight is creeping up.  Current Meds  Medication Sig  . Blood Glucose Monitoring Suppl (AGAMATRIX PRESTO PRO METER) DEVI 2 times daily glucose checks  . etonogestrel (NEXPLANON) 68 MG IMPL implant 1 each by Subdermal route once.  . gabapentin (NEURONTIN) 300 MG capsule Take 1 capsule (300 mg total) by mouth 2 (two) times daily.  Marland Kitchen glucose blood (AGAMATRIX PRESTO TEST) test strip Twice daily glucose checks  . ibuprofen (ADVIL,MOTRIN) 600 MG tablet 1 tab by mouth every 6 hours as needed for pain.  Take with food.  . isoniazid (NYDRAZID) 300 MG tablet Take 300 mg by mouth daily.  . metFORMIN (GLUCOPHAGE) 500 MG tablet Take 1 tablet (500 mg total) by mouth 2 (two) times daily with a meal.  . vitamin B-6 (PYRIDOXINE) 25 MG tablet Take 25 mg by mouth daily.    Allergies  Allergen Reactions  . Naproxen Nausea And Vomiting    Review of Systems     Objective:   Physical Exam  NAD HEENT: PERRL, EOMI, Neck:  Nodular thyroid goiter, supple, No adenopathy Chest:  CTA CV:  RRR with grade II-III/VI Systolic murmur--heard best LSB, sitting or lying.  Radial and DP/PT pulses normal and equal Abd:  S,NT, No HSM or mass, +  BS LE:  No edema.  Feet without lesion.        Assessment & Plan:  1.  DM:  Has been well controlled.  A1C and urine microalbumin/crea when returns for fasting labs. Flu vaccine today Eye referral to Dr. Laruth Bouchard office.   2.  Heart Murmur:  No concerning findings on Echo in 2017.  Only some mild tricuspid regurgitation.  3.  Complex Thyroid nodule and multinodular goiter.  Have not been able to get in through orange card for FNA of the complex nodule. Will send into [facility] where ultrasound performed in May.  Repeat TSH with fasting labs.  4.  Elevated LDL:  Was not at goal with LDL in February.  Return for FLP this week.  5.  Obesity:  Encouraged her to walk with her daughter as often as daily and work up to 60 minutes.  6.  Latent TB:  Treatment with Isoniazid continues through December 2018 through [facility]

## 2017-05-04 ENCOUNTER — Other Ambulatory Visit (INDEPENDENT_AMBULATORY_CARE_PROVIDER_SITE_OTHER): Payer: BLUE CROSS/BLUE SHIELD

## 2017-05-04 DIAGNOSIS — E119 Type 2 diabetes mellitus without complications: Secondary | ICD-10-CM

## 2017-05-04 DIAGNOSIS — E041 Nontoxic single thyroid nodule: Secondary | ICD-10-CM

## 2017-05-04 DIAGNOSIS — E785 Hyperlipidemia, unspecified: Secondary | ICD-10-CM

## 2017-05-04 DIAGNOSIS — Z79899 Other long term (current) drug therapy: Secondary | ICD-10-CM

## 2017-05-05 ENCOUNTER — Telehealth: Payer: Self-pay | Admitting: Internal Medicine

## 2017-05-05 DIAGNOSIS — E041 Nontoxic single thyroid nodule: Secondary | ICD-10-CM

## 2017-05-05 LAB — MICROALBUMIN / CREATININE URINE RATIO
Creatinine, Urine: 96.2 mg/dL
MICROALB/CREAT RATIO: 3.1 mg/g{creat} (ref 0.0–30.0)
Microalbumin, Urine: 3 ug/mL

## 2017-05-05 LAB — COMPREHENSIVE METABOLIC PANEL
ALT: 21 IU/L (ref 0–32)
AST: 17 IU/L (ref 0–40)
Albumin/Globulin Ratio: 1.3 (ref 1.2–2.2)
Albumin: 4.1 g/dL (ref 3.5–5.5)
Alkaline Phosphatase: 91 IU/L (ref 39–117)
BUN / CREAT RATIO: 14 (ref 9–23)
BUN: 6 mg/dL (ref 6–20)
Bilirubin Total: 0.3 mg/dL (ref 0.0–1.2)
CALCIUM: 9.5 mg/dL (ref 8.7–10.2)
CHLORIDE: 104 mmol/L (ref 96–106)
CO2: 22 mmol/L (ref 20–29)
CREATININE: 0.44 mg/dL — AB (ref 0.57–1.00)
GFR calc Af Amer: 148 mL/min/{1.73_m2} (ref >59)
GFR, EST NON AFRICAN AMERICAN: 128 mL/min/{1.73_m2} (ref >59)
GLOBULIN, TOTAL: 3.1 g/dL (ref 1.5–4.5)
Glucose: 118 mg/dL — ABNORMAL HIGH (ref 65–99)
Potassium: 4.5 mmol/L (ref 3.5–5.2)
Sodium: 142 mmol/L (ref 134–144)
Total Protein: 7.2 g/dL (ref 6.0–8.5)

## 2017-05-05 LAB — HGB A1C W/O EAG: Hgb A1c MFr Bld: 7.2 % — ABNORMAL HIGH (ref 4.8–5.6)

## 2017-05-05 LAB — LIPID PANEL W/O CHOL/HDL RATIO
CHOLESTEROL TOTAL: 140 mg/dL (ref 100–199)
HDL: 49 mg/dL (ref >39)
LDL CALC: 77 mg/dL (ref 0–99)
TRIGLYCERIDES: 71 mg/dL (ref 0–149)
VLDL Cholesterol Cal: 14 mg/dL (ref 5–40)

## 2017-05-05 LAB — TSH: TSH: <0.006 u[IU]/mL — ABNORMAL LOW (ref 0.450–4.500)

## 2017-05-05 NOTE — Telephone Encounter (Signed)
Free T4 added on

## 2017-05-05 NOTE — Telephone Encounter (Signed)
Please call and let patient know her cholesterol is fine, her diabetes control is a bit high at 7.2% and goal is under 7 where she was previously--to work on diet and physical activity to get back in line.  Please add Free T4 to her blood work and confirm that she never obtained a biopsy of the thyroid nodule from May.   If she has not, we need to set her up with interventional radiology--[facility] be great if [facility].

## 2017-05-10 ENCOUNTER — Telehealth: Payer: Self-pay | Admitting: Internal Medicine

## 2017-05-10 ENCOUNTER — Other Ambulatory Visit: Payer: Self-pay

## 2017-05-10 DIAGNOSIS — E119 Type 2 diabetes mellitus without complications: Secondary | ICD-10-CM

## 2017-05-10 MED ORDER — METFORMIN HCL 500 MG PO TABS: 500.0000 mg | ORAL_TABLET | Freq: Two times a day (BID) | ORAL | 11 refills | Status: DC

## 2017-05-10 MED ORDER — GABAPENTIN 300 MG PO CAPS: 300.0000 mg | ORAL_CAPSULE | Freq: Two times a day (BID) | ORAL | 11 refills | Status: DC

## 2017-05-10 NOTE — Telephone Encounter (Signed)
Patient called requesting for all of her medications to be sent to [facility] on [address]. Please notified patient once is done.

## 2017-05-11 NOTE — Telephone Encounter (Signed)
Interventional radiology will con tact patient in regards to setting up appointment. They will review US first.

## 2017-05-11 NOTE — Telephone Encounter (Signed)
Rx sent to [facility]. Message left for patient

## 2017-05-27 LAB — SPECIMEN STATUS REPORT

## 2017-05-27 LAB — T4, FREE: Free T4: 2.17 ng/dL — ABNORMAL HIGH (ref 0.82–1.77)

## 2017-06-23 ENCOUNTER — Other Ambulatory Visit: Payer: Self-pay | Admitting: Internal Medicine

## 2017-06-23 ENCOUNTER — Encounter: Payer: Self-pay | Admitting: Internal Medicine

## 2017-06-23 DIAGNOSIS — E041 Nontoxic single thyroid nodule: Secondary | ICD-10-CM

## 2017-06-23 DIAGNOSIS — E0590 Thyrotoxicosis, unspecified without thyrotoxic crisis or storm: Secondary | ICD-10-CM

## 2017-06-23 NOTE — Progress Notes (Signed)
Needs Radionuclide scan to determine if this is an autonomous nodule. Have been unable to reach endocrine to discuss, but will go ahead with this first. I will call patient to explain further

## 2017-06-28 ENCOUNTER — Encounter: Payer: Self-pay | Admitting: Internal Medicine

## 2017-06-28 ENCOUNTER — Ambulatory Visit: Payer: BLUE CROSS/BLUE SHIELD | Admitting: Internal Medicine

## 2017-06-28 VITALS — BP 112/72 | HR 72 | Temp 98.4°F | Resp 12 | Ht 63.0 in | Wt 185.0 lb

## 2017-06-28 DIAGNOSIS — K529 Noninfective gastroenteritis and colitis, unspecified: Secondary | ICD-10-CM | POA: Diagnosis not present

## 2017-06-28 DIAGNOSIS — E041 Nontoxic single thyroid nodule: Secondary | ICD-10-CM | POA: Diagnosis not present

## 2017-06-28 DIAGNOSIS — Z79899 Other long term (current) drug therapy: Secondary | ICD-10-CM

## 2017-06-28 MED ORDER — ONDANSETRON HCL 4 MG PO TABS: 4.0000 mg | ORAL_TABLET | Freq: Three times a day (TID) | ORAL | 0 refills | Status: DC | PRN

## 2017-06-28 NOTE — Progress Notes (Signed)
   Subjective:    Patient ID: Izzabelle Bouley, female    DOB: 12/09/1980, 36 y.o.   MRN: [id number]  HPI   1.  2 nights ago, felt dizzy with chills.  Yesterday, developed generalized abdominal discomfort with diarrhea and nausea, but no vomiting.  Diarrhea is yellow and without blood or melena.   Felt she had a fever this morning, did not measure without thermometer, and took Aleve, 450 mg.  Feeling better with regards to fever since.   Youngest child, a little over 32 yo was seen in ED last week with diarrhea and fever.  Lasted a couple of days for her daughter.   Sugars running in the 150 range.   Today begins her last month of Isoniazid. No yellowness of eyes or gums or skin noted. She is not taking Isoniazid.  2.  Nodular thyroid with mildly hyperthyroid serology:  Discussed needed to get the thyroid scan/uptake and likely to consider surgery one way or another with concerning nodule  Current Meds  Medication Sig  . blood glucose meter kit and supplies KIT Check sugars twice daily before meals.  Marland Kitchen etonogestrel (NEXPLANON) 68 MG IMPL implant 1 each by Subdermal route once.  . gabapentin (NEURONTIN) 300 MG capsule Take 1 capsule (300 mg total) by mouth 2 (two) times daily.  Marland Kitchen glucose blood test strip Use as instructed  . isoniazid (NYDRAZID) 300 MG tablet Take 300 mg by mouth daily.  . metFORMIN (GLUCOPHAGE) 500 MG tablet Take 1 tablet (500 mg total) by mouth 2 (two) times daily with a meal.    Allergies  Allergen Reactions  . Naproxen Nausea And Vomiting      Review of Systems     Objective:   Physical Exam NAD HEENT: PERRL, EOMI, TMs pearly gray, throat without injection, MMM Neck:  S, NT, No HSM or mass, + BS Chest: CTA CV:  RRR without murmur or rub, radial and DP pulses normal and equal Abd:   S, NT, No HSM or mass, + BS Skin:  No rash.       Assessment & Plan:h  1.  Viral Gastroenteritis:  Clear liquids and advance as tolerated. Zofran 4 mg by mouth every 8  hours as needed. Avoid NSAIDS for fever or aches--to utilize Tylenol for now. To call if unable to keep liquids down or sugars uncontrolled.  2.  Nodular goiter with one nodule meeting criteria for FNA and hyperthyroid chemistries:  Sending for thyroid uptake scan.  Recheck TSH, Free T4.  Likely will need to go to surgery whether a cold or hot nodule.

## 2017-06-29 LAB — COMPREHENSIVE METABOLIC PANEL
ALT: 17 IU/L (ref 0–32)
AST: 23 IU/L (ref 0–40)
Albumin/Globulin Ratio: 1.2 (ref 1.2–2.2)
Albumin: 3.9 g/dL (ref 3.5–5.5)
Alkaline Phosphatase: 78 IU/L (ref 39–117)
BUN/Creatinine Ratio: 16 (ref 9–23)
BUN: 5 mg/dL — ABNORMAL LOW (ref 6–20)
Bilirubin Total: 0.2 mg/dL (ref 0.0–1.2)
CALCIUM: 9.1 mg/dL (ref 8.7–10.2)
CO2: 23 mmol/L (ref 20–29)
CREATININE: 0.32 mg/dL — AB (ref 0.57–1.00)
Chloride: 105 mmol/L (ref 96–106)
GFR, EST AFRICAN AMERICAN: 164 mL/min/{1.73_m2} (ref >59)
GFR, EST NON AFRICAN AMERICAN: 143 mL/min/{1.73_m2} (ref >59)
GLOBULIN, TOTAL: 3.2 g/dL (ref 1.5–4.5)
Glucose: 103 mg/dL — ABNORMAL HIGH (ref 65–99)
Potassium: 3.7 mmol/L (ref 3.5–5.2)
SODIUM: 140 mmol/L (ref 134–144)
TOTAL PROTEIN: 7.1 g/dL (ref 6.0–8.5)

## 2017-06-29 LAB — T4, FREE: FREE T4: 2.01 ng/dL — AB (ref 0.82–1.77)

## 2017-06-29 LAB — TSH: TSH: <0.006 u[IU]/mL — ABNORMAL LOW (ref 0.450–4.500)

## 2018-03-08 ENCOUNTER — Encounter: Payer: Self-pay | Admitting: Internal Medicine

## 2018-03-08 ENCOUNTER — Ambulatory Visit: Payer: BLUE CROSS/BLUE SHIELD | Admitting: Internal Medicine

## 2018-03-08 VITALS — BP 110/82 | HR 76 | Resp 12 | Ht 63.0 in | Wt 198.0 lb

## 2018-03-08 DIAGNOSIS — E041 Nontoxic single thyroid nodule: Secondary | ICD-10-CM

## 2018-03-08 DIAGNOSIS — E0590 Thyrotoxicosis, unspecified without thyrotoxic crisis or storm: Secondary | ICD-10-CM

## 2018-03-08 DIAGNOSIS — Z0000 Encounter for general adult medical examination without abnormal findings: Secondary | ICD-10-CM

## 2018-03-08 NOTE — Progress Notes (Signed)
Subjective:    Patient ID: [name], female    DOB: 1980/12/11, 37 y.o.   MRN: [id number]  HPI   CPE with pap  1.  Pap:  Last pap was 07/2016 and normal.  Always normal.  No family of cervical cancer.    2.  Mammogram:  None yet.  No family history of breast cancer.  3.  Osteoprevention:  Drinks once weekly.  She would be willing to drink almond milk 3 times daily.  Has a gym at [employer]  Works out during her break.  30 minutes daily on treadmill  4.  Guaiac Cards:  Never.  No family history of colon cancer  5.  Colonoscopy:  Never.    6.  Immunizations:    Lipid Panel     Component Value Date/Time   CHOL 140 05/04/2017 0854   TRIG 71 05/04/2017 0854   HDL 49 05/04/2017 0854   LDLCALC 77 05/04/2017 0854    7.  Glucose/Cholesterol:  Last A1C up a bit in October 2018 at 7.2%   Lipid Panel     Component Value Date/Time   CHOL 140 05/04/2017 0854   TRIG 71 05/04/2017 0854   HDL 49 05/04/2017 0854   LDLCALC 77 05/04/2017 0854   Concerns:  Mildly hyperthyroid TSH/FT4 testing last year with large complicated thyroid isthmus nodule.  Was to undergo thyroid uptake scan before sending to surgery, but patient cancelled as she wanted to continue to breast feed her daughter and never went.  Was to go to surgery subsequently.  Never occurred.  Current Meds  Medication Sig  . blood glucose meter kit and supplies KIT Check sugars twice daily before meals.  . ferrous sulfate 325 (65 FE) MG tablet Take 325 mg by mouth daily with breakfast. Reported on 11/04/2015  . glucose blood test strip Use as instructed  . Prenatal Vit-Fe Fumarate-FA (PRENATAL VITAMIN PLUS LOW IRON) 27-1 MG TABS 1 daily  . [DISCONTINUED] gabapentin (NEURONTIN) 300 MG capsule Take 1 capsule (300 mg total) by mouth 2 (two) times daily.  . [DISCONTINUED] ibuprofen (ADVIL,MOTRIN) 600 MG tablet 1 tab by mouth every 6 hours as needed for pain.  Take with food.  . [DISCONTINUED] metFORMIN (GLUCOPHAGE) 500 MG  tablet Take 1 tablet (500 mg total) by mouth 2 (two) times daily with a meal.    No Active Allergies   Past Medical History:  Diagnosis Date  . Biological false positive RPR test 08/23/2016   FTA-ABS negative  . Diabetes mellitus without complication ([place]) 1829  . Dyslipidemia 04/27/2017  . Gestational diabetes   . Positive PPD 2016   + PPD :  history of BCG as well.  Was unable to treat for latent when pregnant.  Sent for CXR 12.2017, which was negative for active TB.  To go to PHD for treatment of latent.  07/2016 documented  . Thyroid nodule 02/05/2017  . Visual acuity reduced 2008   Wears glasses only for nighttime driving    Past Surgical History:  Procedure Laterality Date  . CESAREAN SECTION N/A 03/04/2015   Procedure: CESAREAN SECTION;  Surgeon: Frederico Hamman, MD;  Location: [facility];  Service: Obstetrics;  Laterality: N/A;    Family History  Problem Relation Age of Onset  . Stroke Paternal Grandmother     Social History   Socioeconomic History  . Marital status: Soil scientist    Spouse name: Boubacar-1st husband die  . Number of children: 5  . Years of education: 6  .  Highest education level: Not on file  Occupational History  . Occupation: Optometrist at [employer]: No income other than her late husband's social security.  Social Needs  . Financial resource strain: Not on file  . Food insecurity:    Worry: Not on file    Inability: Not on file  . Transportation needs:    Medical: Not on file    Non-medical: Not on file  Tobacco Use  . Smoking status: Never Smoker  . Smokeless tobacco: Never Used  Substance and Sexual Activity  . Alcohol use: No    Alcohol/week: 0.0 standard drinks  . Drug use: No  . Sexual activity: Yes    Birth control/protection: Condom  Lifestyle  . Physical activity:    Days per week: Not on file    Minutes per session: Not on file  . Stress: Not on file  Relationships  . Social connections:      Talks on phone: Not on file    Gets together: Not on file    Attends religious service: Not on file    Active member of club or organization: Not on file    Attends meetings of clubs or organizations: Not on file    Relationship status: Not on file  . Intimate partner violence:    Fear of current or ex partner: Not on file    Emotionally abused: Not on file    Physically abused: Not on file    Forced sexual activity: Not on file  Other Topics Concern  . Not on file  Social History Narrative   Widowed in 2004 when husband killed in [place].   Originally from [place]   Came to [place]. In 2001     Her first 2 children are with her first husband, who came with her to [place]. And was killed in MVA here.   Middle child with a second man she married and divorced after 5 years.  Younger two children with her current boyfriend.   Her oldest daughter is 49 and just married (10/2015) in [place]--grew up in maternal grandmother's home (has not been with this daughter since her daughter was 38 yo.   He does not live with her and the children.     He helps out intermittently.   She is concerned about her current status--She lives on social security from her husband's death--for her oldest son.     Also, applied for food stamps.    Review of Systems  Constitutional: Negative for appetite change, fatigue, fever and unexpected weight change.  HENT: Negative for dental problem, ear pain, hearing loss, postnasal drip and sore throat.   Eyes: Negative for visual disturbance.  Respiratory: Negative for cough and shortness of breath.   Cardiovascular: Negative for chest pain, palpitations and leg swelling.  Gastrointestinal: Negative for abdominal pain, blood in stool (No melena.), constipation and diarrhea.  Genitourinary: Negative for dysuria, frequency, menstrual problem and vaginal discharge.  Musculoskeletal: Negative for arthralgias.  Skin: Negative for rash.  Neurological: Negative for weakness and  numbness.  Psychiatric/Behavioral: Negative for dysphoric mood. The patient is not nervous/anxious.        Objective:   Physical Exam  Constitutional: She is oriented to person, place, and time. She appears well-developed and well-nourished.  HENT:  Head: Normocephalic.  Right Ear: Hearing, tympanic membrane, external ear and ear canal normal.  Left Ear: Hearing, tympanic membrane, external ear and ear canal normal.  Nose: Nose normal.  Mouth/Throat: Oropharynx is clear and moist and mucous membranes are normal.  Eyes: Pupils are equal, round, and reactive to light. Conjunctivae and EOM are normal.  Discs sharp bilaterally  Neck: Normal range of motion and full passive range of motion without pain. Neck supple. Thyromegaly (Very nodular) present.  Cardiovascular: Normal rate, regular rhythm, S1 normal, S2 normal and normal pulses. Exam reveals no S3 and no S4.  Murmur heard.  Systolic murmur is present with a grade of 3/6. Pulses:      Dorsalis pedis pulses are 2+ on the right side, and 2+ on the left side.       Posterior tibial pulses are 2+ on the right side, and 2+ on the left side.  SEM heard best in LUSB, radiating to right 2nd interspace and mildly to carotids. No carotid bruits.  Carotid, radial, femoral, DP and PT pulses normal and equal.   Pulmonary/Chest: Effort normal and breath sounds normal. Right breast exhibits no inverted nipple, no mass, no nipple discharge, no skin change and no tenderness. Left breast exhibits no inverted nipple, no mass, no nipple discharge, no skin change and no tenderness.  Abdominal: Soft. Bowel sounds are normal. She exhibits no mass. There is no hepatosplenomegaly. There is no tenderness. No hernia.  Genitourinary:  Genitourinary Comments: Normal external genitalia.  No discharge.  No uterine or adnexal mass or tenderness. Rectal:  No mass, heme negative light brown stool.  Musculoskeletal: Normal range of motion.       Right foot: There is  normal range of motion and no deformity.       Left foot: There is normal range of motion and no deformity.  Feet:  Right Foot:  Protective Sensation: 10 sites tested. 10 sites sensed.  Skin Integrity: Negative for ulcer or blister.  Left Foot:  Protective Sensation: 10 sites tested. 10 sites sensed.  Skin Integrity: Negative for ulcer or blister.  Lymphadenopathy:       Head (right side): No submental and no submandibular adenopathy present.       Head (left side): No submental and no submandibular adenopathy present.    She has no cervical adenopathy.    She has no axillary adenopathy.       Right: No inguinal and no supraclavicular adenopathy present.       Left: No inguinal and no supraclavicular adenopathy present.  Neurological: She is alert and oriented to person, place, and time. She has normal strength and normal reflexes. No cranial nerve deficit or sensory deficit. Coordination and gait normal.  Skin: Skin is warm and dry. Capillary refill takes less than 2 seconds. No rash noted.  Psychiatric: She has a normal mood and affect. Her speech is normal and behavior is normal. Judgment and thought content normal. Cognition and memory are normal.          Assessment & Plan:  1.  CPE without pap Recommended calling regarding influenza vaccine in 2 weeks.  2.  Thyroid nodule with mildly hyperactive thyroid serology.  Sending again for NM thyroid scan to see if nodule is causing hormone level abnormalities and then will need FNA of the nodule/nodules or possible surgical evaluation.

## 2018-03-08 NOTE — Patient Instructions (Signed)
Drink 3 eight ounce cups of almond milk daily--unsweetened.

## 2018-03-17 ENCOUNTER — Other Ambulatory Visit (INDEPENDENT_AMBULATORY_CARE_PROVIDER_SITE_OTHER): Payer: BLUE CROSS/BLUE SHIELD

## 2018-03-17 DIAGNOSIS — E119 Type 2 diabetes mellitus without complications: Secondary | ICD-10-CM

## 2018-03-17 DIAGNOSIS — E785 Hyperlipidemia, unspecified: Secondary | ICD-10-CM

## 2018-03-17 DIAGNOSIS — Z79899 Other long term (current) drug therapy: Secondary | ICD-10-CM

## 2018-03-17 DIAGNOSIS — E0590 Thyrotoxicosis, unspecified without thyrotoxic crisis or storm: Secondary | ICD-10-CM

## 2018-03-18 LAB — COMPREHENSIVE METABOLIC PANEL
A/G RATIO: 1.4 (ref 1.2–2.2)
ALK PHOS: 55 IU/L (ref 39–117)
ALT: 11 IU/L (ref 0–32)
AST: 14 IU/L (ref 0–40)
Albumin: 4 g/dL (ref 3.5–5.5)
BILIRUBIN TOTAL: 0.3 mg/dL (ref 0.0–1.2)
BUN/Creatinine Ratio: 16 (ref 9–23)
BUN: 7 mg/dL (ref 6–20)
CALCIUM: 9.4 mg/dL (ref 8.7–10.2)
CO2: 22 mmol/L (ref 20–29)
Chloride: 104 mmol/L (ref 96–106)
Creatinine, Ser: 0.43 mg/dL — ABNORMAL LOW (ref 0.57–1.00)
GFR calc Af Amer: 148 mL/min/{1.73_m2} (ref >59)
GFR, EST NON AFRICAN AMERICAN: 129 mL/min/{1.73_m2} (ref >59)
GLOBULIN, TOTAL: 2.9 g/dL (ref 1.5–4.5)
Glucose: 102 mg/dL — ABNORMAL HIGH (ref 65–99)
POTASSIUM: 3.9 mmol/L (ref 3.5–5.2)
SODIUM: 140 mmol/L (ref 134–144)
Total Protein: 6.9 g/dL (ref 6.0–8.5)

## 2018-03-18 LAB — HGB A1C W/O EAG: HEMOGLOBIN A1C: 7 % — AB (ref 4.8–5.6)

## 2018-03-18 LAB — CBC WITH DIFFERENTIAL/PLATELET
BASOS: 1 %
Basophils Absolute: 0 10*3/uL (ref 0.0–0.2)
EOS (ABSOLUTE): 0.1 10*3/uL (ref 0.0–0.4)
EOS: 3 %
HEMATOCRIT: 37.5 % (ref 34.0–46.6)
Hemoglobin: 11.7 g/dL (ref 11.1–15.9)
Immature Grans (Abs): 0 10*3/uL (ref 0.0–0.1)
Immature Granulocytes: 0 %
LYMPHS ABS: 1.6 10*3/uL (ref 0.7–3.1)
Lymphs: 40 %
MCH: 26.4 pg — AB (ref 26.6–33.0)
MCHC: 31.2 g/dL — AB (ref 31.5–35.7)
MCV: 85 fL (ref 79–97)
MONOS ABS: 0.4 10*3/uL (ref 0.1–0.9)
Monocytes: 9 %
NEUTROS ABS: 1.9 10*3/uL (ref 1.4–7.0)
Neutrophils: 47 %
Platelets: 260 10*3/uL (ref 150–450)
RBC: 4.43 x10E6/uL (ref 3.77–5.28)
RDW: 12.2 % — AB (ref 12.3–15.4)
WBC: 4.1 10*3/uL (ref 3.4–10.8)

## 2018-03-18 LAB — MICROALBUMIN / CREATININE URINE RATIO
CREATININE, UR: 78.8 mg/dL
Microalb/Creat Ratio: 155.2 mg/g creat — ABNORMAL HIGH (ref 0.0–30.0)
Microalbumin, Urine: 122.3 ug/mL

## 2018-03-18 LAB — LIPID PANEL W/O CHOL/HDL RATIO
Cholesterol, Total: 141 mg/dL (ref 100–199)
HDL: 47 mg/dL (ref >39)
LDL Calculated: 83 mg/dL (ref 0–99)
Triglycerides: 55 mg/dL (ref 0–149)
VLDL Cholesterol Cal: 11 mg/dL (ref 5–40)

## 2018-03-18 LAB — T4, FREE: FREE T4: 2.03 ng/dL — AB (ref 0.82–1.77)

## 2018-03-18 LAB — TSH: TSH: <0.006 u[IU]/mL — ABNORMAL LOW (ref 0.450–4.500)

## 2018-04-13 ENCOUNTER — Ambulatory Visit: Payer: Self-pay | Admitting: Internal Medicine

## 2018-04-13 ENCOUNTER — Encounter: Payer: Self-pay | Admitting: Internal Medicine

## 2018-04-13 ENCOUNTER — Ambulatory Visit: Payer: BLUE CROSS/BLUE SHIELD | Admitting: Internal Medicine

## 2018-04-13 VITALS — BP 122/80 | HR 78 | Temp 98.1°F | Resp 12 | Ht 63.0 in | Wt 197.0 lb

## 2018-04-13 DIAGNOSIS — G4452 New daily persistent headache (NDPH): Secondary | ICD-10-CM | POA: Diagnosis not present

## 2018-04-13 DIAGNOSIS — G4700 Insomnia, unspecified: Secondary | ICD-10-CM

## 2018-04-13 DIAGNOSIS — E119 Type 2 diabetes mellitus without complications: Secondary | ICD-10-CM

## 2018-04-13 DIAGNOSIS — E041 Nontoxic single thyroid nodule: Secondary | ICD-10-CM

## 2018-04-13 LAB — GLUCOSE, POCT (MANUAL RESULT ENTRY): POC GLUCOSE: 153 mg/dL — AB (ref 70–99)

## 2018-04-13 MED ORDER — KETOROLAC TROMETHAMINE 60 MG/2ML IM SOLN
60.0000 mg | Freq: Once | INTRAMUSCULAR | Status: AC
  Administered 2018-04-13: 60 mg via INTRAMUSCULAR

## 2018-04-13 MED ORDER — CLONAZEPAM 1 MG PO TABS: ORAL_TABLET | ORAL | 0 refills | Status: DC

## 2018-04-13 MED ORDER — KETOROLAC TROMETHAMINE 10 MG PO TABS: 10.0000 mg | ORAL_TABLET | Freq: Four times a day (QID) | ORAL | 0 refills | Status: DC | PRN

## 2018-04-13 NOTE — Patient Instructions (Signed)
Go to ED if your headache worsens over night.

## 2018-04-13 NOTE — Progress Notes (Signed)
   Subjective:    Patient ID: [name], female    DOB: [birth year], 37 y.o.   MRN: [id number]  HPI   Here with headache for 2 weeks.  Frontal area of head bilaterally radiating back to ear level.   Describes headache as constant and pounding in nature. Can wake her from sleep.   No aura preceding.   Does describe photophobia, phonophobia.   No nausea or vomiting.   Has tried 500 mg Tylenol without improvement Aleve 450 mg with no improvement at all. Last week, had episode of feeling numbness all over with shaking all over.    Sugars highest has been 148 and lowest in 130s.   Has not been skipping meals.   Denies URI symptoms, including nasal or sinus congestion, no sore throat or cough.  No GI symptoms--no diarrhea.  No rashes.  No fevers.  Denies increased stress for any reason, but preceding the headache, has had difficulties with insomnia.  Cannot get a history to suggest why.   Considering having another child.  She and her partner are using condoms religiously however currently Discussed she needs to let me know when they decide to proceed with that so we can go over her meds that may have a risk to a fetus.  She acknowledges understanding  Current Meds  Medication Sig  . blood glucose meter kit and supplies KIT Check sugars twice daily before meals.  . ferrous sulfate 325 (65 FE) MG tablet Take 325 mg by mouth daily with breakfast. Reported on 11/04/2015  . gabapentin (NEURONTIN) 300 MG capsule Take 1 capsule (300 mg total) by mouth 2 (two) times daily.  Marland Kitchen glucose blood test strip Use as instructed  . metFORMIN (GLUCOPHAGE) 500 MG tablet Take 1 tablet (500 mg total) by mouth 2 (two) times daily with a meal.  . Prenatal Vit-Fe Fumarate-FA (PRENATAL VITAMIN PLUS LOW IRON) 27-1 MG TABS 1 daily    Allergies  Allergen Reactions  . Naproxen Nausea And Vomiting  Not really with a side effect as above as takes Aleve regularly without a problem   Review of Systems       Objective:   Physical Exam Appears tired HEENT: PERRL, EOMI, Discs sharp, TMs pearly gray, throat without injection or exudate.  NT over frontal and maxillary areas.  NO obvious photophobia with ophthalmoscopic exam. Neck:  Supple, NT over posterior neck musculature to nuchal ridge. Chest:  CTA CV:  RRR with systolic murmur unchanged.   Radial and DP pulses normal and equal Abd:  S, NT, No HSM or mass.   Neuro:  A & O x 3, CN  2-12 grossly intact, DTRs 2+/4, Motor 5/5, sensory grossly normal.  Gait normal.       Assessment & Plan:  1.  Headache:  Not clear what type of headache this is.  Neuro exam and supple neck today while having headache.  Suspect tension type, though patient denies increased stress.   Will need to address insomnia as well as likely a factor CBC, CMP Toradaol 60 mg IM  Headache down to a 3 or 4 where before an 8.  About 20 minutes after Toradol given Toradol 10 mg every 6-8 hours with food as needed next 2 days.  2.  Insomnia:  5 tabs of Clonazepam 1 mg Rx to use 1/2 to 1 tab at bedtime next couple of nights to see if sleep improves headache as well.

## 2018-04-14 LAB — CBC WITH DIFFERENTIAL/PLATELET
BASOS ABS: 0 10*3/uL (ref 0.0–0.2)
Basos: 0 %
EOS (ABSOLUTE): 0.1 10*3/uL (ref 0.0–0.4)
Eos: 3 %
Hematocrit: 37.2 % (ref 34.0–46.6)
Hemoglobin: 12.4 g/dL (ref 11.1–15.9)
Immature Grans (Abs): 0 10*3/uL (ref 0.0–0.1)
Immature Granulocytes: 1 %
LYMPHS ABS: 2 10*3/uL (ref 0.7–3.1)
Lymphs: 38 %
MCH: 27.5 pg (ref 26.6–33.0)
MCHC: 33.3 g/dL (ref 31.5–35.7)
MCV: 83 fL (ref 79–97)
MONOS ABS: 0.4 10*3/uL (ref 0.1–0.9)
Monocytes: 7 %
Neutrophils Absolute: 2.8 10*3/uL (ref 1.4–7.0)
Neutrophils: 51 %
Platelets: 289 10*3/uL (ref 150–450)
RBC: 4.51 x10E6/uL (ref 3.77–5.28)
RDW: 12.1 % — AB (ref 12.3–15.4)
WBC: 5.4 10*3/uL (ref 3.4–10.8)

## 2018-04-14 LAB — COMPREHENSIVE METABOLIC PANEL
ALK PHOS: 59 IU/L (ref 39–117)
ALT: 13 IU/L (ref 0–32)
AST: 12 IU/L (ref 0–40)
Albumin/Globulin Ratio: 1.3 (ref 1.2–2.2)
Albumin: 4.1 g/dL (ref 3.5–5.5)
BILIRUBIN TOTAL: 0.2 mg/dL (ref 0.0–1.2)
BUN / CREAT RATIO: 16 (ref 9–23)
BUN: 7 mg/dL (ref 6–20)
CHLORIDE: 101 mmol/L (ref 96–106)
CO2: 22 mmol/L (ref 20–29)
CREATININE: 0.43 mg/dL — AB (ref 0.57–1.00)
Calcium: 9.7 mg/dL (ref 8.7–10.2)
GFR calc Af Amer: 148 mL/min/{1.73_m2} (ref >59)
GFR calc non Af Amer: 129 mL/min/{1.73_m2} (ref >59)
GLUCOSE: 142 mg/dL — AB (ref 65–99)
Globulin, Total: 3.2 g/dL (ref 1.5–4.5)
Potassium: 4.5 mmol/L (ref 3.5–5.2)
Sodium: 139 mmol/L (ref 134–144)
Total Protein: 7.3 g/dL (ref 6.0–8.5)

## 2018-04-25 ENCOUNTER — Encounter (HOSPITAL_COMMUNITY)
Admission: RE | Admit: 2018-04-25 | Discharge: 2018-04-25 | Disposition: A | Discharge status: Home / Self Care | Payer: BLUE CROSS/BLUE SHIELD | Source: Ambulatory Visit | Attending: Internal Medicine | Admitting: Internal Medicine

## 2018-04-25 DIAGNOSIS — E041 Nontoxic single thyroid nodule: Secondary | ICD-10-CM | POA: Insufficient documentation

## 2018-04-25 DIAGNOSIS — E0590 Thyrotoxicosis, unspecified without thyrotoxic crisis or storm: Secondary | ICD-10-CM | POA: Diagnosis present

## 2018-04-25 MED ORDER — SODIUM IODIDE I-123 7.4 MBQ CAPS
400.0000 | ORAL_CAPSULE | Freq: Once | ORAL | Status: AC
  Administered 2018-04-25: 400 via ORAL

## 2018-04-26 ENCOUNTER — Encounter (HOSPITAL_COMMUNITY)
Admission: RE | Admit: 2018-04-26 | Discharge: 2018-04-26 | Disposition: A | Discharge status: Home / Self Care | Payer: BLUE CROSS/BLUE SHIELD | Source: Ambulatory Visit | Attending: Internal Medicine | Admitting: Internal Medicine

## 2018-04-26 DIAGNOSIS — E041 Nontoxic single thyroid nodule: Secondary | ICD-10-CM | POA: Insufficient documentation

## 2018-04-26 DIAGNOSIS — E0590 Thyrotoxicosis, unspecified without thyrotoxic crisis or storm: Secondary | ICD-10-CM | POA: Insufficient documentation

## 2018-05-04 ENCOUNTER — Other Ambulatory Visit: Payer: Self-pay | Admitting: Internal Medicine

## 2018-05-04 DIAGNOSIS — E041 Nontoxic single thyroid nodule: Secondary | ICD-10-CM

## 2018-05-04 DIAGNOSIS — E0590 Thyrotoxicosis, unspecified without thyrotoxic crisis or storm: Secondary | ICD-10-CM

## 2018-05-04 NOTE — Progress Notes (Signed)
Needs FNA of complex thyroid nodule in isthmus.   Have ordered also TSA  Referring for above and to endocrine

## 2018-05-09 NOTE — Addendum Note (Signed)
Addended by: Marcene Duos on: 05/09/2018 03:01 PM   Modules accepted: Orders

## 2018-05-09 NOTE — Addendum Note (Signed)
Addended by: Marcelino Freestone on: 05/09/2018 12:47 PM   Modules accepted: Orders

## 2018-05-18 ENCOUNTER — Other Ambulatory Visit (INDEPENDENT_AMBULATORY_CARE_PROVIDER_SITE_OTHER): Payer: BLUE CROSS/BLUE SHIELD

## 2018-05-18 ENCOUNTER — Other Ambulatory Visit: Payer: Self-pay | Admitting: Internal Medicine

## 2018-05-18 DIAGNOSIS — E119 Type 2 diabetes mellitus without complications: Secondary | ICD-10-CM

## 2018-05-18 DIAGNOSIS — E0590 Thyrotoxicosis, unspecified without thyrotoxic crisis or storm: Secondary | ICD-10-CM | POA: Diagnosis not present

## 2018-05-18 DIAGNOSIS — E041 Nontoxic single thyroid nodule: Secondary | ICD-10-CM

## 2018-05-18 MED ORDER — METFORMIN HCL 500 MG PO TABS: 500.0000 mg | ORAL_TABLET | Freq: Two times a day (BID) | ORAL | 11 refills | Status: DC

## 2018-05-18 MED ORDER — GABAPENTIN 300 MG PO CAPS: 300.0000 mg | ORAL_CAPSULE | Freq: Two times a day (BID) | ORAL | 11 refills | Status: DC

## 2018-05-19 ENCOUNTER — Ambulatory Visit
Admission: RE | Admit: 2018-05-19 | Discharge: 2018-05-19 | Disposition: A | Discharge status: Home / Self Care | Payer: BLUE CROSS/BLUE SHIELD | Source: Ambulatory Visit | Attending: Internal Medicine | Admitting: Internal Medicine

## 2018-05-19 DIAGNOSIS — E041 Nontoxic single thyroid nodule: Secondary | ICD-10-CM

## 2018-05-19 LAB — THYROTROPIN RECEPTOR AUTOABS: Thyrotropin Receptor Ab: 6.02 IU/L — ABNORMAL HIGH (ref 0.00–1.75)

## 2018-05-25 ENCOUNTER — Other Ambulatory Visit: Payer: Self-pay | Admitting: Internal Medicine

## 2018-05-25 DIAGNOSIS — E0590 Thyrotoxicosis, unspecified without thyrotoxic crisis or storm: Secondary | ICD-10-CM

## 2018-05-25 DIAGNOSIS — E041 Nontoxic single thyroid nodule: Secondary | ICD-10-CM

## 2018-06-20 ENCOUNTER — Ambulatory Visit (INDEPENDENT_AMBULATORY_CARE_PROVIDER_SITE_OTHER): Payer: BLUE CROSS/BLUE SHIELD | Admitting: Internal Medicine

## 2018-06-20 ENCOUNTER — Encounter: Payer: Self-pay | Admitting: Internal Medicine

## 2018-06-20 VITALS — BP 122/80 | HR 74 | Temp 98.1°F | Resp 12 | Ht 63.0 in | Wt 198.0 lb

## 2018-06-20 DIAGNOSIS — J0140 Acute pansinusitis, unspecified: Secondary | ICD-10-CM

## 2018-06-20 MED ORDER — AZITHROMYCIN 500 MG PO TABS: ORAL_TABLET | ORAL | 0 refills | Status: DC

## 2018-06-20 NOTE — Progress Notes (Signed)
   Subjective:    Patient ID: [name], female    DOB: 11/15/1980, 37 y.o.   MRN: [id number]  HPI   For about 1 week, has had a frontal headache involving her eyes and maxillary area as well.  No real congestion, but with posterior pharyngeal.  Throat is irritated.  Cough is not productive.  When she coughs, she has sternal pain.   No nasal discharge. No shortness of breath. No fevers or chills.  Her ears hurt as well.  No nausea, vomiting, stomachache, diarrhea.   Drinking fine, but not really hungry as she has no taste. Feels dizzy at times when moves head. No body aches She works at [employer] and was sent home from work 4 days ago and has not been able to get back. Taking Tylenol Cold and Flu.  Helps for a bit, but symptoms not improving with time.  States she is checking her sugars and generally running 150s and below.  She has at times been up to high 100s, however.    Current Meds  Medication Sig  . blood glucose meter kit and supplies KIT Check sugars twice daily before meals.  . clonazePAM (KLONOPIN) 1 MG tablet 1/2 to 1 tab by mouth at bedtime for sleep  . ferrous sulfate 325 (65 FE) MG tablet Take 325 mg by mouth daily with breakfast. Reported on 11/04/2015  . gabapentin (NEURONTIN) 300 MG capsule Take 1 capsule (300 mg total) by mouth 2 (two) times daily.  Marland Kitchen glucose blood test strip Use as instructed  . ketorolac (TORADOL) 10 MG tablet Take 1 tablet (10 mg total) by mouth every 6 (six) hours as needed.  . metFORMIN (GLUCOPHAGE) 500 MG tablet Take 1 tablet (500 mg total) by mouth 2 (two) times daily with a meal.  . Prenatal Vit-Fe Fumarate-FA (PRENATAL VITAMIN PLUS LOW IRON) 27-1 MG TABS 1 daily    No Known Allergies    Review of Systems     Objective:   Physical Exam NAD HEENT:  PERRL EOMI TMs pearly gray, posterior pharynx with moderate injection but no exudates. Nasal mucosa quite swollen and erythematous.   No increased tenderness with pressure over  frontal and maxillary sinus areas Neck:  Supple, No adenopathy Chest:  CTA CV:  RRR with systolic murmur unchanged Abd:  S NT, No HSM or mass, + BS Skin:  Dry hands bilaterally with scratched and scabbed lesions on dorsal hands.  No interdigital lesions.  No other lesions on body as well.       Assessment & Plan:  Acute Sinusitis:  Azithromycin 500 mg daily for 7 days. To continue OTC cold remedy. To consider ibuprofen for headache if Tylenol not helping. Push fluids. Off of work until Thursday.

## 2018-06-20 NOTE — Patient Instructions (Signed)
Okay to continue with Tylenol cold and sinus for cold symptoms

## 2018-06-22 ENCOUNTER — Ambulatory Visit
Admission: RE | Admit: 2018-06-22 | Discharge: 2018-06-22 | Disposition: A | Discharge status: Home / Self Care | Payer: BLUE CROSS/BLUE SHIELD | Source: Ambulatory Visit | Attending: Internal Medicine | Admitting: Internal Medicine

## 2018-06-22 ENCOUNTER — Other Ambulatory Visit (HOSPITAL_COMMUNITY)
Admission: RE | Admit: 2018-06-22 | Discharge: 2018-06-22 | Disposition: A | Discharge status: Home / Self Care | Payer: BLUE CROSS/BLUE SHIELD | Source: Ambulatory Visit | Attending: Interventional Radiology | Admitting: Interventional Radiology

## 2018-06-22 DIAGNOSIS — E0590 Thyrotoxicosis, unspecified without thyrotoxic crisis or storm: Secondary | ICD-10-CM

## 2018-06-22 DIAGNOSIS — E041 Nontoxic single thyroid nodule: Secondary | ICD-10-CM

## 2018-06-22 HISTORY — PX: OTHER SURGICAL HISTORY: SHX169

## 2018-07-12 ENCOUNTER — Encounter (HOSPITAL_COMMUNITY): Payer: Self-pay

## 2018-09-06 ENCOUNTER — Ambulatory Visit: Payer: BLUE CROSS/BLUE SHIELD | Admitting: Internal Medicine

## 2018-09-06 ENCOUNTER — Encounter: Payer: Self-pay | Admitting: Internal Medicine

## 2018-09-06 VITALS — BP 122/82 | HR 70 | Resp 12 | Ht 63.0 in | Wt 197.0 lb

## 2018-09-06 DIAGNOSIS — E119 Type 2 diabetes mellitus without complications: Secondary | ICD-10-CM

## 2018-09-06 DIAGNOSIS — E785 Hyperlipidemia, unspecified: Secondary | ICD-10-CM

## 2018-09-06 MED ORDER — TRIAMCINOLONE ACETONIDE 0.1 % EX CREA: 1.0000 "application " | TOPICAL_CREAM | Freq: Two times a day (BID) | CUTANEOUS | 1 refills | Status: DC

## 2018-09-06 MED ORDER — COMPLETENATE 29-1 MG PO CHEW: 1.0000 | CHEWABLE_TABLET | Freq: Every day | ORAL | 11 refills | Status: DC

## 2018-09-06 NOTE — Progress Notes (Signed)
Subjective:    Patient ID: [name], female   DOB: 1980/09/05, 38 y.o.   MRN: [id number]   HPI   1.  Thyroid nodule with FNA with atypia of unknown significance and hyperthyroidism (Grave's Disease).  Was seen by Dr. Louanna Raw Endocrine.  Apparently deciding whether to undergo thyroidectomy vs medication  (methimazole or RAI treatment per patient.) She elected to take Methimazole. She has been taking Methimazole 10 mg once daily since the 20th  Tolerating okay.   Reportedly, nodule FNA results felt to be benign by Dr. Buddy Duty per patient.  Evaluation of this was also a concern with what should be next steps.   She finally admits to palpitations.   She did not really not heat intolerance.   No diarrhea.   No tremor She is at times sexually active, though not currently.  She has not had intercourse since starting Methimazole.  Discussed she does not want to get pregnant while taking due to increased risk of birth defects.    2.  DM:  Describes sugars running just above 150 range in the morning and 170 range in the evening.   Had influenza vaccine in November. Up to date on Pneumococcal 23 She Had last eye check December 2019.  Dr. Katy Fitch.  No diabetic changes. Recent liver enzymes fine.  3.  Relatively elevated  LDL:  Bumped up to 80s in October.  She feels she has been eating in a healthy manner and is physically active.    4.  Itchy area on left dorsal hand.  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  5.  Headache:  No further problems with this since treated for sleep and with Toradol.   Current Meds  Medication Sig  . blood glucose meter kit and supplies KIT Check sugars twice daily before meals.  . clonazePAM (KLONOPIN) 1 MG tablet 1/2 to 1 tab by mouth at bedtime for sleep  . gabapentin (NEURONTIN) 300 MG capsule Take 1 capsule (300 mg total) by mouth 2 (two) times daily.  Marland Kitchen glucose blood test strip Use as instructed  . ketorolac  (TORADOL) 10 MG tablet Take 1 tablet (10 mg total) by mouth every 6 (six) hours as needed.  . metFORMIN (GLUCOPHAGE) 500 MG tablet Take 1 tablet (500 mg total) by mouth 2 (two) times daily with a meal.  . methimazole (TAPAZOLE) 10 MG tablet TAKE 1 TABLET BY MOUTH ONCE DAILY WITH FOOD FOR 30 DAYS   No Known Allergies   Review of Systems    Objective:   BP 122/82 (BP Location: Left Arm, Patient Position: Sitting, Cuff Size: Large)   Pulse 70   Resp 12   Ht _0  (1.6 m)   Wt 197 lb (89.4 kg)   LMP 08/30/2018   BMI 34.90 kg/m   Physical Exam  NAD HEENT:  PERRL, EOMI, no lid lag or exophthalmos.   Neck:  Supple, with nodular enlarged thyroid.  + overlying bruit, though has systolic heart murmur as well that is unchanged and may be radiating. Chest:  CTA CV:  RRR with Grade II/VII SEM, LSB radiating to right second interspace and carotids--unchanged.  Radial and DP pulses normal and equal Abd:  S, NT, No HSM or mass.  Obese. LE: No edema. Skin with scars and scratched areas on dorsal radial hands and forearms.  No interdigital lesions.  No lesions at beltline or elsewhere   Assessment & Plan   1.  Hyperthyroidism/Grave's  Disease:  Methimazole.  FNA appears to be felt by Dr. Buddy Duty to be benign. Discussed at length using condoms with intercourse with increased risk of fetal anomalies with Methimazole.  Also fetal hypothyroidism is a risk.  She agrees to follow through with this.   2.  DM: A1C  3.  Elevated LDL:  FLP  4.  Obesity:  To work on diet and physical activity  5.  Hand rash:  Likely due to cleaning chemicals:  Triamcinolone cream 0.1% with hydrating cream twice daily.  Follow up in 6 months for CPE without Pap

## 2018-09-06 NOTE — Patient Instructions (Signed)
Drink a glass of water before every meal Drink 6-8 glasses of water daily Eat three meals daily Eat a protein and healthy fat with every meal (eggs,fish, chicken, [place] and limit red meats) Eat 5 servings of vegetables daily, mix the colors Eat 2 servings of fruit daily with skin, if skin is edible Use smaller plates Put food/utensils down as you chew and swallow each bite Eat at a table with friends/family at least once daily, no TV Do not eat in front of the TV  Recent studies show that people who consume all of their calories in a 12 hour period lose weight more efficiently.  For example, if you eat your first meal at 7:00 a.m., your last meal of the day should be completed by 7:00 p.m.  Apply good hydrating cream over whole of hands twice daily after Triamcinlone cream to rash on back of hands and forearms

## 2018-09-07 LAB — LIPID PANEL W/O CHOL/HDL RATIO
Cholesterol, Total: 168 mg/dL (ref 100–199)
HDL: 47 mg/dL (ref >39)
LDL CALC: 101 mg/dL — AB (ref 0–99)
Triglycerides: 102 mg/dL (ref 0–149)
VLDL CHOLESTEROL CAL: 20 mg/dL (ref 5–40)

## 2018-09-07 LAB — HGB A1C W/O EAG: HEMOGLOBIN A1C: 6.8 % — AB (ref 4.8–5.6)

## 2018-11-14 NOTE — Telephone Encounter (Signed)
See phone note

## 2019-03-07 ENCOUNTER — Encounter: Payer: Self-pay | Admitting: Internal Medicine

## 2019-03-07 ENCOUNTER — Ambulatory Visit (INDEPENDENT_AMBULATORY_CARE_PROVIDER_SITE_OTHER): Payer: BLUE CROSS/BLUE SHIELD | Admitting: Internal Medicine

## 2019-03-07 ENCOUNTER — Other Ambulatory Visit: Payer: Self-pay

## 2019-03-07 VITALS — BP 130/78 | HR 74 | Resp 12 | Ht 63.0 in | Wt 192.0 lb

## 2019-03-07 DIAGNOSIS — Z0000 Encounter for general adult medical examination without abnormal findings: Secondary | ICD-10-CM

## 2019-03-07 DIAGNOSIS — Z1239 Encounter for other screening for malignant neoplasm of breast: Secondary | ICD-10-CM | POA: Diagnosis not present

## 2019-03-07 DIAGNOSIS — N920 Excessive and frequent menstruation with regular cycle: Secondary | ICD-10-CM

## 2019-03-07 LAB — POCT URINE PREGNANCY: Preg Test, Ur: NEGATIVE

## 2019-03-07 MED ORDER — NORETHINDRONE 0.35 MG PO TABS: 1.0000 | ORAL_TABLET | Freq: Every day | ORAL | 11 refills | Status: DC

## 2019-03-07 NOTE — Progress Notes (Signed)
Subjective:    Patient ID: [name], female   DOB: 03/22/1981, 38 y.o.   MRN: [id number]   HPI   CPE without pap  1.  Pap:  Last pap 07/2016 and normal.    2.  Mammogram:  Never.  No family history of breast cancer.    3.  Osteoprevention:  Would be willing to try 3 cups of unsweetened almond milk daily.  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  She also goes to the gym 3 times weekly for 30 minutes.  4.  Guaiac Cards:  Never.    5.  Colonoscopy:  Never.  No family history of colon cancer.  6.  Immunizations:  Immunization History  Administered Date(s) Administered  . Influenza Inj Mdck Quad Pf 04/27/2017  . Influenza-Unspecified 07/03/2015, 06/07/2018  . Pneumococcal Polysaccharide-23 04/06/2016  . Tdap 03/06/2015     7.  Glucose/Cholesterol:  Has DM. Last cholesterol panel in February not at goal with LDL, but with rest. Lipid Panel     Component Value Date/Time   CHOL 168 09/06/2018 1014   TRIG 102 09/06/2018 1014   HDL 47 09/06/2018 1014   LDLCALC 101 (H) 09/06/2018 1014   8.  Hyperthyroidism:  Being followed by endocrine.  On Methimazole.  Recent labs with endocrine.  Current Meds  Medication Sig  . blood glucose meter kit and supplies KIT Check sugars twice daily before meals.  Marland Kitchen glucose blood test strip Use as instructed  . methimazole (TAPAZOLE) 10 MG tablet TAKE 1 TABLET BY MOUTH ONCE DAILY WITH FOOD FOR 30 DAYS  . [DISCONTINUED] ferrous sulfate 325 (65 FE) MG tablet Take 325 mg by mouth daily with breakfast. Reported on 11/04/2015  . [DISCONTINUED] gabapentin (NEURONTIN) 300 MG capsule Take 1 capsule (300 mg total) by mouth 2 (two) times daily.  . [DISCONTINUED] metFORMIN (GLUCOPHAGE) 500 MG tablet Take 1 tablet (500 mg total) by mouth 2 (two) times daily with a meal.  . [DISCONTINUED] triamcinolone cream (KENALOG) 0.1 % Apply 1 application topically 2 (two) times daily.   No Known Allergies   Past Medical  History:  Diagnosis Date  . Biological false positive RPR test 08/23/2016   FTA-ABS negative  . Diabetes mellitus without complication ([facility]) 2482  . Dyslipidemia 04/27/2017  . Gestational diabetes   . Hyperthyroidism   . Positive PPD 2016   + PPD :  history of BCG as well.  Was unable to treat for latent when pregnant.  Sent for CXR 12.2017, which was negative for active TB.  To go to PHD for treatment of latent.  07/2016 documented  . Thyroid nodule 02/05/2017   Thyroid nodule with FNA with atypia of unknown significance and hyperthyroidism (Grave's Disease).  Was seen by Dr. Louanna Raw Endocrine.  Apparently deciding whether to undergo thyroidectomy vs medication  (methimazole or RAI treatment per patient.) She elected to take Methimazole.  . Visual acuity reduced 2008   Wears glasses only for nighttime driving    . Past Surgical History:  Procedure Laterality Date  . CESAREAN SECTION N/A 03/04/2015   Procedure: CESAREAN SECTION;  Surgeon: Frederico Hamman, MD;  Location: [facility];  Service: Obstetrics;  Laterality: N/A;  . FNA thyroid nodule  06/22/2018   path equivocal--cells of unknown signficance.  Followed by Endocrine/[facility] and being treated with Methimazole for hyperthryroidism.    Family History  Problem Relation Age of Onset  . Stroke Paternal Grandmother     Social  History   Socioeconomic History  . Marital status: Married    Spouse name: Boubacar-1st husband die  . Number of children: 5  . Years of education: 6  . Highest education level: Not on file  Occupational History  . Occupation: [employer]  Social Needs  . Financial resource strain: Not hard at all  . Food insecurity    Worry: Never true    Inability: Never true  . Transportation needs    Medical: No    Non-medical: No  Tobacco Use  . Smoking status: Never Smoker  . Smokeless tobacco: Never Used  Substance and Sexual Activity  . Alcohol use: No    Alcohol/week: 0.0 standard drinks   . Drug use: No  . Sexual activity: Yes    Partners: Male    Birth control/protection: None  Lifestyle  . Physical activity    Days per week: 7 days    Minutes per session: 30 min  . Stress: Not on file  Relationships  . Social Herbalist on phone: Not on file    Gets together: Not on file    Attends religious service: Not on file    Active member of club or organization: Not on file    Attends meetings of clubs or organizations: Not on file    Relationship status: Married  . Intimate partner violence    Fear of current or ex partner: No    Emotionally abused: No    Physically abused: No    Forced sexual activity: No  Other Topics Concern  . Not on file  Social History Narrative   Widowed in 2004 when husband killed in [place].   Originally from [place]   Came to [place]. In 2001     Her first 2 children are with her first husband, who came with her to [place]. And was killed in MVA here.   Middle child with a second man she married and divorced after 5 years.     Younger two children with her current husband   Her oldest daughter is 89 and just married (10/2015) in [place]--grew up in maternal grandmother's home (has not been with this daughter since her daughter was 35 yo.   Lives with third husband and 4 of her children.          Review of Systems  Constitutional: Negative for activity change, fatigue, fever and unexpected weight change.  HENT: Negative for dental problem, ear pain, hearing loss, rhinorrhea and sore throat.   Eyes: Negative for visual disturbance (Last eye check 06/2018 and no diabetic changes.).  Respiratory: Negative for cough and shortness of breath.   Cardiovascular: Negative for chest pain, palpitations and leg swelling.  Gastrointestinal: Negative for abdominal pain, blood in stool (No melena), constipation and diarrhea.  Genitourinary: Positive for menstrual problem (Periods regular, but lasting 10-14 days.  This started after had Nexplanon removed.  ).  Negative for dysuria, frequency and vaginal discharge.  Musculoskeletal: Negative for arthralgias.  Skin: Negative for rash.  Neurological: Negative for weakness and numbness.  Psychiatric/Behavioral: Negative for dysphoric mood. The patient is not nervous/anxious.       Objective:   BP 130/78 (BP Location: Left Arm, Patient Position: Sitting, Cuff Size: Large)   Pulse 74   Resp 12   Ht 5' 3"  (1.6 m)   Wt 192 lb (87.1 kg)   LMP 02/28/2019   BMI 34.01 kg/m   Physical Exam  Constitutional: She is oriented to  person, place, and time. She appears well-developed and well-nourished.  HENT:  Head: Normocephalic and atraumatic.  Right Ear: Hearing, tympanic membrane, external ear and ear canal normal.  Left Ear: Hearing, tympanic membrane, external ear and ear canal normal.  Nose: Nose normal.  Mouth/Throat: Uvula is midline, oropharynx is clear and moist and mucous membranes are normal.  Eyes: Pupils are equal, round, and reactive to light. Conjunctivae and EOM are normal.  No exophthalmos or lid lag. Discs sharp bilaterally.  Neck: Normal range of motion and full passive range of motion without pain. Neck supple. Thyromegaly (Large nodular goiter.) present.  Cardiovascular: Normal rate, regular rhythm, S1 normal and S2 normal. Exam reveals no S3, no S4 and no friction rub.  No murmur heard. No carotid bruits.  Carotid, radial, femoral, DP and PT pulses normal and equal.   Pulmonary/Chest: Effort normal and breath sounds normal. Right breast exhibits no inverted nipple, no mass, no nipple discharge, no skin change and no tenderness. Left breast exhibits no inverted nipple, no mass, no nipple discharge, no skin change and no tenderness.  Abdominal: Soft. Bowel sounds are normal. She exhibits no mass. There is no hepatosplenomegaly. There is no abdominal tenderness.  Genitourinary:    Genitourinary Comments: Normal external female genitalia No uterine or adnexal mass or tenderness.  Rectal:  No mass, heme negative stool.   Musculoskeletal: Normal range of motion.  Lymphadenopathy:       Head (right side): No submental and no submandibular adenopathy present.       Head (left side): No submental and no submandibular adenopathy present.    She has no cervical adenopathy.    She has no axillary adenopathy.       Right: No inguinal and no supraclavicular adenopathy present.       Left: No inguinal and no supraclavicular adenopathy present.  Neurological: She is alert and oriented to person, place, and time. She has normal strength and normal reflexes. No cranial nerve deficit or sensory deficit. Coordination and gait normal.  Skin: Skin is warm. No rash noted.  Psychiatric: She has a normal mood and affect. Her speech is normal and behavior is normal. Judgment and thought content normal. Cognition and memory are normal.     Assessment & Plan   1.  CPE without Pap Mammogram baseline Return guaiac cards with fasting labs in 2 weeks. Call for influenza clinic in Sept/Oct. Or obtain at [facility].  2.  Menorrhagia:  Start Norethindrone 0.35 mg daily.  3.  DM/hypertension:  Labs in 2 weeks.  4.  Elevated LDL:  Labs in 2 weeks.  Follow up with me in 3 months.

## 2019-03-28 ENCOUNTER — Other Ambulatory Visit: Payer: Self-pay

## 2019-03-28 ENCOUNTER — Other Ambulatory Visit (INDEPENDENT_AMBULATORY_CARE_PROVIDER_SITE_OTHER): Payer: BC Managed Care – PPO

## 2019-03-28 DIAGNOSIS — E119 Type 2 diabetes mellitus without complications: Secondary | ICD-10-CM | POA: Diagnosis not present

## 2019-03-28 DIAGNOSIS — Z1211 Encounter for screening for malignant neoplasm of colon: Secondary | ICD-10-CM

## 2019-03-28 DIAGNOSIS — E785 Hyperlipidemia, unspecified: Secondary | ICD-10-CM

## 2019-03-28 DIAGNOSIS — R195 Other fecal abnormalities: Secondary | ICD-10-CM

## 2019-03-28 DIAGNOSIS — Z79899 Other long term (current) drug therapy: Secondary | ICD-10-CM

## 2019-03-28 LAB — POC HEMOCCULT BLD/STL (HOME/3-CARD/SCREEN): Fecal Occult Blood, POC: POSITIVE — AB

## 2019-03-28 NOTE — Addendum Note (Signed)
Addended by: Serafina Mitchell on: 03/28/2019 12:20 PM   Modules accepted: Orders

## 2019-03-29 LAB — LIPID PANEL W/O CHOL/HDL RATIO
Cholesterol, Total: 163 mg/dL (ref 100–199)
HDL: 48 mg/dL (ref >39)
LDL Chol Calc (NIH): 106 mg/dL — ABNORMAL HIGH (ref 0–99)
Triglycerides: 44 mg/dL (ref 0–149)
VLDL Cholesterol Cal: 9 mg/dL (ref 5–40)

## 2019-03-29 LAB — CBC WITH DIFFERENTIAL/PLATELET
Basophils Absolute: 0 10*3/uL (ref 0.0–0.2)
Basos: 1 %
EOS (ABSOLUTE): 0.1 10*3/uL (ref 0.0–0.4)
Eos: 2 %
Hematocrit: 34.6 % (ref 34.0–46.6)
Hemoglobin: 11.1 g/dL (ref 11.1–15.9)
Immature Grans (Abs): 0 10*3/uL (ref 0.0–0.1)
Immature Granulocytes: 0 %
Lymphocytes Absolute: 1.7 10*3/uL (ref 0.7–3.1)
Lymphs: 40 %
MCH: 28.2 pg (ref 26.6–33.0)
MCHC: 32.1 g/dL (ref 31.5–35.7)
MCV: 88 fL (ref 79–97)
Monocytes Absolute: 0.4 10*3/uL (ref 0.1–0.9)
Monocytes: 10 %
Neutrophils Absolute: 2.1 10*3/uL (ref 1.4–7.0)
Neutrophils: 47 %
Platelets: 252 10*3/uL (ref 150–450)
RBC: 3.93 x10E6/uL (ref 3.77–5.28)
RDW: 12.8 % (ref 11.7–15.4)
WBC: 4.4 10*3/uL (ref 3.4–10.8)

## 2019-03-29 LAB — MICROALBUMIN / CREATININE URINE RATIO
Creatinine, Urine: 120.6 mg/dL
Microalb/Creat Ratio: 36 mg/g creat — ABNORMAL HIGH (ref 0–29)
Microalbumin, Urine: 44 ug/mL

## 2019-03-29 LAB — COMPREHENSIVE METABOLIC PANEL
ALT: 13 IU/L (ref 0–32)
AST: 12 IU/L (ref 0–40)
Albumin/Globulin Ratio: 1.6 (ref 1.2–2.2)
Albumin: 4.1 g/dL (ref 3.8–4.8)
Alkaline Phosphatase: 46 IU/L (ref 39–117)
BUN/Creatinine Ratio: 7 — ABNORMAL LOW (ref 9–23)
BUN: 4 mg/dL — ABNORMAL LOW (ref 6–24)
Bilirubin Total: 0.3 mg/dL (ref 0.0–1.2)
CO2: 23 mmol/L (ref 20–29)
Calcium: 8.9 mg/dL (ref 8.7–10.2)
Chloride: 109 mmol/L — ABNORMAL HIGH (ref 96–106)
Creatinine, Ser: 0.55 mg/dL — ABNORMAL LOW (ref 0.57–1.00)
GFR calc Af Amer: 136 mL/min/{1.73_m2} (ref >59)
GFR calc non Af Amer: 118 mL/min/{1.73_m2} (ref >59)
Globulin, Total: 2.5 g/dL (ref 1.5–4.5)
Glucose: 104 mg/dL — ABNORMAL HIGH (ref 65–99)
Potassium: 4.2 mmol/L (ref 3.5–5.2)
Sodium: 143 mmol/L (ref 134–144)
Total Protein: 6.6 g/dL (ref 6.0–8.5)

## 2019-03-29 LAB — HGB A1C W/O EAG: Hgb A1c MFr Bld: 6.2 % — ABNORMAL HIGH (ref 4.8–5.6)

## 2019-04-03 NOTE — Addendum Note (Signed)
Addended by: Marcelino Duster on: 04/03/2019 12:49 PM   Modules accepted: Orders

## 2019-04-04 ENCOUNTER — Encounter: Payer: Self-pay | Admitting: Gastroenterology

## 2019-04-19 ENCOUNTER — Ambulatory Visit
Admission: RE | Admit: 2019-04-19 | Discharge: 2019-04-19 | Disposition: A | Discharge status: Home / Self Care | Payer: BC Managed Care – PPO | Source: Ambulatory Visit | Attending: Internal Medicine | Admitting: Internal Medicine

## 2019-04-19 ENCOUNTER — Other Ambulatory Visit: Payer: Self-pay

## 2019-04-19 DIAGNOSIS — Z1239 Encounter for other screening for malignant neoplasm of breast: Secondary | ICD-10-CM

## 2019-04-21 ENCOUNTER — Telehealth: Payer: Self-pay | Admitting: Internal Medicine

## 2019-04-21 ENCOUNTER — Other Ambulatory Visit: Payer: Self-pay

## 2019-04-21 DIAGNOSIS — E119 Type 2 diabetes mellitus without complications: Secondary | ICD-10-CM

## 2019-04-21 MED ORDER — METFORMIN HCL 500 MG PO TABS: 500.0000 mg | ORAL_TABLET | Freq: Two times a day (BID) | ORAL | 11 refills | Status: DC

## 2019-04-21 MED ORDER — GABAPENTIN 300 MG PO CAPS: 300.0000 mg | ORAL_CAPSULE | Freq: Two times a day (BID) | ORAL | 11 refills | Status: DC

## 2019-04-21 NOTE — Telephone Encounter (Signed)
Rx's sent to [facility]

## 2019-04-21 NOTE — Telephone Encounter (Signed)
Patient called requesting Rx on Gapatentin 300 mg and Metformin 500 Mg to be sent in at [facility].  Please advise.

## 2019-05-09 ENCOUNTER — Encounter: Payer: Self-pay | Admitting: Gastroenterology

## 2019-05-09 ENCOUNTER — Other Ambulatory Visit: Payer: Self-pay

## 2019-05-09 ENCOUNTER — Ambulatory Visit: Payer: BC Managed Care – PPO | Admitting: Gastroenterology

## 2019-05-09 VITALS — BP 120/72 | HR 77 | Temp 97.8°F | Ht 64.0 in | Wt 197.0 lb

## 2019-05-09 DIAGNOSIS — Z1159 Encounter for screening for other viral diseases: Secondary | ICD-10-CM

## 2019-05-09 DIAGNOSIS — R195 Other fecal abnormalities: Secondary | ICD-10-CM | POA: Diagnosis not present

## 2019-05-09 MED ORDER — SUPREP BOWEL PREP KIT 17.5-3.13-1.6 GM/177ML PO SOLN: 1.0000 | ORAL | 0 refills | Status: DC

## 2019-05-09 NOTE — Patient Instructions (Signed)
You have been scheduled for a colonoscopy. Please follow written instructions given to you at your visit today.  Please pick up your prep supplies at the pharmacy within the next 1-3 days. If you use inhalers (even only as needed), please bring them with you on the day of your procedure.  If you are age 40 or younger, your body mass index should be between 19-25. Your Body mass index is 33.81 kg/m. If this is out of the aformentioned range listed, please consider follow up with your Primary Care Provider.   Thank you for choosing me and [facility].  Dr.Danis

## 2019-05-09 NOTE — Progress Notes (Signed)
[facility] Consult Note:  History: [name] 05/09/2019  Referring provider: Mack Hook, MD  Reason for consult/chief complaint: positive stool test (pt hasn't seen any blood and just reports abd pain after her period)   Subjective  HPI: Positive stool card 03/28/2019 (August office visit note without mention of GI symptoms) She recalls collecting the stool specimen during her menses.  Has some lower abdominal/pelvic cramps around menses, otherwise no chronic abdominal pain.  Denies altered bowel habits or rectal bleeding.  Denies dysphagia, nausea, vomiting, early satiety or weight loss.    ROS:  Review of Systems  Constitutional: Negative for appetite change and unexpected weight change.  HENT: Negative for mouth sores and voice change.   Eyes: Negative for pain and redness.  Respiratory: Negative for cough and shortness of breath.   Cardiovascular: Negative for chest pain and palpitations.  Genitourinary: Negative for dysuria and hematuria.  Musculoskeletal: Negative for arthralgias and myalgias.  Skin: Negative for pallor and rash.  Neurological: Negative for weakness and headaches.  Hematological: Negative for adenopathy.     Past Medical History: Past Medical History:  Diagnosis Date  . Biological false positive RPR test 08/23/2016   FTA-ABS negative  . Diabetes mellitus without complication ([facility]) 9211  . Dyslipidemia 04/27/2017  . Gestational diabetes   . Positive PPD 2016   + PPD :  history of BCG as well.  Was unable to treat for latent when pregnant.  Sent for CXR 12.2017, which was negative for active TB.  To go to PHD for treatment of latent.  07/2016 documented  . Thyroid nodule 02/05/2017  . Visual acuity reduced 2008   Wears glasses only for nighttime driving     Past Surgical History: Past Surgical History:  Procedure Laterality Date  . CESAREAN SECTION N/A 03/04/2015   Procedure: CESAREAN SECTION;  Surgeon:  Frederico Hamman, MD;  Location: [facility];  Service: Obstetrics;  Laterality: N/A;     Family History: Family History  Problem Relation Age of Onset  . Stroke Paternal Grandmother     Social History: Social History   Socioeconomic History  . Marital status: Married    Spouse name: Boubacar-1st husband die  . Number of children: 5  . Years of education: 6  . Highest education level: Not on file  Occupational History  . Occupation: [employer]  Social Needs  . Financial resource strain: Not hard at all  . Food insecurity    Worry: Never true    Inability: Never true  . Transportation needs    Medical: No    Non-medical: No  Tobacco Use  . Smoking status: Never Smoker  . Smokeless tobacco: Never Used  Substance and Sexual Activity  . Alcohol use: No    Alcohol/week: 0.0 standard drinks  . Drug use: No  . Sexual activity: Yes    Partners: Male    Birth control/protection: None  Lifestyle  . Physical activity    Days per week: 7 days    Minutes per session: 30 min  . Stress: Not on file  Relationships  . Social Herbalist on phone: Not on file    Gets together: Not on file    Attends religious service: Not on file    Active member of club or organization: Not on file    Attends meetings of clubs or organizations: Not on file    Relationship status: Married  Other Topics Concern  . Not on file  Social History Narrative   Widowed in 2004 when husband killed in [place].   Originally from [place]   Came to [place]. In 2001     Her first 2 children are with her first husband, who came with her to [place]. And was killed in MVA here.   Middle child with a second man she married and divorced after 5 years.     Younger two children with her current husband   Her oldest daughter is 64 and just married (10/2015) in [place]--grew up in maternal grandmother's home (has not been with this daughter since her daughter was 13 yo.   Lives with third husband and 4 of her  children.        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 blood glucose meter kit and supplies KIT Check sugars twice daily before meals. 1 each 0  . Cholecalciferol (VITAMIN D3 PO) Take one pill daily, 1000 IU    . gabapentin (NEURONTIN) 300 MG capsule Take 1 capsule (300 mg total) by mouth 2 (two) times daily. 60 capsule 11  . glucose blood test strip Use as instructed 100 each 12  . metFORMIN (GLUCOPHAGE) 500 MG tablet Take 1 tablet (500 mg total) by mouth 2 (two) times daily with a meal. 60 tablet 11  . methimazole (TAPAZOLE) 10 MG tablet TAKE 1 TABLET BY MOUTH ONCE DAILY WITH FOOD FOR 30 DAYS    . Na Sulfate-K Sulfate-Mg Sulf (SUPREP BOWEL PREP KIT) 17.5-3.13-1.6 GM/177ML SOLN Take 1 kit by mouth as directed. For colonoscopy prep 354 mL 0   No current facility-administered medications for this visit.       ___________________________________________________________________ Objective   Exam:  BP 120/72   Pulse 77   Temp 97.8 F (36.6 C)   Ht 5' 4"  (1.626 m)   Wt 197 lb (89.4 kg)   BMI 33.81 kg/m    General: Well-appearing  Eyes: sclera anicteric, no redness  ENT: oral mucosa moist without lesions, no cervical or supraclavicular lymphadenopathy  CV: RRR without murmur, S1/S2, no JVD, no peripheral edema  Resp: clear to auscultation bilaterally, normal RR and effort noted  GI: soft, no tenderness, with active bowel sounds. No guarding or palpable organomegaly noted.  Skin; warm and dry, no rash or jaundice noted  Neuro: awake, alert and oriented x 3. Normal gross motor function and fluent speech  Labs:  CBC Latest Ref Rng & Units 03/28/2019 04/13/2018 03/17/2018  WBC 3.4 - 10.8 x10E3/uL 4.4 5.4 4.1  Hemoglobin 11.1 - 15.9 g/dL 11.1 12.4 11.7  Hematocrit 34.0 - 46.6 % 34.6 37.2 37.5  Platelets 150 - 450 x10E3/uL 252 289 260  MCV and RDW normal  CMP  Latest Ref Rng & Units 03/28/2019 04/13/2018 03/17/2018  Glucose 65 - 99 mg/dL 104(H) 142(H) 102(H)  BUN 6 - 24 mg/dL 4(L) 7 7  Creatinine 0.57 - 1.00 mg/dL 0.55(L) 0.43(L) 0.43(L)  Sodium 134 - 144 mmol/L 143 139 140  Potassium 3.5 - 5.2 mmol/L 4.2 4.5 3.9  Chloride 96 - 106 mmol/L 109(H) 101 104  CO2 20 - 29 mmol/L 23 22 22   Calcium 8.7 - 10.2 mg/dL 8.9 9.7 9.4  Total Protein 6.0 - 8.5 g/dL 6.6 7.3 6.9  Total Bilirubin 0.0 - 1.2 mg/dL 0.3 0.2 0.3  Alkaline Phos 39 - 117 IU/L 46 59 55  AST 0 - 40 IU/L 12 12 14   ALT 0 -  32 IU/L 13 13 11    Hemoglobin A1c= 6.2   Assessment: Encounter Diagnosis  Name Primary?  . Heme positive stool Yes  This apparent screening test was performed younger than typical screening age.  However, positive result must be assumed to be real. No localizing GI symptoms.   Plan:  Colonoscopy.  She is agreeable after discussion of procedure and risks.  Thank you for the courtesy of this consult.  Please call me with any questions or concerns.  Nelida Meuse III  CC: Referring provider noted above

## 2019-05-15 ENCOUNTER — Encounter: Payer: Self-pay | Admitting: Gastroenterology

## 2019-05-15 LAB — SARS CORONAVIRUS 2 (TAT 6-24 HRS): SARS Coronavirus 2: NEGATIVE

## 2019-05-18 ENCOUNTER — Ambulatory Visit (AMBULATORY_SURGERY_CENTER): Payer: BC Managed Care – PPO | Admitting: Gastroenterology

## 2019-05-18 ENCOUNTER — Encounter: Payer: Self-pay | Admitting: Gastroenterology

## 2019-05-18 ENCOUNTER — Other Ambulatory Visit: Payer: Self-pay

## 2019-05-18 VITALS — BP 143/96 | HR 64 | Temp 98.4°F | Resp 10 | Ht 64.0 in | Wt 197.0 lb

## 2019-05-18 DIAGNOSIS — Z538 Procedure and treatment not carried out for other reasons: Secondary | ICD-10-CM

## 2019-05-18 DIAGNOSIS — R195 Other fecal abnormalities: Secondary | ICD-10-CM

## 2019-05-18 MED ORDER — SODIUM CHLORIDE 0.9 % IV SOLN: 500.0000 mL | INTRAVENOUS | Status: DC

## 2019-05-18 NOTE — Patient Instructions (Signed)
Please call to schedule your colonoscopy at your earliest convenience.  Call [phone] the available date was November 24 th.     YOU HAD AN ENDOSCOPIC PROCEDURE TODAY AT [facility]:   Refer to the procedure report that was given to you for any specific questions about what was found during the examination.  If the procedure report does not answer your questions, please call your gastroenterologist to clarify.  If you requested that your care partner not be given the details of your procedure findings, then the procedure report has been included in a sealed envelope for you to review at your convenience later.  YOU SHOULD EXPECT: Some feelings of bloating in the abdomen. Passage of more gas than usual.  Walking can help get rid of the air that was put into your GI tract during the procedure and reduce the bloating. If you had a lower endoscopy (such as a colonoscopy or flexible sigmoidoscopy) you may notice spotting of blood in your stool or on the toilet paper. If you underwent a bowel prep for your procedure, you may not have a normal bowel movement for a few days.  Please Note:  You might notice some irritation and congestion in your nose or some drainage.  This is from the oxygen used during your procedure.  There is no need for concern and it should clear up in a day or so.  SYMPTOMS TO REPORT IMMEDIATELY:   Following lower endoscopy (colonoscopy or flexible sigmoidoscopy):  Excessive amounts of blood in the stool  Significant tenderness or worsening of abdominal pains  Swelling of the abdomen that is new, acute  Fever of 100F or higher   For urgent or emergent issues, a gastroenterologist can be reached at any hour by calling [phone].   DIET:  We do recommend a small meal at first, but then you may proceed to your regular diet.  Drink plenty of fluids but you should avoid alcoholic beverages for 24 hours.  ACTIVITY:  You should plan to take it easy for  the rest of today and you should NOT DRIVE or use heavy machinery until tomorrow (because of the sedation medicines used during the test).    FOLLOW UP: Our staff will call the number listed on your records 48-72 hours following your procedure to check on you and address any questions or concerns that you may have regarding the information given to you following your procedure. If we do not reach you, we will leave a message.  We will attempt to reach you two times.  During this call, we will ask if you have developed any symptoms of COVID 19. If you develop any symptoms (ie: fever, flu-like symptoms, shortness of breath, cough etc.) before then, please call [phone].  If you test positive for Covid 19 in the 2 weeks post procedure, please call and report this information to [place].    If any biopsies were taken you will be contacted by phone or by letter within the next 1-3 weeks.  Please call us at [phone] if you have not heard about the biopsies in 3 weeks.    SIGNATURES/CONFIDENTIALITY: You and/or your care partner have signed paperwork which will be entered into your electronic medical record.  These signatures attest to the fact that that the information above on your After Visit Summary has been reviewed and is understood.  Full responsibility of the confidentiality of this discharge information lies with you and/or your care-partner.

## 2019-05-18 NOTE — Progress Notes (Signed)
Report given to PACU, vss 

## 2019-05-18 NOTE — Op Note (Signed)
[facility] Patient Name: Alice LofflerHassia Harris Procedure Date: 05/18/2019 2:29 PM MRN: [id number] Endoscopist: Sherilyn CooterHenry L. Myrtie Neitheranis , MD Age: [age] Referring MD:  Date of Birth: 04/30/1981 Gender: Female Account #: [id number] Procedure:                Colonoscopy Indications:              Heme positive stool Medicines:                Monitored Anesthesia Care Procedure:                Pre-Anesthesia Assessment:                           - Prior to the procedure, a History and Physical                            was performed, and patient medications and                            allergies were reviewed. The patient's tolerance of                            previous anesthesia was also reviewed. The risks                            and benefits of the procedure and the sedation                            options and risks were discussed with the patient.                            All questions were answered, and informed consent                            was obtained. Prior Anticoagulants: The patient has                            taken no previous anticoagulant or antiplatelet                            agents. ASA Grade Assessment: II - A patient with                            mild systemic disease. After reviewing the risks                            and benefits, the patient was deemed in                            satisfactory condition to undergo the procedure.                           After obtaining informed consent, the colonoscope  was passed under direct vision. Throughout the                            procedure, the patient's blood pressure, pulse, and                            oxygen saturations were monitored continuously. The                            Colonoscope was introduced through the anus with                            the intention of advancing to the cecum. The scope                            was advanced to the transverse colon  before the                            procedure was aborted. Medications were given. The                            colonoscopy was performed with difficulty due to                            poor bowel prep. The patient tolerated the                            procedure well. The quality of the bowel                            preparation was poor (patient did not follow                            pre-procedure dietary instructions). The transverse                            colon and rectum were photographed. Scope In: 2:31:11 PM Scope Out: 2:39:16 PM Total Procedure Duration: 0 hours 8 minutes 5 seconds  Findings:                 The perianal and digital rectal examinations were                            normal.                           A large amount of stool was found in the entire                            colon (moreso in transverse than                            descending/sigmoid), precluding visualization.  Scope could only be advanced to the transverse                            colon as a result of poor prep. Complications:            No immediate complications. Estimated Blood Loss:     Estimated blood loss: none. Impression:               - Preparation of the colon was poor.                           - Stool in the entire examined colon.                           - No specimens collected. Recommendation:           - Patient has a contact number available for                            emergencies. The signs and symptoms of potential                            delayed complications were discussed with the                            patient. Return to normal activities tomorrow.                            Written discharge instructions were provided to the                            patient.                           - Resume previous diet.                           - Continue present medications.                           - Repeat colonoscopy  at appointment to be                            scheduled. 2 DAY PREP with close attention to                            pre-procedure clear liquid diet instructions. [name] L. Myrtie Neither, MD 05/18/2019 2:49:56 PM This report has been signed electronically.

## 2019-05-19 NOTE — Progress Notes (Signed)
PT needs to schedule her repeat colonoscopy due to poor prep. She wanted to check her schedule before she made the appointment. She needs a previsit and a 2 day prep. SM

## 2019-05-22 ENCOUNTER — Telehealth: Payer: Self-pay

## 2019-05-22 NOTE — Telephone Encounter (Signed)
  Follow up Call-  Call back number 05/18/2019  Post procedure Call Back phone  # [phone]  Permission to leave phone message Yes  Some recent data might be hidden     Patient questions:  Do you have a fever, pain , or abdominal swelling? No. Pain Score  0 *  Have you tolerated food without any problems? Yes.    Have you been able to return to your normal activities? Yes.    Do you have any questions about your discharge instructions: Diet   No. Medications  No. Follow up visit  No.  Do you have questions or concerns about your Care? No.  Actions: * If pain score is 4 or above: No action needed, pain <4.  1. Have you developed a fever since your procedure? no  2.   Have you had an respiratory symptoms (SOB or cough) since your procedure? no  3.   Have you tested positive for COVID 19 since your procedure no  4.   Have you had any family members/close contacts diagnosed with the COVID 19 since your procedure?  no   If yes to any of these questions please route to Joylene John, RN and Alphonsa Gin, Therapist, sports.

## 2019-05-23 ENCOUNTER — Telehealth: Payer: Self-pay

## 2019-05-23 NOTE — Telephone Encounter (Signed)
Patient called stating her son tested positive for covid yesterday but symptoms started on 05/18/2019. States she now has headache, cough, sore throat, abdominal pain, extreme fatigue and fever. States her symptoms started 3 days ago. Patient states she is self-isolating and everyone In her house is quarantine. Her son is self-isolating as well. Information explained to patient on covid and self isolation and quarantining. Informed she may take ibuprofen for fever and pain and to go to ER immediately if she starts to have difficulty breathing or can not breath. A;; precautions for covid have been gone over with the patient. Patient verbalized understanding.

## 2019-05-29 ENCOUNTER — Encounter: Payer: Self-pay | Admitting: Internal Medicine

## 2019-05-31 ENCOUNTER — Ambulatory Visit (INDEPENDENT_AMBULATORY_CARE_PROVIDER_SITE_OTHER): Payer: BC Managed Care – PPO | Admitting: Internal Medicine

## 2019-05-31 DIAGNOSIS — R058 Other specified cough: Secondary | ICD-10-CM

## 2019-05-31 DIAGNOSIS — Z20828 Contact with and (suspected) exposure to other viral communicable diseases: Secondary | ICD-10-CM | POA: Diagnosis not present

## 2019-05-31 DIAGNOSIS — R05 Cough: Secondary | ICD-10-CM

## 2019-05-31 DIAGNOSIS — Z20822 Contact with and (suspected) exposure to covid-19: Secondary | ICD-10-CM

## 2019-06-02 LAB — NOVEL CORONAVIRUS, NAA: SARS-CoV-2, NAA: DETECTED — AB

## 2019-06-04 ENCOUNTER — Encounter: Payer: Self-pay | Admitting: Internal Medicine

## 2019-06-04 NOTE — Progress Notes (Signed)
    Subjective:    Patient ID: [name], female   DOB: [birth year], 38 y.o.   MRN: [id number]   HPI  Patient evaluated outside in car as son tested positive 05/22/2019 for coronavirus. Patient developed symptoms on 05/23/2019 with headache, cough, sore throat, abdominal pain, fatigue and fever.   She and her son have been able to separate and self isolate in their home.   He testing was negative--tested on 11/2 and I thought on the 9th as well, but unable to find that result currently. She does not feel she has the virus if her test was negative, but certainly with the symptoms. She has been taking Ibuprofen for her headache, last dose was last evening, the 17th.  No fever for some time.   She still is with a cough and some fatigue, but symptoms are improving. Asked to see her as over the phone she was complaining of dyspnea, but now states better today. Marland Kitchen  No Known Allergies   Review of Systems    Objective:   LMP 05/03/2019 (Exact Date)   SpO2 98%   Physical Exam NAD Lungs:  CTA CV:  RRR without rub.  Assessment & Plan   COVID19 suspect:  She really wanted to be retested today.  Discussed would assume she has the virus regardless. She will continue to self isolate through end of Thursday.  We will likely get the test back on Friday.   She is willing to have me speak with her place of employment, [employer], which is a retirement facility.   Really want her well before returning to be certain she will not spread. Her family needs to continue to quarantine. She has been assigned to tracer, Babette Relic.

## 2019-06-06 ENCOUNTER — Encounter: Payer: BC Managed Care – PPO | Admitting: Gastroenterology

## 2019-06-07 ENCOUNTER — Encounter: Payer: Self-pay | Admitting: Internal Medicine

## 2019-06-07 ENCOUNTER — Ambulatory Visit: Payer: BC Managed Care – PPO | Admitting: Internal Medicine

## 2019-08-01 DIAGNOSIS — E0500 Thyrotoxicosis with diffuse goiter without thyrotoxic crisis or storm: Secondary | ICD-10-CM | POA: Diagnosis not present

## 2019-08-01 DIAGNOSIS — E0590 Thyrotoxicosis, unspecified without thyrotoxic crisis or storm: Secondary | ICD-10-CM | POA: Diagnosis not present

## 2019-08-18 ENCOUNTER — Ambulatory Visit: Payer: BC Managed Care – PPO | Admitting: Internal Medicine

## 2019-09-12 ENCOUNTER — Ambulatory Visit (INDEPENDENT_AMBULATORY_CARE_PROVIDER_SITE_OTHER): Payer: Self-pay | Admitting: Internal Medicine

## 2019-09-12 ENCOUNTER — Encounter: Payer: Self-pay | Admitting: Internal Medicine

## 2019-09-12 ENCOUNTER — Other Ambulatory Visit: Payer: Self-pay

## 2019-09-12 VITALS — BP 112/82 | HR 78 | Resp 12 | Ht 63.0 in | Wt 192.0 lb

## 2019-09-12 DIAGNOSIS — E041 Nontoxic single thyroid nodule: Secondary | ICD-10-CM

## 2019-09-12 DIAGNOSIS — E0590 Thyrotoxicosis, unspecified without thyrotoxic crisis or storm: Secondary | ICD-10-CM

## 2019-09-12 DIAGNOSIS — E119 Type 2 diabetes mellitus without complications: Secondary | ICD-10-CM

## 2019-09-12 DIAGNOSIS — Z3A08 8 weeks gestation of pregnancy: Secondary | ICD-10-CM

## 2019-09-12 LAB — POCT URINE PREGNANCY: Preg Test, Ur: POSITIVE — AB

## 2019-09-12 MED ORDER — GABAPENTIN 100 MG PO CAPS: ORAL_CAPSULE | ORAL | 0 refills | Status: DC

## 2019-09-12 MED ORDER — PRENATAL VITAMIN PLUS LOW IRON 27-1 MG PO TABS: 1.0000 | ORAL_TABLET | Freq: Every day | ORAL | 11 refills | Status: DC

## 2019-09-12 NOTE — Patient Instructions (Addendum)
Call in brand of glucometer and test strips when you get home.  Call Dr. Daune Perch office and let him know you stopped the Methimazole as you are pregnant.  Let him know I am checking TSH and Free T4 again.    Check sugars before each meal and at bedtime.  Will decide if you need more medication to control your sugar after I get your A1C back.  Drop off sugars in 2 weeks.

## 2019-09-12 NOTE — Progress Notes (Signed)
    Subjective:    Patient ID: [name], female   DOB: Nov 15, 1980, 39 y.o.   MRN: [id number]   HPI   1.  Patient pregnant:  First day of LMP was July 15, 2019, which places her at 8 4/7 weeks.   She tapered her Gabapentin to 300 mg once at bedtime.   Gabapentin was started in 2017 by ortho after a fall and injury to left arm.  She has not had tne pain in some time. She stopped her morning dose of gabapentin beginning of February after missing her first period.  Stopped her methimazole for hyperthyroidism the same time.   States had thyroid testing with Dr. Buddy Duty, endocrine soon after she stopped and everything was fine.   2.  DM:  Feels her sugars are high and alternatively low.   Feels shaky before lunch frequently. Sugars in the morning are 254 to 300 since pregnant. She is not checking her sugar when she is shaky or any other time of day.  Eats oatmeal with 2% evaporated milk at 7 am before work. Eats again at 10:30 a.m.--orange or apple Gets shaky before 12:30 pm.  Eats lunch:  Chicken soup and rice.  Yogurt--Mochi--fat free. Drinks water when gets shaky--does not eat anything or check her sugar.  Dinner:  Spinach salad, avocado, tomato and ranch dressing. Drinks water.  Current Meds  Medication Sig  . blood glucose meter kit and supplies KIT Check sugars twice daily before meals.  . Cholecalciferol (VITAMIN D3 PO) Take one pill daily, 1000 IU  . gabapentin (NEURONTIN) 300 MG capsule Take 1 capsule (300 mg total) by mouth 2 (two) times daily.  Marland Kitchen glucose blood test strip Use as instructed  . metFORMIN (GLUCOPHAGE) 500 MG tablet Take 1 tablet (500 mg total) by mouth 2 (two) times daily with a meal.   No Known Allergies   Review of Systems    Objective:   BP 112/82 (BP Location: Left Arm, Patient Position: Sitting, Cuff Size: Large)   Pulse 78   Resp 12   Ht '5\' 3"'$  (1.6 m)   Wt 192 lb (87.1 kg)   LMP 07/15/2019   BMI 34.01 kg/m   Physical Exam  NAD HEENT:   No lid lag or exophthalmos.   Lungs:  CTA CV:  RRR with SEM Grade II/VI.  Radial and DP pulses normal and equal Abd: S, NT, No HSM or mass, + BS LE:  No edema Neuro:  No tremulousness or hyperrflexia at ankles.   Assessment & Plan  1.  Estimated 8 4/[redacted] week gestation pregnancy.  Off Methimazole.  Will wean off gabapentin completely over next week as seems not necessary at this point.  Urine HCG + confirms pregnancy. PNV Rx written.  2.  DM:  Needs more protein and good fats with each meal.  Also discussed at length snacks to prevent low blood sugars prior to meals if necessary. Asked her to check her sugars when she is feeling shaky and before each meal and at bedtime.  A1C to determine if needs more medication to control BS.   To drop off BS diary in 2 weeks. Need to call in test strips--she needs to call clinic with brand when she gets home today. CBC, CMP  3.  Thyroid nodule/Hyperthyroidism:  Discussed briefly with Dr. Buddy Duty.  Will add order for Thyroid Stimulating antibodies. TSH, Free T4

## 2019-09-13 LAB — TSH: TSH: 0.879 u[IU]/mL (ref 0.450–4.500)

## 2019-09-13 LAB — CBC WITH DIFFERENTIAL/PLATELET
Basophils Absolute: 0 10*3/uL (ref 0.0–0.2)
Basos: 0 %
EOS (ABSOLUTE): 0.1 10*3/uL (ref 0.0–0.4)
Eos: 1 %
Hematocrit: 35.7 % (ref 34.0–46.6)
Hemoglobin: 11.5 g/dL (ref 11.1–15.9)
Immature Grans (Abs): 0 10*3/uL (ref 0.0–0.1)
Immature Granulocytes: 0 %
Lymphocytes Absolute: 1.7 10*3/uL (ref 0.7–3.1)
Lymphs: 28 %
MCH: 27.6 pg (ref 26.6–33.0)
MCHC: 32.2 g/dL (ref 31.5–35.7)
MCV: 86 fL (ref 79–97)
Monocytes Absolute: 0.6 10*3/uL (ref 0.1–0.9)
Monocytes: 10 %
Neutrophils Absolute: 3.6 10*3/uL (ref 1.4–7.0)
Neutrophils: 61 %
Platelets: 291 10*3/uL (ref 150–450)
RBC: 4.16 x10E6/uL (ref 3.77–5.28)
RDW: 13.4 % (ref 11.7–15.4)
WBC: 6.1 10*3/uL (ref 3.4–10.8)

## 2019-09-13 LAB — COMPREHENSIVE METABOLIC PANEL
ALT: 8 IU/L (ref 0–32)
AST: 14 IU/L (ref 0–40)
Albumin/Globulin Ratio: 1.4 (ref 1.2–2.2)
Albumin: 4.2 g/dL (ref 3.8–4.8)
Alkaline Phosphatase: 48 IU/L (ref 39–117)
BUN/Creatinine Ratio: 9 (ref 9–23)
BUN: 4 mg/dL — ABNORMAL LOW (ref 6–24)
Bilirubin Total: <0.2 mg/dL (ref 0.0–1.2)
CO2: 19 mmol/L — ABNORMAL LOW (ref 20–29)
Calcium: 9.5 mg/dL (ref 8.7–10.2)
Chloride: 103 mmol/L (ref 96–106)
Creatinine, Ser: 0.46 mg/dL — ABNORMAL LOW (ref 0.57–1.00)
GFR calc Af Amer: 143 mL/min/{1.73_m2} (ref >59)
GFR calc non Af Amer: 124 mL/min/{1.73_m2} (ref >59)
Globulin, Total: 2.9 g/dL (ref 1.5–4.5)
Glucose: 74 mg/dL (ref 65–99)
Potassium: 3.9 mmol/L (ref 3.5–5.2)
Sodium: 136 mmol/L (ref 134–144)
Total Protein: 7.1 g/dL (ref 6.0–8.5)

## 2019-09-13 LAB — HGB A1C W/O EAG: Hgb A1c MFr Bld: 6.2 % — ABNORMAL HIGH (ref 4.8–5.6)

## 2019-09-13 LAB — T4, FREE: Free T4: 1.07 ng/dL (ref 0.82–1.77)

## 2019-09-18 ENCOUNTER — Telehealth: Payer: Self-pay | Admitting: Internal Medicine

## 2019-09-18 NOTE — Telephone Encounter (Signed)
Spoke with patient. States she uses accu-chek glucose monitor. I asked if it was any special kind of accu-chek and patient stated it just says accu-chek

## 2019-09-18 NOTE — Telephone Encounter (Signed)
Patient called requesting Rx on glucose blood test strip to be called in at [facility].

## 2019-10-05 ENCOUNTER — Encounter (HOSPITAL_COMMUNITY): Payer: Self-pay | Admitting: Obstetrics and Gynecology

## 2019-10-05 ENCOUNTER — Inpatient Hospital Stay (HOSPITAL_COMMUNITY): Payer: BC Managed Care – PPO

## 2019-10-05 ENCOUNTER — Inpatient Hospital Stay (HOSPITAL_COMMUNITY)
Admission: EM | Admit: 2019-10-05 | Discharge: 2019-10-05 | Disposition: A | Discharge status: Home / Self Care | Payer: BC Managed Care – PPO | Attending: Obstetrics and Gynecology | Admitting: Obstetrics and Gynecology

## 2019-10-05 ENCOUNTER — Other Ambulatory Visit: Payer: Self-pay

## 2019-10-05 DIAGNOSIS — O4690 Antepartum hemorrhage, unspecified, unspecified trimester: Secondary | ICD-10-CM

## 2019-10-05 DIAGNOSIS — Z3A01 Less than 8 weeks gestation of pregnancy: Secondary | ICD-10-CM

## 2019-10-05 DIAGNOSIS — O039 Complete or unspecified spontaneous abortion without complication: Secondary | ICD-10-CM | POA: Diagnosis not present

## 2019-10-05 DIAGNOSIS — Z6790 Unspecified blood type, Rh positive: Secondary | ICD-10-CM

## 2019-10-05 DIAGNOSIS — O209 Hemorrhage in early pregnancy, unspecified: Secondary | ICD-10-CM | POA: Diagnosis not present

## 2019-10-05 DIAGNOSIS — B3731 Acute candidiasis of vulva and vagina: Secondary | ICD-10-CM

## 2019-10-05 DIAGNOSIS — Z3A11 11 weeks gestation of pregnancy: Secondary | ICD-10-CM

## 2019-10-05 DIAGNOSIS — Z3490 Encounter for supervision of normal pregnancy, unspecified, unspecified trimester: Secondary | ICD-10-CM

## 2019-10-05 DIAGNOSIS — B373 Candidiasis of vulva and vagina: Secondary | ICD-10-CM

## 2019-10-05 DIAGNOSIS — O035 Genital tract and pelvic infection following complete or unspecified spontaneous abortion: Secondary | ICD-10-CM | POA: Insufficient documentation

## 2019-10-05 LAB — URINALYSIS, ROUTINE W REFLEX MICROSCOPIC
Bilirubin Urine: NEGATIVE
Glucose, UA: NEGATIVE mg/dL
Ketones, ur: NEGATIVE mg/dL
Leukocytes,Ua: NEGATIVE
Nitrite: NEGATIVE
Protein, ur: NEGATIVE mg/dL
Specific Gravity, Urine: 1.008 (ref 1.005–1.030)
pH: 7 (ref 5.0–8.0)

## 2019-10-05 LAB — COMPREHENSIVE METABOLIC PANEL
ALT: 14 U/L (ref 0–44)
AST: 16 U/L (ref 15–41)
Albumin: 3.4 g/dL — ABNORMAL LOW (ref 3.5–5.0)
Alkaline Phosphatase: 35 U/L — ABNORMAL LOW (ref 38–126)
Anion gap: 9 (ref 5–15)
BUN: <5 mg/dL — ABNORMAL LOW (ref 6–20)
CO2: 22 mmol/L (ref 22–32)
Calcium: 9.3 mg/dL (ref 8.9–10.3)
Chloride: 108 mmol/L (ref 98–111)
Creatinine, Ser: 0.35 mg/dL — ABNORMAL LOW (ref 0.44–1.00)
GFR calc Af Amer: >60 mL/min (ref >60)
GFR calc non Af Amer: >60 mL/min (ref >60)
Glucose, Bld: 82 mg/dL (ref 70–99)
Potassium: 3.5 mmol/L (ref 3.5–5.1)
Sodium: 139 mmol/L (ref 135–145)
Total Bilirubin: 0.5 mg/dL (ref 0.3–1.2)
Total Protein: 6.7 g/dL (ref 6.5–8.1)

## 2019-10-05 LAB — WET PREP, GENITAL
Clue Cells Wet Prep HPF POC: NONE SEEN
Sperm: NONE SEEN
Trich, Wet Prep: NONE SEEN

## 2019-10-05 LAB — CBC
HCT: 34.8 % — ABNORMAL LOW (ref 36.0–46.0)
Hemoglobin: 11.3 g/dL — ABNORMAL LOW (ref 12.0–15.0)
MCH: 28.2 pg (ref 26.0–34.0)
MCHC: 32.5 g/dL (ref 30.0–36.0)
MCV: 86.8 fL (ref 80.0–100.0)
Platelets: 243 10*3/uL (ref 150–400)
RBC: 4.01 MIL/uL (ref 3.87–5.11)
RDW: 14.7 % (ref 11.5–15.5)
WBC: 5.3 10*3/uL (ref 4.0–10.5)
nRBC: 0 % (ref 0.0–0.2)

## 2019-10-05 LAB — HCG, QUANTITATIVE, PREGNANCY: hCG, Beta Chain, Quant, S: 4710 m[IU]/mL — ABNORMAL HIGH (ref <5)

## 2019-10-05 MED ORDER — FLUCONAZOLE 150 MG PO TABS: 150.0000 mg | ORAL_TABLET | Freq: Every day | ORAL | 0 refills | Status: AC

## 2019-10-05 MED ORDER — IBUPROFEN 600 MG PO TABS: 600.0000 mg | ORAL_TABLET | Freq: Four times a day (QID) | ORAL | 0 refills | Status: DC | PRN

## 2019-10-05 MED ORDER — OXYCODONE-ACETAMINOPHEN 5-325 MG PO TABS: 1.0000 | ORAL_TABLET | Freq: Four times a day (QID) | ORAL | 0 refills | Status: DC | PRN

## 2019-10-05 MED ORDER — MISOPROSTOL 200 MCG PO TABS: 800.0000 ug | ORAL_TABLET | Freq: Four times a day (QID) | ORAL | 0 refills | Status: DC

## 2019-10-05 MED ORDER — PROMETHAZINE HCL 12.5 MG PO TABS: 12.5000 mg | ORAL_TABLET | Freq: Four times a day (QID) | ORAL | 0 refills | Status: DC | PRN

## 2019-10-05 NOTE — MAU Note (Signed)
.   [name] is a 39 y.o. at [redacted]w[redacted]d here in MAU reporting: that she went to the bathroom at work and noticed moderate amount of vaginal bleeding. Lower abdominal cramping. Pt states that she has not had an U/S LMP: 07/15/19 Onset of complaint: this morning Pain score: 8 Vitals:   10/05/19 1111 10/05/19 1327  BP: (!) 142/102 123/60  Pulse: 79 71  Resp: 16 18  Temp: 99 F (37.2 C) 98.6 F (37 C)  SpO2: 100% 100%     FHT: Lab orders placed from triage: UA

## 2019-10-05 NOTE — Discharge Instructions (Signed)
Vaginal Yeast Infection, Adult  Vaginal yeast infection is a condition that causes vaginal discharge as well as soreness, swelling, and redness (inflammation) of the vagina. This is a common condition. Some women get this infection frequently. What are the causes? This condition is caused by a change in the normal balance of the yeast (candida) and bacteria that live in the vagina. This change causes an overgrowth of yeast, which causes the inflammation. What increases the risk? The condition is more likely to develop in women who:  Take antibiotic medicines.  Have diabetes.  Take birth control pills.  Are pregnant.  Douche often.  Have a weak body defense system (immune system).  Have been taking steroid medicines for a long time.  Frequently wear tight clothing. What are the signs or symptoms? Symptoms of this condition include:  White, thick, creamy vaginal discharge.  Swelling, itching, redness, and irritation of the vagina. The lips of the vagina (vulva) may be affected as well.  Pain or a burning feeling while urinating.  Pain during sex. How is this diagnosed? This condition is diagnosed based on:  Your medical history.  A physical exam.  A pelvic exam. Your health care provider will examine a sample of your vaginal discharge under a microscope. Your health care provider may send this sample for testing to confirm the diagnosis. How is this treated? This condition is treated with medicine. Medicines may be over-the-counter or prescription. You may be told to use one or more of the following:  Medicine that is taken by mouth (orally).  Medicine that is applied as a cream (topically).  Medicine that is inserted directly into the vagina (suppository). Follow these instructions at home:  Lifestyle  Do not have sex until your health care provider approves. Tell your sex partner that you have a yeast infection. That person should go to his or her health care  provider and ask if they should also be treated.  Do not wear tight clothes, such as pantyhose or tight pants.  Wear breathable cotton underwear. General instructions  Take or apply over-the-counter and prescription medicines only as told by your health care provider.  Eat more yogurt. This may help to keep your yeast infection from returning.  Do not use tampons until your health care provider approves.  Try taking a sitz bath to help with discomfort. This is a warm water bath that is taken while you are sitting down. The water should only come up to your hips and should cover your buttocks. Do this 3-4 times per day or as told by your health care provider.  Do not douche.  If you have diabetes, keep your blood sugar levels under control.  Keep all follow-up visits as told by your health care provider. This is important. Contact a health care provider if:  You have a fever.  Your symptoms go away and then return.  Your symptoms do not get better with treatment.  Your symptoms get worse.  You have new symptoms.  You develop blisters in or around your vagina.  You have blood coming from your vagina and it is not your menstrual period.  You develop pain in your abdomen. Summary  Vaginal yeast infection is a condition that causes discharge as well as soreness, swelling, and redness (inflammation) of the vagina.  This condition is treated with medicine. Medicines may be over-the-counter or prescription.  Take or apply over-the-counter and prescription medicines only as told by your health care provider.  Do not douche.   Do not have sex or use tampons until your health care provider approves.  Contact a health care provider if your symptoms do not get better with treatment or your symptoms go away and then return. This information is not intended to replace advice given to you by your health care provider. Make sure you discuss any questions you have with your health care  provider. Document Revised: 01/27/2019 Document Reviewed: 11/15/2017 Elsevier Patient Education  2020 [employer]    FACTS YOU SHOULD KNOW  WHAT IS AN EARLY PREGNANCY FAILURE? Once the egg is fertilized with the sperm and begins to develop, it attaches to the lining of the uterus. This early pregnancy tissue may not develop into an embryo (the beginning stage of a baby). Sometimes an embryo does develop but does not continue to grow. These problems can be seen on ultrasound.   MANAGEMNT OF EARLY PREGNANCY FAILURE: About 4 out of 100 (0.25%) women will have a pregnancy loss in her lifetime.  One in five pregnancies is found to be an early pregnancy failure.  There are 3 ways to care for an early pregnancy failure:   (1) Surgery, (2) Medicine, (3) Waiting for you to pass the pregnancy on your own. The decision as to how to proceed after being diagnosed with and early pregnancy failure is an individual one.  The decision can be made only after appropriate counseling.  You need to weigh the pros and cons of the 3 choices. Then you can make the choice that works for you. SURGERY (D&E) . Procedure over in 1 day . Requires being put to sleep . Bleeding may be light . Possible problems during surgery, including injury to womb(uterus) . Care provider has more control Medicine (CYTOTEC) . The complete procedure may take days to weeks . No Surgery . Bleeding may be heavy at times . There may be drug side effects . Patient has more control Waiting . You may choose to wait, in which case your own body may complete the passing of the abnormal early pregnancy on its own in about 2-4 weeks . Your bleeding may be heavy at times . There is a small possibility that you may need surgery if the bleeding is too much or not all of the pregnancy has passed. CYTOTEC MANAGEMENT Prostaglandins (cytotec) are the most widely used drug for this purpose. They cause the uterus to cramp and contract. You will  place the medicine yourself inside your vagina in the privacy of your home. Empting of the uterus should occur within 3 days but the process may continue for several weeks. The bleeding may seem heavy at times. POSSIBLE SIDE EFFECTS FROM CYTOTEC . Nausea   Vomiting . Diarrhea Fever . Chills  Hot Flashes Side effects  from the process of the early pregnancy failure include: . Cramping  Bleeding . Headaches  Dizziness RISKS: This is a low risk procedure. Less than 1 in 100 women has a complication. An incomplete passage of the early pregnancy may occur. Also, Hemorrhage (heavy bleeding) could happen.  Rarely the pregnancy will not be passed completely. Excessively heavy bleeding may occur.  Your doctor may need to perform surgery to empty the uterus (D&E). Afterwards: Everybody will feel differently after the early pregnancy completion. You may have soreness or cramps for a day or two. You may have soreness or cramps for day or two.  You may have light bleeding for up to 2 weeks. You may be as active as you feel like  being. If you have any of the following problems you may call [facility] at [phone]. . If you have pain that does not get better  with pain medication . Bleeding that soaks through 2 thick full-sized sanitary pads in an hour . Cramps that last longer than 2 days . Foul smelling discharge . Fever above 100.4 degrees F Even if you do not have any of these symptoms, you should have a follow-up exam to make sure you are healing properly. This appointment will be made for you before you leave the hospital. Your next normal period will start again in 4-6 week after the loss. You can get pregnant soon after the loss, so use birth control right away. Finally: Make sure all your questions are answered before during and after any procedure. Follow up with medical care and family planning methods.      Miscarriage A miscarriage is the loss of an unborn baby (fetus)  before the 20th week of pregnancy. Most miscarriages happen during the first 3 months of pregnancy. Sometimes, a miscarriage can happen before a woman knows that she is pregnant. Having a miscarriage can be an emotional experience. If you have had a miscarriage, talk with your health care provider about any questions you may have about miscarrying, the grieving process, and your plans for future pregnancy. What are the causes? A miscarriage may be caused by:  Problems with the genes or chromosomes of the fetus. These problems make it impossible for the baby to develop normally. They are often the result of random errors that occur early in the development of the baby, and are not passed from parent to child (not inherited).  Infection of the cervix or uterus.  Conditions that affect hormone balance in the body.  Problems with the cervix, such as the cervix opening and thinning before pregnancy is at term (cervical insufficiency).  Problems with the uterus. These may include: ? A uterus with an abnormal shape. ? Fibroids in the uterus. ? Congenital abnormalities. These are problems that were present at birth.  Certain medical conditions.  Smoking, drinking alcohol, or using drugs.  Injury (trauma). In many cases, the cause of a miscarriage is not known. What are the signs or symptoms? Symptoms of this condition include:  Vaginal bleeding or spotting, with or without cramps or pain.  Pain or cramping in the abdomen or lower back.  Passing fluid, tissue, or blood clots from the vagina. How is this diagnosed? This condition may be diagnosed based on:  A physical exam.  Ultrasound.  Blood tests.  Urine tests. How is this treated? Treatment for a miscarriage is sometimes not necessary if you naturally pass all the tissue that was in your uterus. If necessary, this condition may be treated with:  Dilation and curettage (D&C). This is a procedure in which the cervix is stretched  open and the lining of the uterus (endometrium) is scraped. This is done only if tissue from the fetus or placenta remains in the body (incomplete miscarriage).  Medicines, such as: ? Antibiotic medicine, to treat infection. ? Medicine to help the body pass any remaining tissue. ? Medicine to reduce (contract) the size of the uterus. These medicines may be given if you have a lot of bleeding. If you have Rh negative blood and your baby was Rh positive, you will need a shot of a medicine called Rh immunoglobulinto protect your future babies from Rh blood problems. "Rh-negative" and "Rh-positive" refer to whether or not  the blood has a specific protein found on the surface of red blood cells (Rh factor). Follow these instructions at home: Medicines   Take over-the-counter and prescription medicines only as told by your health care provider.  If you were prescribed antibiotic medicine, take it as told by your health care provider. Do not stop taking the antibiotic even if you start to feel better.  Do not take NSAIDs, such as aspirin and ibuprofen, unless they are approved by your health care provider. These medicines can cause bleeding. Activity  Rest as directed. Ask your health care provider what activities are safe for you.  Have someone help with home and family responsibilities during this time. General instructions  Keep track of the number of sanitary pads you use each day and how soaked (saturated) they are. Write down this information.  Monitor the amount of tissue or blood clots that you pass from your vagina. Save any large amounts of tissue for your health care provider to examine.  Do not use tampons, douche, or have sex until your health care provider approves.  To help you and your partner with the process of grieving, talk with your health care provider or seek counseling.  When you are ready, meet with your health care provider to discuss any important steps you should  take for your health. Also, discuss steps you should take to have a healthy pregnancy in the future.  Keep all follow-up visits as told by your health care provider. This is important. Where to find more information  The American Congress of Obstetricians and Gynecologists: www.acog.org  U.S. Department of Health and Programmer, systems of Women's Health: [place] Contact a health care provider if:  You have a fever or chills.  You have a foul smelling vaginal discharge.  You have more bleeding instead of less. Get help right away if:  You have severe cramps or pain in your back or abdomen.  You pass blood clots or tissue from your vagina that is walnut-sized or larger.  You soak more than 1 regular sanitary pad in an hour.  You become light-headed or weak.  You pass out.  You have feelings of sadness that take over your thoughts, or you have thoughts of hurting yourself. Summary  Most miscarriages happen in the first 3 months of pregnancy. Sometimes miscarriage happens before a woman even knows that she is pregnant.  Follow your health care provider's instruction for home care. Keep all follow-up appointments.  To help you and your partner with the process of grieving, talk with your health care provider or seek counseling. This information is not intended to replace advice given to you by your health care provider. Make sure you discuss any questions you have with your health care provider. Document Revised: 10/21/2018 Document Reviewed: 08/04/2016 Elsevier Patient Education  [place] Pregnancy Loss Pregnancy loss can happen any time during a pregnancy. Often the cause is not known. It is rarely because of anything you did. Pregnancy loss in early pregnancy (during the first trimester) is called a miscarriage. This type of pregnancy loss is the most common. Pregnancy loss that happens after 20 weeks of pregnancy is called fetal demise  if the baby's heart stops beating before birth. Fetal demise is much less common. Some women experience spontaneous labor shortly after fetal demise resulting in a stillborn birth (stillbirth). Any pregnancy loss can be devastating. You will need to recover both physically and emotionally. Most women are  able to get pregnant again after a pregnancy loss and deliver a healthy baby. How to manage emotional recovery  Pregnancy loss is very hard emotionally. You may feel many different emotions while you grieve. You may feel sad and angry. You may also feel guilty. It is normal to have periods of crying. Emotional recovery can take longer than physical recovery. It is different for everyone. Taking these steps can help you in managing this loss:  Remember that it is unlikely you did anything to cause the pregnancy loss.  Share your thoughts and feelings with friends, family, and your partner. Remember that your partner is also recovering emotionally.  Make sure you have a good support system. Do not spend too much time alone.  Meet with a pregnancy loss counselor or join a pregnancy loss support group.  Get enough sleep and eat a healthy diet. Return to regular exercise when you have recovered physically.  Do not use drugs or alcohol to manage your emotions.  Consider seeing a mental health professional to help you recover emotionally.  Ask a friend or loved one to help you decide what to do with any clothing and nursery items you received for your baby. In the case of a stillbirth, many women benefit from taking additional steps in the grieving process. You may want to:  Hold your baby after the birth.  Name your baby.  Request a birth certificate.  Create a keepsake such as handprints or footprints.  Dress your baby and have a picture taken.  Make funeral arrangements.  Ask for a baptism or blessing. Hospitals have staff members who can help you with all these arrangements. How to  recognize emotional stress It is normal to have emotional stress after a pregnancy loss. But emotional stress that lasts a long time or becomes severe requires treatment. Watch out for these signs of severe emotional stress:  Sadness, anger, or guilt that is not going away and is interfering with your normal activities.  Relationship problems that have occurred or gotten worse since the pregnancy loss.  Signs of depression that last longer than 2 weeks. These may include: ? Sadness. ? Anxiety. ? Hopelessness. ? Loss of interest in activities you enjoy. ? Inability to concentrate. ? Trouble sleeping or sleeping too much. ? Loss of appetite or overeating. ? Thoughts of death or of hurting yourself. Follow these instructions at home:  Take over-the-counter and prescription medicines only as told by your health care provider.  Rest at home until your energy level returns. Return to your normal activities as told by your health care provider. Ask your health care provider what activities are safe for you.  When you are ready, meet with your health care provider to discuss steps to take for a future pregnancy.  Keep all follow-up visits as told by your health care provider. This is important. Where to find support  To help you and your partner with the process of grieving, talk with your health care provider or seek counseling.  Consider meeting with others who have experienced pregnancy loss. Ask your health care provider about support groups and resources. Where to find more information  U.S. Department of Health and Cytogeneticist on Women's Health: http://hoffman.com/  American Pregnancy Association: www.americanpregnancy.org Contact a health care provider if:  You continue to experience grief, sadness, or lack of motivation for everyday activities, and those feelings do not improve over time.  You are struggling to recover emotionally, especially if you are using  alcohol  or substances to help. Get help right away if:  You have thoughts of hurting yourself or others. If you ever feel like you may hurt yourself or others, or have thoughts about taking your own life, get help right away. You can go to your nearest emergency department or call:  Your local emergency services (911 in the [place]  A suicide crisis helpline, such as the National Suicide Prevention Lifeline at (573)028-3092. This is open 24 hours a day. Summary  Any pregnancy loss can be difficult physically and emotionally.  You may experience many different emotions while you grieve. Emotional recovery can last longer than physical recovery.  It is normal to have emotional stress after a pregnancy loss. But emotional stress that lasts a long time or becomes severe requires treatment.  See your health care provider if you are struggling emotionally after a pregnancy loss. This information is not intended to replace advice given to you by your health care provider. Make sure you discuss any questions you have with your health care provider. Document Revised: 10/19/2018 Document Reviewed: 09/09/2017 Elsevier Patient Education  2020 [employer].

## 2019-10-05 NOTE — ED Provider Notes (Signed)
Patient seen in triage by myself.  She reports that she is approximately 3 months pregnant.  Awoke this morning and noted a clear liquid on her underwear and blood with urination and upon wiping.  She notes diffuse low back pain.  Denies abdominal pain, nausea, vomiting, fevers.   Physical Exam  BP (!) 142/102 (BP Location: Left Arm)   Pulse 79   Temp 99 F (37.2 C) (Oral)   Resp 16   Ht 5\' 4"  (1.626 m)   Wt 89.4 kg   LMP 07/15/2019   SpO2 100%   BMI 33.81 kg/m   Physical Exam Vitals and nursing note reviewed.  Constitutional:      General: She is not in acute distress.    Appearance: She is well-developed.  HENT:     Head: Normocephalic and atraumatic.  Eyes:     General:        Right eye: No discharge.        Left eye: No discharge.     Conjunctiva/sclera: Conjunctivae normal.  Neck:     Vascular: No JVD.     Trachea: No tracheal deviation.  Cardiovascular:     Rate and Rhythm: Normal rate.  Pulmonary:     Effort: Pulmonary effort is normal.  Abdominal:     General: There is no distension.     Palpations: Abdomen is soft.     Comments: Gravid abdomen  Musculoskeletal:     Comments: Diffuse lumbar spine and paralumbar muscle tenderness.  No deformity, crepitus, or step-off  Skin:    General: Skin is warm and dry.     Findings: No erythema.  Neurological:     Mental Status: She is alert.  Psychiatric:        Behavior: Behavior normal.     ED Course/Procedures     Procedures  MDM  Spoke with 09/12/2019 at [facility], who accepts for transfer to their facility.      [facility] 10/05/19 1151    10/07/19, MD 10/05/19 1215

## 2019-10-05 NOTE — MAU Provider Note (Signed)
History     CSN: [id number]  Arrival date and time: 10/05/19 1111   First Provider Initiated Contact with Patient 10/05/19 1404      Chief Complaint  Patient presents with  . Vaginal Bleeding   [name] is a 39 y.o. [id number] at 54w5dwho presents to MAU for vaginal bleeding which began this morning. Patient reports bleeding is some minor spotting on tissue after wiping.  Passing blood clots? no Blood soaking clothes? no Lightheaded/dizzy? no Significant pelvic pain or cramping? Minimal cramping and LBP Passed any tissue? no  Current pregnancy problems? Pt has not yet been seen, has pre-existing DM Blood Type? O Positive Allergies? NKDA Current medications? Metformin, Gabapentin, PNVs Current PNC & next appt? Pt will schedule NOB visit with [facility]  Pt denies vaginal discharge/odor/itching. Pt denies N/V, abdominal pain, constipation, diarrhea, or urinary problems. Pt denies fever, chills, fatigue, sweating or changes in appetite. Pt denies SOB or chest pain. Pt denies dizziness, HA, light-headedness, weakness.   OB History    Gravida  7   Para  5   Term  5   Preterm      AB  1   Living  1     SAB  1   TAB      Ectopic      Multiple  0   Live Births  1           Past Medical History:  Diagnosis Date  . Biological false positive RPR test 08/23/2016   FTA-ABS negative  . Diabetes mellitus without complication ([facility] [address] . Dyslipidemia 04/27/2017  . Gestational diabetes   . Hyperthyroidism   . Positive PPD 2016   + PPD :  history of BCG as well.  Was unable to treat for latent when pregnant.  Sent for CXR 12.2017, which was negative for active TB.  To go to PHD for treatment of latent.  07/2016 documented  . Thyroid nodule 02/05/2017   Thyroid nodule with FNA with atypia of unknown significance and hyperthyroidism (Grave's Disease).  Was seen by Dr. KLouanna [facility]Endocrine.  Apparently deciding whether to undergo thyroidectomy vs medication   (methimazole or RAI treatment per patient.) She elected to take Methimazole.  . Visual acuity reduced 2008   Wears glasses only for nighttime driving    Past Surgical History:  Procedure Laterality Date  . CESAREAN SECTION N/A 03/04/2015   Procedure: CESAREAN SECTION;  Surgeon: BFrederico Hamman MD;  [facility];  Service: Obstetrics;  Laterality: N/A;  . FNA thyroid nodule  06/22/2018   path equivocal--cells of unknown signficance.  Followed by Endocrine[facility] and being treated with Methimazole for hyperthryroidism.    Family History  Problem Relation Age of Onset  . Stroke Paternal Grandmother     Social History   Tobacco Use  . Smoking status: Never Smoker  . Smokeless tobacco: Never Used  Substance Use Topics  . Alcohol use: No    Alcohol/week: 0.0 standard drinks  . Drug use: No    Allergies: No Known Allergies  Medications Prior to Admission  Medication Sig Dispense Refill Last Dose  . metFORMIN (GLUCOPHAGE) 500 MG tablet Take 1 tablet (500 mg total) by mouth 2 (two) times daily with a meal. 60 tablet 11 10/05/2019 at Unknown time  . Prenatal Vit-Fe Fumarate-FA (PRENATAL VITAMIN PLUS LOW IRON) 27-1 MG TABS Take 1 tablet by mouth daily. 30 tablet 11 10/05/2019 at Unknown time  . blood glucose meter kit and supplies  KIT Check sugars twice daily before meals. 1 each 0   . Cholecalciferol (VITAMIN D3 PO) Take one pill daily, 1000 IU     . gabapentin (NEURONTIN) 100 MG capsule 2 caps by mouth at bedtime for 1 week, then 1 cap by mouth at bedtime for another week, then stop 21 capsule 0   . glucose blood test strip Use as instructed 100 each 12   . methimazole (TAPAZOLE) 10 MG tablet TAKE 1 TABLET BY MOUTH ONCE DAILY WITH FOOD FOR 30 DAYS       Review of Systems  Constitutional: Negative for chills, diaphoresis, fatigue and fever.  Eyes: Negative for visual disturbance.  Respiratory: Negative for shortness of breath.   Cardiovascular: Negative for chest pain.   Gastrointestinal: Negative for abdominal pain, constipation, diarrhea, nausea and vomiting.  Genitourinary: Positive for vaginal bleeding. Negative for dysuria, flank pain, frequency, pelvic pain, urgency and vaginal discharge.  Neurological: Negative for dizziness, weakness, light-headedness and headaches.   Physical Exam   Blood pressure 123/60, pulse 71, temperature 98.6 F (37 C), resp. rate 18, height 5' 4"  (1.626 m), weight 95.7 kg, last menstrual period 07/15/2019, SpO2 100 %.  Patient Vitals for the past 24 hrs:  BP Temp Temp src Pulse Resp SpO2 Height Weight  10/05/19 1327 123/60 98.6 F (37 C) -- 71 18 100 % -- 95.7 kg  10/05/19 1111 (!) 142/102 99 F (37.2 C) Oral 79 16 100 % 5' 4"  (1.626 m) 89.4 kg   Physical Exam  Constitutional: She is oriented to person, place, and time. She appears well-developed and well-nourished. No distress.  HENT:  Head: Normocephalic and atraumatic.  Respiratory: Effort normal.  GI: Soft. She exhibits no distension and no mass. There is no abdominal tenderness. There is no rebound and no guarding.  Neurological: She is alert and oriented to person, place, and time.  Skin: Skin is warm and dry. She is not diaphoretic.  Psychiatric: She has a normal mood and affect. Her behavior is normal. Judgment and thought content normal.  Pt declines pelvic exam.  Results for orders placed or performed during the hospital encounter of 10/05/19 (from the past 24 hour(s))  Urinalysis, Routine w reflex microscopic     Status: Abnormal   Collection Time: 10/05/19  2:01 PM  Result Value Ref Range   Color, Urine STRAW (A) YELLOW   APPearance CLEAR CLEAR   Specific Gravity, Urine 1.008 1.005 - 1.030   pH 7.0 5.0 - 8.0   Glucose, UA NEGATIVE NEGATIVE mg/dL   Hgb urine dipstick MODERATE (A) NEGATIVE   Bilirubin Urine NEGATIVE NEGATIVE   Ketones, ur NEGATIVE NEGATIVE mg/dL   Protein, ur NEGATIVE NEGATIVE mg/dL   Nitrite NEGATIVE NEGATIVE   Leukocytes,Ua  NEGATIVE NEGATIVE   RBC / HPF 0-5 0 - 5 RBC/hpf   WBC, UA 0-5 0 - 5 WBC/hpf   Bacteria, UA RARE (A) NONE SEEN   Squamous Epithelial / LPF 0-5 0 - 5  CBC     Status: Abnormal   Collection Time: 10/05/19  2:28 PM  Result Value Ref Range   WBC 5.3 4.0 - 10.5 K/uL   RBC 4.01 3.87 - 5.11 MIL/uL   Hemoglobin 11.3 (L) 12.0 - 15.0 g/dL   HCT 34.8 (L) 36.0 - 46.0 %   MCV 86.8 80.0 - 100.0 fL   MCH 28.2 26.0 - 34.0 pg   MCHC 32.5 30.0 - 36.0 g/dL   RDW 14.7 11.5 - 15.5 %   Platelets 243  150 - 400 K/uL   nRBC 0.0 0.0 - 0.2 %  Comprehensive metabolic panel     Status: Abnormal   Collection Time: 10/05/19  2:28 PM  Result Value Ref Range   Sodium 139 135 - 145 mmol/L   Potassium 3.5 3.5 - 5.1 mmol/L   Chloride 108 98 - 111 mmol/L   CO2 22 22 - 32 mmol/L   Glucose, Bld 82 70 - 99 mg/dL   BUN <5 (L) 6 - 20 mg/dL   Creatinine, Ser 0.35 (L) 0.44 - 1.00 mg/dL   Calcium 9.3 8.9 - 10.3 mg/dL   Total Protein 6.7 6.5 - 8.1 g/dL   Albumin 3.4 (L) 3.5 - 5.0 g/dL   AST 16 15 - 41 U/L   ALT 14 0 - 44 U/L   Alkaline Phosphatase 35 (L) 38 - 126 U/L   Total Bilirubin 0.5 0.3 - 1.2 mg/dL   GFR calc non Af Amer >60 >60 mL/min   GFR calc Af Amer >60 >60 mL/min   Anion gap 9 5 - 15  hCG, quantitative, pregnancy     Status: Abnormal   Collection Time: 10/05/19  2:28 PM  Result Value Ref Range   hCG, Beta Chain, Quant, S 4,710 (H) <5 mIU/mL  Wet prep, genital     Status: Abnormal   Collection Time: 10/05/19  4:07 PM  Result Value Ref Range   Yeast Wet Prep HPF POC PRESENT (A) NONE SEEN   Trich, Wet Prep NONE SEEN NONE SEEN   Clue Cells Wet Prep HPF POC NONE SEEN NONE SEEN   WBC, Wet Prep HPF POC MODERATE (A) NONE SEEN   Sperm NONE SEEN    US OB LESS THAN 14 WEEKS WITH OB TRANSVAGINAL  Result Date: 10/05/2019 CLINICAL DATA:  Pregnant patient in first-trimester pregnancy with vaginal bleeding. Last menstrual period 07/15/2019, gestational age by LMP 11 weeks 5 days. EXAM: OBSTETRIC <14 WK [place] AND  TRANSVAGINAL OB US TECHNIQUE: Both transabdominal and transvaginal ultrasound examinations were performed for complete evaluation of the gestation as well as the maternal uterus, adnexal regions, and pelvic cul-de-sac. Transvaginal technique was performed to assess early pregnancy. COMPARISON:  None this pregnancy. FINDINGS: Intrauterine gestational sac: Single Yolk sac:  Possibly visualized but echogenic. Embryo:  Visualized. Cardiac Activity: Not Visualized. Heart Rate: 0 bpm CRL:  9.3 mm   6 w   6 d Subchorionic hemorrhage:  Possibly small. Maternal uterus/adnexae: Both ovaries are visualized and are normal. Probable small corpus luteal cyst in the left ovary. No extra ovarian adnexal mass. No pelvic free fluid. IMPRESSION: Single intrauterine gestation with crown-rump length of 9 mm, but no fetal cardiac activity. Findings meet definitive criteria for failed pregnancy. This follows SRU consensus guidelines: Diagnostic Criteria for Nonviable Pregnancy Early in the First Trimester. Alison Stalling J Med [phone]. Electronically Signed   By: Keith Rake M.D.   On: 10/05/2019 15:50    MAU Course  Procedures  MDM -r/o ectopic -UA: straw/mod hgb/rare bacteria, sending urine for culture -CBC: WNL for pregnancy -CMP: serum creatinine 0.35, no abnormalities requiring treatment -[place]: single IUP, 59w6dwithout cardiac activity, meets definitive criteria for failed pregnancy -hCG: 4,710 -ABO: O Positive -WetPrep: +yeast -GC/CT collected  Early Intrauterine Pregnancy Failure Protocol X  Documented intrauterine pregnancy failure less than or equal to [redacted] weeks gestation (631w6dX  No serious current illness X  Baseline Hgb greater than or equal to 10g/dl (baseline hgb 11.3) X  Patient has easily accessible transportation to  the hospital  X  Clear preference  X  Practitioner/physician deems patient reliable  X  Counseling by practitioner or physician  X  Patient education by RN  X   Rho-Gam n/a (O  Positive) X  Medication dispensed  X  Cytotec 800 mcg, buccal X   Ibuprofen 600 mg 1 tablet by mouth every 6 hours as needed - prescribed  X   Percocet 5/325 1-2 tabs every 6 hours as needed - prescribed  X   Phenergan 12.5 mg by mouth every 6 hours as needed for nausea - prescribed  Reviewed with pt cytotec procedure.  Pt verbalizes that she lives close to the hospital and has transportation readily available.  Pt appears reliable and verbalizes understanding and agrees with plan of care.  -per consultation with Dr. Hulan Fray, patient eligible for cytotec.  -pt discharged to home in stable condition  Orders Placed This Encounter  Procedures  . Wet prep, genital    Standing Status:   Standing    Number of Occurrences:   1  . Culture, OB Urine    Standing Status:   Standing    Number of Occurrences:   1  . US OB LESS THAN 14 WEEKS WITH OB TRANSVAGINAL    Standing Status:   Standing    Number of Occurrences:   1    Order Specific Question:   Symptom/Reason for Exam    Answer:   Vaginal bleeding in pregnancy [[id number]]  . CBC    Standing Status:   Standing    Number of Occurrences:   1  . Comprehensive metabolic panel    Standing Status:   Standing    Number of Occurrences:   1  . hCG, quantitative, pregnancy    Standing Status:   Standing    Number of Occurrences:   1  . Urinalysis, Routine w reflex microscopic    Standing Status:   Standing    Number of Occurrences:   1  . Discharge patient    Order Specific Question:   Discharge disposition    Answer:   01-Home or Self Care [1]    Order Specific Question:   Discharge patient date    Answer:   10/05/2019   Meds ordered this encounter  Medications  . misoprostol (CYTOTEC) 200 MCG tablet    Sig: Take 4 tablets (800 mcg total) by mouth 4 (four) times daily for 1 dose.    Dispense:  4 tablet    Refill:  0    Order Specific Question:   Supervising Provider    Answer:   CONSTANT, PEGGY [[id number]]  . ibuprofen (ADVIL) 600 MG tablet     Sig: Take 1 tablet (600 mg total) by mouth every 6 (six) hours as needed for up to 30 doses for moderate pain or cramping.    Dispense:  30 tablet    Refill:  0    Order Specific Question:   Supervising Provider    Answer:   CONSTANT, PEGGY [[id number]]  . oxyCODONE-acetaminophen (PERCOCET) 5-325 MG tablet    Sig: Take 1-2 tablets by mouth every 6 (six) hours as needed for up to 8 doses for severe pain.    Dispense:  8 tablet    Refill:  0    Order Specific Question:   Supervising Provider    Answer:   CONSTANT, PEGGY [[id number]]  . promethazine (PHENERGAN) 12.5 MG tablet    Sig: Take 1 tablet (12.5 mg total) by mouth every 6 (  six) hours as needed for nausea or vomiting.    Dispense:  30 tablet    Refill:  0    Order Specific Question:   Supervising Provider    Answer:   CONSTANT, PEGGY [[id number]]  . fluconazole (DIFLUCAN) 150 MG tablet    Sig: Take 1 tablet (150 mg total) by mouth daily for 1 day.    Dispense:  1 tablet    Refill:  0    Order Specific Question:   Supervising Provider    Answer:   CONSTANT, PEGGY [[id number]]    Assessment and Plan   1. Intrauterine pregnancy   2. Vaginal bleeding in pregnancy   3. [redacted] weeks gestation of pregnancy   4. Miscarriage   5. Blood type, Rh positive   6. Vaginal yeast infection    Allergies as of 10/05/2019   No Known Allergies     Medication List    TAKE these medications   blood glucose meter kit and supplies Kit Check sugars twice daily before meals.   fluconazole 150 MG tablet Commonly known as: Diflucan Take 1 tablet (150 mg total) by mouth daily for 1 day.   gabapentin 100 MG capsule Commonly known as: NEURONTIN 2 caps by mouth at bedtime for 1 week, then 1 cap by mouth at bedtime for another week, then stop   glucose blood test strip Use as instructed   ibuprofen 600 MG tablet Commonly known as: ADVIL Take 1 tablet (600 mg total) by mouth every 6 (six) hours as needed for up to 30 doses for moderate pain or cramping.   metFORMIN  500 MG tablet Commonly known as: GLUCOPHAGE Take 1 tablet (500 mg total) by mouth 2 (two) times daily with a meal.   methimazole 10 MG tablet Commonly known as: TAPAZOLE TAKE 1 TABLET BY MOUTH ONCE DAILY WITH FOOD FOR 30 DAYS   misoprostol 200 MCG tablet Commonly known as: Cytotec Take 4 tablets (800 mcg total) by mouth 4 (four) times daily for 1 dose.   oxyCODONE-acetaminophen 5-325 MG tablet Commonly known as: Percocet Take 1-2 tablets by mouth every 6 (six) hours as needed for up to 8 doses for severe pain.   Prenatal Vitamin Plus Low Iron 27-1 MG Tabs Take 1 tablet by mouth daily.   promethazine 12.5 MG tablet Commonly known as: PHENERGAN Take 1 tablet (12.5 mg total) by mouth every 6 (six) hours as needed for nausea or vomiting.   VITAMIN D3 PO Take one pill daily, 1000 IU       -will call with culture results, if positive -pt advised to finish Gabapentin medication and then take medication for miscarriage d/t sedating side effects -pt advised to take medication for yeast after bleeding as stopped -message sent to [facility] to schedule for hCG in one week and hCG + provider visit in two weeks. -bleeding/pain/return MAU precautions given -pt discharged to home in stable condition  Elmyra Ricks E Saverio Kader 10/05/2019, 5:22 PM

## 2019-10-06 LAB — CULTURE, OB URINE: Culture: NO GROWTH

## 2019-10-06 LAB — GC/CHLAMYDIA PROBE AMP (~~LOC~~) NOT AT ARMC
Chlamydia: NEGATIVE
Comment: NEGATIVE
Comment: NORMAL
Neisseria Gonorrhea: NEGATIVE

## 2019-10-12 ENCOUNTER — Other Ambulatory Visit: Payer: Self-pay

## 2019-10-12 ENCOUNTER — Telehealth: Payer: Self-pay

## 2019-10-12 ENCOUNTER — Other Ambulatory Visit: Payer: BC Managed Care – PPO

## 2019-10-12 ENCOUNTER — Encounter: Payer: Self-pay | Admitting: Obstetrics

## 2019-10-12 DIAGNOSIS — O039 Complete or unspecified spontaneous abortion without complication: Secondary | ICD-10-CM | POA: Diagnosis not present

## 2019-10-12 NOTE — Telephone Encounter (Signed)
Pt is here in office for HCG lab draw after being diagnosed with SAB a couple days ago. Pt has c/o weakness and dizziness. Pt reports after she went home from the hospital and took the medicine she has been bleeding heavily. Pt reports she is filling up more than 1 pad per hour. I advised pt go to the hospital for evaluation. Offered to arrange transportation for pt, she declines and says she can drive herself. Told patient where to go for evaluation, she voices understanding.

## 2019-10-13 LAB — BETA HCG QUANT (REF LAB): hCG Quant: 61 m[IU]/mL

## 2019-10-16 ENCOUNTER — Other Ambulatory Visit: Payer: Self-pay

## 2019-10-16 ENCOUNTER — Encounter (HOSPITAL_COMMUNITY): Payer: Self-pay | Admitting: Obstetrics and Gynecology

## 2019-10-16 ENCOUNTER — Inpatient Hospital Stay (HOSPITAL_COMMUNITY): Payer: BC Managed Care – PPO

## 2019-10-16 ENCOUNTER — Inpatient Hospital Stay (HOSPITAL_COMMUNITY)
Admission: AD | Admit: 2019-10-16 | Discharge: 2019-10-16 | Disposition: A | Discharge status: Home / Self Care | Payer: BC Managed Care – PPO | Attending: Obstetrics and Gynecology | Admitting: Obstetrics and Gynecology

## 2019-10-16 DIAGNOSIS — N939 Abnormal uterine and vaginal bleeding, unspecified: Secondary | ICD-10-CM | POA: Diagnosis not present

## 2019-10-16 DIAGNOSIS — O034 Incomplete spontaneous abortion without complication: Secondary | ICD-10-CM | POA: Diagnosis not present

## 2019-10-16 DIAGNOSIS — O039 Complete or unspecified spontaneous abortion without complication: Secondary | ICD-10-CM | POA: Diagnosis not present

## 2019-10-16 DIAGNOSIS — Z3A00 Weeks of gestation of pregnancy not specified: Secondary | ICD-10-CM | POA: Diagnosis not present

## 2019-10-16 LAB — CBC
HCT: 35.7 % — ABNORMAL LOW (ref 36.0–46.0)
Hemoglobin: 11.2 g/dL — ABNORMAL LOW (ref 12.0–15.0)
MCH: 27.5 pg (ref 26.0–34.0)
MCHC: 31.4 g/dL (ref 30.0–36.0)
MCV: 87.5 fL (ref 80.0–100.0)
Platelets: 264 10*3/uL (ref 150–400)
RBC: 4.08 MIL/uL (ref 3.87–5.11)
RDW: 14.3 % (ref 11.5–15.5)
WBC: 4.9 10*3/uL (ref 4.0–10.5)
nRBC: 0 % (ref 0.0–0.2)

## 2019-10-16 LAB — HCG, QUANTITATIVE, PREGNANCY: hCG, Beta Chain, Quant, S: 28 m[IU]/mL — ABNORMAL HIGH (ref <5)

## 2019-10-16 NOTE — Discharge Instructions (Signed)
Dilation and Curettage or Vacuum Curettage  Dilation and curettage (D&C) and vacuum curettage are minor procedures. A D&C involves stretching (dilation) the cervix and scraping (curettage) the inside lining of the uterus (endometrium). During a D&C, tissue is gently scraped from the endometrium, starting from the top portion of the uterus down to the lowest part of the uterus (cervix). During a vacuum curettage, the lining and tissue in the uterus are removed with the use of gentle suction. Curettage may be performed to either diagnose or treat a problem. As a diagnostic procedure, curettage is performed to examine tissues from the uterus. A diagnostic curettage may be done if you have:  Irregular bleeding in the uterus.  Bleeding with the development of clots.  Spotting between menstrual periods.  Prolonged menstrual periods or other abnormal bleeding.  Bleeding after menopause.  No menstrual period (amenorrhea).  A change in size and shape of the uterus.  Abnormal endometrial cells discovered during a Pap test. As a treatment procedure, curettage may be performed for the following reasons:  Removal of an IUD (intrauterine device).  Removal of retained placenta after giving birth.  Abortion.  Miscarriage.  Removal of endometrial polyps.  Removal of uncommon types of noncancerous lumps (fibroids). Tell a health care provider about:  Any allergies you have, including allergies to prescribed medicine or latex.  All medicines you are taking, including vitamins, herbs, eye drops, creams, and over-the-counter medicines. This is especially important if you take any blood-thinning medicine. Bring a list of all of your medicines to your appointment.  Any problems you or family members have had with anesthetic medicines.  Any blood disorders you have.  Any surgeries you have had.  Your medical history and any medical conditions you have.  Whether you are pregnant or may be  pregnant.  Recent vaginal infections you have had.  Recent menstrual periods, bleeding problems you have had, and what form of birth control (contraception) you use. What are the risks? Generally, this is a safe procedure. However, problems may occur, including:  Infection.  Heavy vaginal bleeding.  Allergic reactions to medicines.  Damage to the cervix or other structures or organs.  Development of scar tissue (adhesions) inside the uterus, which can cause abnormal amounts of menstrual bleeding. This may make it harder to get pregnant in the future.  A hole (perforation) or puncture in the uterine wall. This is rare. What happens before the procedure? Staying hydrated Follow instructions from your health care provider about hydration, which may include:  Up to 2 hours before the procedure - you may continue to drink clear liquids, such as water, clear fruit juice, black coffee, and plain tea. Eating and drinking restrictions Follow instructions from your health care provider about eating and drinking, which may include:  8 hours before the procedure - stop eating heavy meals or foods such as meat, fried foods, or fatty foods.  6 hours before the procedure - stop eating light meals or foods, such as toast or cereal.  6 hours before the procedure - stop drinking milk or drinks that contain milk.  2 hours before the procedure - stop drinking clear liquids. If your health care provider told you to take your medicine(s) on the day of your procedure, take them with only a sip of water. Medicines  Ask your health care provider about: ? Changing or stopping your regular medicines. This is especially important if you are taking diabetes medicines or blood thinners. ? Taking medicines such as aspirin   and ibuprofen. These medicines can thin your blood. Do not take these medicines before your procedure if your health care provider instructs you not to.  You may be given antibiotic  medicine to help prevent infection. General instructions  For 24 hours before your procedure, do not: ? Douche. ? Use tampons. ? Use medicines, creams, or suppositories in the vagina. ? Have sexual intercourse.  You may be given a pregnancy test on the day of the procedure.  Plan to have someone take you home from the hospital or clinic.  You may have a blood or urine sample taken.  If you will be going home right after the procedure, plan to have someone with you for 24 hours. What happens during the procedure?  To reduce your risk of infection: ? Your health care team will wash or sanitize their hands. ? Your skin will be washed with soap.  An IV tube will be inserted into one of your veins.  You will be given one of the following: ? A medicine that numbs the area in and around the cervix (local anesthetic). ? A medicine to make you fall asleep (general anesthetic).  You will lie down on your back, with your feet in foot rests (stirrups).  The size and position of your uterus will be checked.  A lubricated instrument (speculum or Sims retractor) will be inserted into the back side of your vagina. The speculum will be used to hold apart the walls of your vagina so your health care provider can see your cervix.  A tool (tenaculum) will be attached to the lip of the cervix to stabilize it.  Your cervix will be softened and dilated. This may be done by: ? Taking a medicine. ? Having tapered dilators or thin rods (laminaria) or gradual widening instruments (tapered dilators) inserted into your cervix.  A small, sharp, curved instrument (curette) will be used to scrape a small amount of tissue or cells from the endometrium or cervical canal. In some cases, gentle suction is applied with the curette. The curette will then be removed. The cells will be taken to a lab for testing. The procedure may vary among health care providers and hospitals. What happens after the  procedure?  You may have mild cramping, backache, pain, and light bleeding or spotting. You may pass small blood clots from your vagina.  You may have to wear compression stockings. These stockings help to prevent blood clots and reduce swelling in your legs.  Your blood pressure, heart rate, breathing rate, and blood oxygen level will be monitored until the medicines you were given have worn off. Summary  Dilation and curettage (D&C) involves stretching (dilation) the cervix and scraping (curettage) the inside lining of the uterus (endometrium).  After the procedure, you may have mild cramping, backache, pain, and light bleeding or spotting. You may pass small blood clots from your vagina.  Plan to have someone take you home from the hospital or clinic. This information is not intended to replace advice given to you by your health care provider. Make sure you discuss any questions you have with your health care provider. Document Revised: 06/11/2017 Document Reviewed: 03/15/2016 Elsevier Patient Education  2020 [employer]  

## 2019-10-16 NOTE — MAU Provider Note (Signed)
History     CSN: [id number]  Arrival date and time: 10/16/19 1011   First Provider Initiated Contact with Patient 10/16/19 1056      Chief Complaint  Patient presents with  . Vaginal Bleeding  . Dizziness  . Abdominal Pain  . Fatigue   [name] is a 39 y.o. [id number] who is s/p SAB.  She presents today for Vaginal Bleeding.  She states she has been having heavy bleeding since having her miscarriage.  She states she has been unable to return to work due to the bleeding, pain, and fatigue.  Patient states the abdominal pain is intermittent and she describes it as cramping.  Patient states she has taken ibuprofen without relief of symptoms.  Patient rates the pain a 7-8/10.  Patient states she is using 4-5 pads a day and is passing large clots "like cement."  Patient states the blood in brown in color.       OB History    Gravida  7   Para  5   Term  5   Preterm      AB  1   Living  1     SAB  1   TAB      Ectopic      Multiple  0   Live Births  1           Past Medical History:  Diagnosis Date  . Biological false positive RPR test 08/23/2016   FTA-ABS negative  . Diabetes mellitus without complication ([place]) 1950  . Dyslipidemia 04/27/2017  . Gestational diabetes   . Hyperthyroidism   . Positive PPD 2016   + PPD :  history of BCG as well.  Was unable to treat for latent when pregnant.  Sent for CXR 12.2017, which was negative for active TB.  To go to PHD for treatment of latent.  07/2016 documented  . Thyroid nodule 02/05/2017   Thyroid nodule with FNA with atypia of unknown significance and hyperthyroidism (Grave's Disease).  Was seen by Dr. Louanna Raw Endocrine.  Apparently deciding whether to undergo thyroidectomy vs medication  (methimazole or RAI treatment per patient.) She elected to take Methimazole.  . Visual acuity reduced 2008   Wears glasses only for nighttime driving    Past Surgical History:  Procedure Laterality Date  . CESAREAN  SECTION N/A 03/04/2015   Procedure: CESAREAN SECTION;  Surgeon: Frederico Hamman, MD;  Location: [facility];  Service: Obstetrics;  Laterality: N/A;  . FNA thyroid nodule  06/22/2018   path equivocal--cells of unknown signficance.  Followed by Endocrine/[facility] and being treated with Methimazole for hyperthryroidism.    Family History  Problem Relation Age of Onset  . Stroke Paternal Grandmother     Social History   Tobacco Use  . Smoking status: Never Smoker  . Smokeless tobacco: Never Used  Substance Use Topics  . Alcohol use: No    Alcohol/week: 0.0 standard drinks  . Drug use: No    Allergies: No Known Allergies  Medications Prior to Admission  Medication Sig Dispense Refill Last Dose  . gabapentin (NEURONTIN) 100 MG capsule 2 caps by mouth at bedtime for 1 week, then 1 cap by mouth at bedtime for another week, then stop 21 capsule 0 10/15/2019 at 1930  . metFORMIN (GLUCOPHAGE) 500 MG tablet Take 1 tablet (500 mg total) by mouth 2 (two) times daily with a meal. 60 tablet 11 10/16/2019 at 0700  . Prenatal Vit-Fe Fumarate-FA (PRENATAL VITAMIN PLUS LOW  IRON) 27-1 MG TABS Take 1 tablet by mouth daily. 30 tablet 11 10/16/2019 at 0700  . blood glucose meter kit and supplies KIT Check sugars twice daily before meals. 1 each 0   . Cholecalciferol (VITAMIN D3 PO) Take one pill daily, 1000 IU   10/16/2019 at 0700  . glucose blood test strip Use as instructed 100 each 12   . ibuprofen (ADVIL) 600 MG tablet Take 1 tablet (600 mg total) by mouth every 6 (six) hours as needed for up to 30 doses for moderate pain or cramping. 30 tablet 0   . methimazole (TAPAZOLE) 10 MG tablet TAKE 1 TABLET BY MOUTH ONCE DAILY WITH FOOD FOR 30 DAYS     . misoprostol (CYTOTEC) 200 MCG tablet Take 4 tablets (800 mcg total) by mouth 4 (four) times daily for 1 dose. 4 tablet 0   . oxyCODONE-acetaminophen (PERCOCET) 5-325 MG tablet Take 1-2 tablets by mouth every 6 (six) hours as needed for up to 8 doses for severe pain. 8  tablet 0   . promethazine (PHENERGAN) 12.5 MG tablet Take 1 tablet (12.5 mg total) by mouth every 6 (six) hours as needed for nausea or vomiting. 30 tablet 0     Review of Systems  Constitutional: Positive for fatigue. Negative for chills and fever.  Respiratory: Negative for cough and shortness of breath.   Gastrointestinal: Positive for abdominal pain. Negative for constipation, diarrhea, nausea and vomiting.  Genitourinary: Positive for vaginal bleeding. Negative for difficulty urinating, dysuria and vaginal discharge.  Neurological: Negative for dizziness, light-headedness and headaches.   Physical Exam   Blood pressure (!) 135/92, pulse 68, temperature 98.4 F (36.9 C), temperature source Oral, resp. rate 18, height 5' 4"  (1.626 m), weight 87.4 kg, last menstrual period 07/15/2019, SpO2 100 %.  Physical Exam  Vitals reviewed. Constitutional: She is oriented to person, place, and time. She appears well-developed and well-nourished.  HENT:  Head: Normocephalic and atraumatic.  Eyes: Conjunctivae are normal.  Cardiovascular: Normal rate.  Respiratory: Effort normal.  GI: Soft.  Genitourinary: Cervix exhibits no motion tenderness, no discharge and no friability.    Vaginal discharge and bleeding present.  There is bleeding in the vagina.    Genitourinary Comments: Speculum Exam: -Normal External Genitalia: Non tender, Small amt dried blood noted at introitus. -Vaginal Vault: Pink mucosa. Wall prolapse noted.  Small amt dark brown mucoid discharge removed with faux swab x2.   -Cervix:Pink, no lesions, cysts, or polyps.  Appears closed. No active bleeding from os. -Bimanual Exam:  Difficult to assess d/t body habitus.     Musculoskeletal:        General: Normal range of motion.     Cervical back: Normal range of motion.  Neurological: She is alert and oriented to person, place, and time.  Skin: Skin is warm and dry.  Psychiatric: She has a normal mood and affect. Her behavior is  normal.    MAU Course  Procedures Results for orders placed or performed during the hospital encounter of 10/16/19 (from the past 24 hour(s))  CBC     Status: Abnormal   Collection Time: 10/16/19 11:11 AM  Result Value Ref Range   WBC 4.9 4.0 - 10.5 K/uL   RBC 4.08 3.87 - 5.11 MIL/uL   Hemoglobin 11.2 (L) 12.0 - 15.0 g/dL   HCT 35.7 (L) 36.0 - 46.0 %   MCV 87.5 80.0 - 100.0 fL   MCH 27.5 26.0 - 34.0 pg   MCHC 31.4 30.0 -  36.0 g/dL   RDW 14.3 11.5 - 15.5 %   Platelets 264 150 - 400 K/uL   nRBC 0.0 0.0 - 0.2 %  hCG, quantitative, pregnancy     Status: Abnormal   Collection Time: 10/16/19 11:11 AM  Result Value Ref Range   hCG, Beta Chain, Quant, S 28 (H) <5 mIU/mL   US OB Transvaginal  Result Date: 10/16/2019 CLINICAL DATA:  Follow-up failed pregnancy EXAM: TRANSVAGINAL OB ULTRASOUND TECHNIQUE: Transvaginal ultrasound was performed for complete evaluation of the gestation as well as the maternal uterus, adnexal regions, and pelvic cul-de-sac. COMPARISON:  10/05/2019 FINDINGS: Intrauterine gestational sac: Previously seen gestational sac is no longer identified. There is however echogenic material within the endometrial canal which demonstrate increased vascularity highly suggestive of retained products of conception. Subchorionic hemorrhage:  None visualized. Maternal uterus/adnexae: Ovaries are within normal limits. IMPRESSION: Echogenic material within the endometrial canal with increased vascularity highly suspicious for retained products of conception. Electronically Signed   By: Inez Catalina M.D.   On: 10/16/2019 12:54    MDM Pelvic Exam Labs: CBC, hCG Ultrasound Assessment and Plan  39 year old Vaginal Bleeding S/P SAB  -POC Reviewed. -Exam performed and findings discussed. -Patient offered and declines pain medication. -Labs ordered -Will send for [place] and await results.  Maryann Conners 10/16/2019, 10:56 AM   Reassessment (1:24 PM) RPOC  -[place] returns suspicious for  RPOC. -Dr. Ilda Basset consulted and updated on patient status and results. *Advised okay for repeat cytotec or D&C this week. -Patient presented with options including risks and benefits of each. -Patient opts for [facility] and questions what she should do for work this week. -Will give excuse for OOW until next week. -Message sent to J.Battle for scheduling of D&C this week.  -Bleeding Precautions Given. -Patient without further questions or concerns. -Encouraged to call or return to MAU if symptoms worsen or with the onset of new symptoms. -Discharged to home in stable condition.  Maryann Conners MSN, CNM Advanced Practice Provider, [facility]

## 2019-10-16 NOTE — MAU Note (Signed)
Dx with failed preg 3/25. Still having much bleeding, weakness, dizzy and no appetite. Changing soaked pad every . Had f/u blood work last thur, discussed with nurse, was told to go to hosp due to complaints.  Pain comes and goes.

## 2019-10-17 ENCOUNTER — Other Ambulatory Visit: Payer: Self-pay

## 2019-10-17 ENCOUNTER — Other Ambulatory Visit: Payer: Self-pay | Admitting: Obstetrics and Gynecology

## 2019-10-17 ENCOUNTER — Other Ambulatory Visit (HOSPITAL_COMMUNITY)
Admission: RE | Admit: 2019-10-17 | Discharge: 2019-10-17 | Disposition: A | Discharge status: Home / Self Care | Payer: BC Managed Care – PPO | Source: Ambulatory Visit | Attending: Obstetrics and Gynecology | Admitting: Obstetrics and Gynecology

## 2019-10-17 ENCOUNTER — Encounter (HOSPITAL_BASED_OUTPATIENT_CLINIC_OR_DEPARTMENT_OTHER): Payer: Self-pay | Admitting: Obstetrics and Gynecology

## 2019-10-17 DIAGNOSIS — Z20822 Contact with and (suspected) exposure to covid-19: Secondary | ICD-10-CM | POA: Insufficient documentation

## 2019-10-17 DIAGNOSIS — Z01812 Encounter for preprocedural laboratory examination: Secondary | ICD-10-CM | POA: Insufficient documentation

## 2019-10-17 LAB — SARS CORONAVIRUS 2 (TAT 6-24 HRS): SARS Coronavirus 2: NEGATIVE

## 2019-10-17 NOTE — H&P (Signed)
[name] is an 39 y.o. female here for scheduled D&E. Patient diagnosed with a miscarriage on 3/25. She presented to MAU on 4/5 with persistent vaginal bleeding with passage of clots and was diagnosed with retained POC. Patient opted for surgical management with D&E. She is without complaints. She denies feeling dizzy, lightheadedness.    Menstrual History: Patient's last menstrual period was 07/15/2019.    Past Medical History:  Diagnosis Date  . Biological false positive RPR test 08/23/2016   FTA-ABS negative  . Diabetes mellitus without complication ([place]) 1025  . Dyslipidemia 04/27/2017  . Gestational diabetes   . Hyperthyroidism   . Positive PPD 2016   + PPD :  history of BCG as well.  Was unable to treat for latent when pregnant.  Sent for CXR 12.2017, which was negative for active TB.  To go to PHD for treatment of latent.  07/2016 documented  . Thyroid nodule 02/05/2017   Thyroid nodule with FNA with atypia of unknown significance and hyperthyroidism (Grave's Disease).  Was seen by Dr. Louanna Raw Endocrine.  Apparently deciding whether to undergo thyroidectomy vs medication  (methimazole or RAI treatment per patient.) She elected to take Methimazole.  . Visual acuity reduced 2008   Wears glasses only for nighttime driving    Past Surgical History:  Procedure Laterality Date  . CESAREAN SECTION N/A 03/04/2015   Procedure: CESAREAN SECTION;  Surgeon: Frederico Hamman, MD;  Location: [facility];  Service: Obstetrics;  Laterality: N/A;  . FNA thyroid nodule  06/22/2018   path equivocal--cells of unknown signficance.  Followed by Endocrine/[facility] and being treated with Methimazole for hyperthryroidism.    Family History  Problem Relation Age of Onset  . Stroke Paternal Grandmother     Social History:  reports that she has never smoked. She has never used smokeless tobacco. She reports that she does not drink alcohol or use drugs.  Allergies: No Known Allergies  Medications  Prior to Admission  Medication Sig Dispense Refill Last Dose  . blood glucose meter kit and supplies KIT Check sugars twice daily before meals. 1 each 0 10/17/2019 at Unknown time  . Cholecalciferol (VITAMIN D3 PO) Take one pill daily, 1000 IU   10/17/2019 at Unknown time  . gabapentin (NEURONTIN) 100 MG capsule 2 caps by mouth at bedtime for 1 week, then 1 cap by mouth at bedtime for another week, then stop 21 capsule 0 10/16/2019 at Unknown time  . glucose blood test strip Use as instructed 100 each 12 10/17/2019 at Unknown time  . metFORMIN (GLUCOPHAGE) 500 MG tablet Take 1 tablet (500 mg total) by mouth 2 (two) times daily with a meal. 60 tablet 11 10/17/2019 at Unknown time    Review of Systems See pertinent in HPI. All other systems reviewed and negative. Blood pressure 120/69, pulse 70, temperature (!) 97.3 F (36.3 C), temperature source Tympanic, resp. rate 18, height 5' 4"  (1.626 m), weight 87 kg, last menstrual period 07/15/2019, SpO2 100 %. Physical Exam GENERAL: Well-developed, well-nourished female in no acute distress.  HEENT: Normocephalic, atraumatic. Sclerae anicteric.  NECK: Supple. Normal thyroid.  LUNGS: Clear to auscultation bilaterally.  HEART: Regular rate and rhythm. ABDOMEN: Soft, nontender, nondistended. No organomegaly. PELVIC: Defered to OR. EXTREMITIES: No cyanosis, clubbing, or edema, 2+ distal pulses.  Results for orders placed or performed during the hospital encounter of 10/18/19 (from the past 24 hour(s))  Glucose, capillary     Status: None   Collection Time: 10/18/19  8:08 AM  Result Value Ref Range   Glucose-Capillary 95 70 - 99 mg/dL    US OB Transvaginal  Result Date: 10/16/2019 CLINICAL DATA:  Follow-up failed pregnancy EXAM: TRANSVAGINAL OB ULTRASOUND TECHNIQUE: Transvaginal ultrasound was performed for complete evaluation of the gestation as well as the maternal uterus, adnexal regions, and pelvic cul-de-sac. COMPARISON:  10/05/2019 FINDINGS:  Intrauterine gestational sac: Previously seen gestational sac is no longer identified. There is however echogenic material within the endometrial canal which demonstrate increased vascularity highly suggestive of retained products of conception. Subchorionic hemorrhage:  None visualized. Maternal uterus/adnexae: Ovaries are within normal limits. IMPRESSION: Echogenic material within the endometrial canal with increased vascularity highly suspicious for retained products of conception. Electronically Signed   By: Inez Catalina M.D.   On: 10/16/2019 12:54    Assessment/Plan: 39 yo with retained POC s/p miscarriage - Risks, benefits and alternatives were explained including but not limited to risks of bleeding, infection, uterine perforation and damage to adjacent organs. - Patient verbalized understanding and all questions were answered  Arthea Nobel 10/18/2019, 8:21 AM

## 2019-10-18 ENCOUNTER — Encounter (HOSPITAL_BASED_OUTPATIENT_CLINIC_OR_DEPARTMENT_OTHER): Payer: Self-pay | Admitting: Obstetrics and Gynecology

## 2019-10-18 ENCOUNTER — Ambulatory Visit (HOSPITAL_BASED_OUTPATIENT_CLINIC_OR_DEPARTMENT_OTHER): Payer: BC Managed Care – PPO | Admitting: Anesthesiology

## 2019-10-18 ENCOUNTER — Other Ambulatory Visit: Payer: Self-pay

## 2019-10-18 ENCOUNTER — Encounter (HOSPITAL_BASED_OUTPATIENT_CLINIC_OR_DEPARTMENT_OTHER)
Admission: RE | Disposition: A | Discharge status: Home / Self Care | Payer: Self-pay | Source: Home / Self Care | Attending: Obstetrics and Gynecology

## 2019-10-18 ENCOUNTER — Ambulatory Visit (HOSPITAL_BASED_OUTPATIENT_CLINIC_OR_DEPARTMENT_OTHER)
Admission: RE | Admit: 2019-10-18 | Discharge: 2019-10-18 | Disposition: A | Discharge status: Home / Self Care | Payer: BC Managed Care – PPO | Attending: Obstetrics and Gynecology | Admitting: Obstetrics and Gynecology

## 2019-10-18 DIAGNOSIS — O021 Missed abortion: Secondary | ICD-10-CM | POA: Insufficient documentation

## 2019-10-18 DIAGNOSIS — E669 Obesity, unspecified: Secondary | ICD-10-CM | POA: Diagnosis not present

## 2019-10-18 DIAGNOSIS — O24311 Unspecified pre-existing diabetes mellitus in pregnancy, first trimester: Secondary | ICD-10-CM | POA: Diagnosis not present

## 2019-10-18 DIAGNOSIS — Z3A09 9 weeks gestation of pregnancy: Secondary | ICD-10-CM | POA: Insufficient documentation

## 2019-10-18 DIAGNOSIS — E785 Hyperlipidemia, unspecified: Secondary | ICD-10-CM | POA: Insufficient documentation

## 2019-10-18 DIAGNOSIS — Z7984 Long term (current) use of oral hypoglycemic drugs: Secondary | ICD-10-CM | POA: Diagnosis not present

## 2019-10-18 DIAGNOSIS — O99281 Endocrine, nutritional and metabolic diseases complicating pregnancy, first trimester: Secondary | ICD-10-CM | POA: Insufficient documentation

## 2019-10-18 DIAGNOSIS — E119 Type 2 diabetes mellitus without complications: Secondary | ICD-10-CM | POA: Insufficient documentation

## 2019-10-18 HISTORY — PX: DILATION AND EVACUATION: SHX1459

## 2019-10-18 LAB — GLUCOSE, CAPILLARY
Glucose-Capillary: 95 mg/dL (ref 70–99)
Glucose-Capillary: 77 mg/dL (ref 70–99)

## 2019-10-18 SURGERY — DILATION AND EVACUATION, UTERUS
Anesthesia: General | Site: Vagina

## 2019-10-18 MED ORDER — MIDAZOLAM HCL 5 MG/5ML IJ SOLN
INTRAMUSCULAR | Status: DC | PRN
  Administered 2019-10-18: 2 mg via INTRAVENOUS

## 2019-10-18 MED ORDER — FENTANYL CITRATE (PF) 100 MCG/2ML IJ SOLN
INTRAMUSCULAR | Status: DC | PRN
  Administered 2019-10-18: 100 ug via INTRAVENOUS

## 2019-10-18 MED ORDER — PHENYLEPHRINE 40 MCG/ML (10ML) SYRINGE FOR IV PUSH (FOR BLOOD PRESSURE SUPPORT)
PREFILLED_SYRINGE | INTRAVENOUS | Status: AC
  Filled 2019-10-18: qty 10

## 2019-10-18 MED ORDER — ONDANSETRON HCL 4 MG/2ML IJ SOLN
INTRAMUSCULAR | Status: AC
  Filled 2019-10-18: qty 2

## 2019-10-18 MED ORDER — SCOPOLAMINE 1 MG/3DAYS TD PT72
MEDICATED_PATCH | TRANSDERMAL | Status: AC
  Filled 2019-10-18: qty 1

## 2019-10-18 MED ORDER — HYDROCODONE-ACETAMINOPHEN 5-300 MG PO TABS: 1.0000 | ORAL_TABLET | ORAL | 0 refills | Status: DC | PRN

## 2019-10-18 MED ORDER — MIDAZOLAM HCL 2 MG/2ML IJ SOLN
INTRAMUSCULAR | Status: AC
  Filled 2019-10-18: qty 2

## 2019-10-18 MED ORDER — SODIUM CHLORIDE 0.9 % IV SOLN
INTRAVENOUS | Status: AC
  Filled 2019-10-18: qty 100

## 2019-10-18 MED ORDER — DIPHENHYDRAMINE HCL 50 MG/ML IJ SOLN
INTRAMUSCULAR | Status: DC | PRN
  Administered 2019-10-18: 6.25 mg via INTRAVENOUS

## 2019-10-18 MED ORDER — ACETAMINOPHEN 500 MG PO TABS
ORAL_TABLET | ORAL | Status: AC
  Filled 2019-10-18: qty 2

## 2019-10-18 MED ORDER — EPHEDRINE 5 MG/ML INJ
INTRAVENOUS | Status: AC
  Filled 2019-10-18: qty 10

## 2019-10-18 MED ORDER — KETOROLAC TROMETHAMINE 30 MG/ML IJ SOLN: 30.0000 mg | Freq: Once | INTRAMUSCULAR | Status: DC | PRN

## 2019-10-18 MED ORDER — SUCCINYLCHOLINE CHLORIDE 200 MG/10ML IV SOSY
PREFILLED_SYRINGE | INTRAVENOUS | Status: AC
  Filled 2019-10-18: qty 10

## 2019-10-18 MED ORDER — ACETAMINOPHEN 500 MG PO TABS
1000.0000 mg | ORAL_TABLET | Freq: Once | ORAL | Status: AC
  Administered 2019-10-18: 1000 mg via ORAL

## 2019-10-18 MED ORDER — DEXAMETHASONE SODIUM PHOSPHATE 4 MG/ML IJ SOLN
INTRAMUSCULAR | Status: DC | PRN
  Administered 2019-10-18: 4 mg via INTRAVENOUS

## 2019-10-18 MED ORDER — IBUPROFEN 600 MG PO TABS: 600.0000 mg | ORAL_TABLET | Freq: Four times a day (QID) | ORAL | 3 refills | Status: DC | PRN

## 2019-10-18 MED ORDER — LIDOCAINE HCL (CARDIAC) PF 100 MG/5ML IV SOSY
PREFILLED_SYRINGE | INTRAVENOUS | Status: DC | PRN
  Administered 2019-10-18: 100 mg via INTRAVENOUS

## 2019-10-18 MED ORDER — LIDOCAINE 2% (20 MG/ML) 5 ML SYRINGE
INTRAMUSCULAR | Status: AC
  Filled 2019-10-18: qty 5

## 2019-10-18 MED ORDER — CARBOPROST TROMETHAMINE 250 MCG/ML IM SOLN
INTRAMUSCULAR | Status: AC
  Filled 2019-10-18: qty 1

## 2019-10-18 MED ORDER — FENTANYL CITRATE (PF) 100 MCG/2ML IJ SOLN
25.0000 ug | INTRAMUSCULAR | Status: DC | PRN
  Administered 2019-10-18: 11:00:00 25 ug via INTRAVENOUS

## 2019-10-18 MED ORDER — SODIUM CHLORIDE 0.9 % IV SOLN
100.0000 mg | Freq: Two times a day (BID) | INTRAVENOUS | Status: DC
  Administered 2019-10-18: 08:00:00 100 mg via INTRAVENOUS

## 2019-10-18 MED ORDER — DEXAMETHASONE SODIUM PHOSPHATE 10 MG/ML IJ SOLN
INTRAMUSCULAR | Status: AC
  Filled 2019-10-18: qty 1

## 2019-10-18 MED ORDER — LACTATED RINGERS IV SOLN: INTRAVENOUS | Status: DC

## 2019-10-18 MED ORDER — FENTANYL CITRATE (PF) 100 MCG/2ML IJ SOLN
INTRAMUSCULAR | Status: AC
  Filled 2019-10-18: qty 2

## 2019-10-18 MED ORDER — METHYLERGONOVINE MALEATE 0.2 MG/ML IJ SOLN
INTRAMUSCULAR | Status: AC
  Filled 2019-10-18: qty 1

## 2019-10-18 MED ORDER — ONDANSETRON HCL 4 MG/2ML IJ SOLN
INTRAMUSCULAR | Status: DC | PRN
  Administered 2019-10-18: 4 mg via INTRAVENOUS

## 2019-10-18 MED ORDER — SCOPOLAMINE 1 MG/3DAYS TD PT72
1.0000 | MEDICATED_PATCH | TRANSDERMAL | Status: DC
  Administered 2019-10-18: 1.5 mg via TRANSDERMAL

## 2019-10-18 MED ORDER — SCOPOLAMINE 1 MG/3DAYS TD PT72: 1.0000 | MEDICATED_PATCH | Freq: Once | TRANSDERMAL | Status: DC

## 2019-10-18 MED ORDER — DOXYCYCLINE HYCLATE 100 MG IV SOLR: 200.0000 mg | INTRAVENOUS | Status: DC

## 2019-10-18 MED ORDER — DIPHENHYDRAMINE HCL 50 MG/ML IJ SOLN
INTRAMUSCULAR | Status: AC
  Filled 2019-10-18: qty 1

## 2019-10-18 MED ORDER — PROMETHAZINE HCL 25 MG/ML IJ SOLN: 6.2500 mg | INTRAMUSCULAR | Status: DC | PRN

## 2019-10-18 SURGICAL SUPPLY — 25 items
CATH ROBINSON RED A/P 14FR (CATHETERS) ×1 IMPLANT
DECANTER SPIKE VIAL GLASS SM (MISCELLANEOUS) ×1 IMPLANT
FILTER UTR ASPR ASSEMBLY (MISCELLANEOUS) IMPLANT
GLOVE BIOGEL PI IND STRL 6.5 (GLOVE) ×1 IMPLANT
GLOVE BIOGEL PI IND STRL 7.0 (GLOVE) ×1 IMPLANT
GLOVE BIOGEL PI INDICATOR 6.5 (GLOVE) ×1
GLOVE BIOGEL PI INDICATOR 7.0 (GLOVE) ×1
GLOVE SURG SS PI 6.0 STRL IVOR (GLOVE) ×2 IMPLANT
GOWN STRL REUS W/TWL LRG LVL3 (GOWN DISPOSABLE) ×5 IMPLANT
HIBICLENS CHG 4% 4OZ BTL (MISCELLANEOUS) ×1 IMPLANT
HOSE CONNECTING 18IN BERKELEY (TUBING) ×1 IMPLANT
KIT BERKELEY 1ST TRI 3/8 NO TR (MISCELLANEOUS) ×2 IMPLANT
KIT BERKELEY 1ST TRIMESTER 3/8 (MISCELLANEOUS) ×1 IMPLANT
NS IRRIG 1000ML POUR BTL (IV SOLUTION) ×2 IMPLANT
PACK VAGINAL MINOR WOMEN LF (CUSTOM PROCEDURE TRAY) ×2 IMPLANT
PAD OB MATERNITY 4.3X12.25 (PERSONAL CARE ITEMS) ×2 IMPLANT
PAD PREP 24X48 CUFFED NSTRL (MISCELLANEOUS) ×2 IMPLANT
SET BERKELEY SUCTION TUBING (SUCTIONS) ×2 IMPLANT
SLEEVE SCD COMPRESS KNEE MED (MISCELLANEOUS) ×2 IMPLANT
TOWEL GREEN STERILE FF (TOWEL DISPOSABLE) ×4 IMPLANT
VACURETTE 10 RIGID CVD (CANNULA) IMPLANT
VACURETTE 6 ASPIR F TIP BERK (CANNULA) IMPLANT
VACURETTE 7MM CVD STRL WRAP (CANNULA) IMPLANT
VACURETTE 8 RIGID CVD (CANNULA) IMPLANT
VACURETTE 9 RIGID CVD (CANNULA) ×1 IMPLANT

## 2019-10-18 NOTE — Transfer of Care (Signed)
Immediate Anesthesia Transfer of Care Note  Patient: [name]  Procedure(s) Performed: DILATATION AND EVACUATION (N/A Vagina )  Patient Location: PACU  Anesthesia Type:General  Level of Consciousness: sedated  Airway & Oxygen Therapy: Patient Spontanous Breathing and Patient connected to face mask oxygen  Post-op Assessment: Report given to RN and Post -op Vital signs reviewed and stable  Post vital signs: Reviewed and stable  Last Vitals:  Vitals Value Taken Time  BP    Temp    Pulse    Resp    SpO2      Last Pain:  Vitals:   10/18/19 0804  TempSrc: Tympanic  PainSc: 8          Complications: No apparent anesthesia complications

## 2019-10-18 NOTE — Discharge Instructions (Signed)
Last dose of Tylenol given at 8:14 this morning. Do not take additional dose until 2:14pm.  Post Anesthesia Home Care Instructions  Activity: Get plenty of rest for the remainder of the day. A responsible individual must stay with you for 24 hours following the procedure.  For the next 24 hours, DO NOT: -Drive a car -Paediatric nurse -Drink alcoholic beverages -Take any medication unless instructed by your physician -Make any legal decisions or sign important papers.  Meals: Start with liquid foods such as gelatin or soup. Progress to regular foods as tolerated. Avoid greasy, spicy, heavy foods. If nausea and/or vomiting occur, drink only clear liquids until the nausea and/or vomiting subsides. Call your physician if vomiting continues.  Special Instructions/Symptoms: Your throat may feel dry or sore from the anesthesia or the breathing tube placed in your throat during surgery. If this causes discomfort, gargle with warm salt water. The discomfort should disappear within 24 hours.  If you had a scopolamine patch placed behind your ear for the management of post- operative nausea and/or vomiting:  1. The medication in the patch is effective for 72 hours, after which it should be removed.  Wrap patch in a tissue and discard in the trash. Wash hands thoroughly with soap and water. 2. You may remove the patch earlier than 72 hours if you experience unpleasant side effects which may include dry mouth, dizziness or visual disturbances. 3. Avoid touching the patch. Wash your hands with soap and water after contact with the patch.    Dilation and Curettage or Vacuum Curettage  Dilation and curettage (D&C) and vacuum curettage are minor procedures. A D&C involves stretching (dilation) the cervix and scraping (curettage) the inside lining of the uterus (endometrium). During a D&C, tissue is gently scraped from the endometrium, starting from the top portion of the uterus down to the lowest part of  the uterus (cervix). During a vacuum curettage, the lining and tissue in the uterus are removed with the use of gentle suction. Curettage may be performed to either diagnose or treat a problem. As a diagnostic procedure, curettage is performed to examine tissues from the uterus. A diagnostic curettage may be done if you have:  Irregular bleeding in the uterus.  Bleeding with the development of clots.  Spotting between menstrual periods.  Prolonged menstrual periods or other abnormal bleeding.  Bleeding after menopause.  No menstrual period (amenorrhea).  A change in size and shape of the uterus.  Abnormal endometrial cells discovered during a Pap test. As a treatment procedure, curettage may be performed for the following reasons:  Removal of an IUD (intrauterine device).  Removal of retained placenta after giving birth.  Abortion.  Miscarriage.  Removal of endometrial polyps.  Removal of uncommon types of noncancerous lumps (fibroids). Tell a health care provider about:  Any allergies you have, including allergies to prescribed medicine or latex.  All medicines you are taking, including vitamins, herbs, eye drops, creams, and over-the-counter medicines. This is especially important if you take any blood-thinning medicine. Bring a list of all of your medicines to your appointment.  Any problems you or family members have had with anesthetic medicines.  Any blood disorders you have.  Any surgeries you have had.  Your medical history and any medical conditions you have.  Whether you are pregnant or may be pregnant.  Recent vaginal infections you have had.  Recent menstrual periods, bleeding problems you have had, and what form of birth control (contraception) you use. What are the  risks? Generally, this is a safe procedure. However, problems may occur, including:  Infection.  Heavy vaginal bleeding.  Allergic reactions to medicines.  Damage to the cervix or  other structures or organs.  Development of scar tissue (adhesions) inside the uterus, which can cause abnormal amounts of menstrual bleeding. This may make it harder to get pregnant in the future.  A hole (perforation) or puncture in the uterine wall. This is rare. What happens before the procedure? Staying hydrated Follow instructions from your health care provider about hydration, which may include:  Up to 2 hours before the procedure - you may continue to drink clear liquids, such as water, clear fruit juice, black coffee, and plain tea. Eating and drinking restrictions Follow instructions from your health care provider about eating and drinking, which may include:  8 hours before the procedure - stop eating heavy meals or foods such as meat, fried foods, or fatty foods.  6 hours before the procedure - stop eating light meals or foods, such as toast or cereal.  6 hours before the procedure - stop drinking milk or drinks that contain milk.  2 hours before the procedure - stop drinking clear liquids. If your health care provider told you to take your medicine(s) on the day of your procedure, take them with only a sip of water. Medicines  Ask your health care provider about: ? Changing or stopping your regular medicines. This is especially important if you are taking diabetes medicines or blood thinners. ? Taking medicines such as aspirin and ibuprofen. These medicines can thin your blood. Do not take these medicines before your procedure if your health care provider instructs you not to.  You may be given antibiotic medicine to help prevent infection. General instructions  For 24 hours before your procedure, do not: ? Douche. ? Use tampons. ? Use medicines, creams, or suppositories in the vagina. ? Have sexual intercourse.  You may be given a pregnancy test on the day of the procedure.  Plan to have someone take you home from the hospital or clinic.  You may have a blood or  urine sample taken.  If you will be going home right after the procedure, plan to have someone with you for 24 hours. What happens during the procedure?  To reduce your risk of infection: ? Your health care team will wash or sanitize their hands. ? Your skin will be washed with soap.  An IV tube will be inserted into one of your veins.  You will be given one of the following: ? A medicine that numbs the area in and around the cervix (local anesthetic). ? A medicine to make you fall asleep (general anesthetic).  You will lie down on your back, with your feet in foot rests (stirrups).  The size and position of your uterus will be checked.  A lubricated instrument (speculum or Sims retractor) will be inserted into the back side of your vagina. The speculum will be used to hold apart the walls of your vagina so your health care provider can see your cervix.  A tool (tenaculum) will be attached to the lip of the cervix to stabilize it.  Your cervix will be softened and dilated. This may be done by: ? Taking a medicine. ? Having tapered dilators or thin rods (laminaria) or gradual widening instruments (tapered dilators) inserted into your cervix.  A small, sharp, curved instrument (curette) will be used to scrape a small amount of tissue or cells from the  endometrium or cervical canal. In some cases, gentle suction is applied with the curette. The curette will then be removed. The cells will be taken to a lab for testing. The procedure may vary among health care providers and hospitals. What happens after the procedure?  You may have mild cramping, backache, pain, and light bleeding or spotting. You may pass small blood clots from your vagina.  You may have to wear compression stockings. These stockings help to prevent blood clots and reduce swelling in your legs.  Your blood pressure, heart rate, breathing rate, and blood oxygen level will be monitored until the medicines you were given  have worn off. Summary  Dilation and curettage (D&C) involves stretching (dilation) the cervix and scraping (curettage) the inside lining of the uterus (endometrium).  After the procedure, you may have mild cramping, backache, pain, and light bleeding or spotting. You may pass small blood clots from your vagina.  Plan to have someone take you home from the hospital or clinic. This information is not intended to replace advice given to you by your health care provider. Make sure you discuss any questions you have with your health care provider. Document Revised: 06/11/2017 Document Reviewed: 03/15/2016 Elsevier Patient Education  2020 [employer].

## 2019-10-18 NOTE — Anesthesia Procedure Notes (Signed)
Procedure Name: LMA Insertion Date/Time: 10/18/2019 9:38 AM Performed by: Ronnette Hila, CRNA Pre-anesthesia Checklist: Patient identified, Emergency Drugs available, Suction available and Patient being monitored Patient Re-evaluated:Patient Re-evaluated prior to induction Oxygen Delivery Method: Circle system utilized Preoxygenation: Pre-oxygenation with 100% oxygen Induction Type: IV induction Ventilation: Mask ventilation without difficulty LMA: LMA inserted LMA Size: 4.0 Number of attempts: 1 Airway Equipment and Method: Bite block Placement Confirmation: positive ETCO2 Tube secured with: Tape Dental Injury: Teeth and Oropharynx as per pre-operative assessment

## 2019-10-18 NOTE — Anesthesia Preprocedure Evaluation (Addendum)
Anesthesia Evaluation  Patient identified by MRN, date of birth, ID band Patient awake    Reviewed: Allergy & Precautions, NPO status , Patient's Chart, lab work & pertinent test results  Airway Mallampati: II  TM Distance: >3 FB Neck ROM: Full    Dental  (+) Teeth Intact, Dental Advisory Given   Pulmonary neg pulmonary ROS,    Pulmonary exam normal breath sounds clear to auscultation       Cardiovascular negative cardio ROS Normal cardiovascular exam Rhythm:Regular Rate:Normal     Neuro/Psych  Neuromuscular disease    GI/Hepatic negative GI ROS, Neg liver ROS,   Endo/Other  diabetes, Type 2, Oral Hypoglycemic AgentsHyperthyroidism Obesity   Renal/GU negative Renal ROS     Musculoskeletal negative musculoskeletal ROS (+)   Abdominal   Peds  Hematology  (+) Blood dyscrasia, anemia ,   Anesthesia Other Findings Day of surgery medications reviewed with the patient.  Reproductive/Obstetrics Missed abortion                              Anesthesia Physical Anesthesia Plan  ASA: II  Anesthesia Plan: General   Post-op Pain Management:    Induction: Intravenous  PONV Risk Score and Plan: 4 or greater and Scopolamine patch - Pre-op, Midazolam, Dexamethasone and Ondansetron  Airway Management Planned: LMA  Additional Equipment:   Intra-op Plan:   Post-operative Plan: Extubation in OR  Informed Consent: I have reviewed the patients History and Physical, chart, labs and discussed the procedure including the risks, benefits and alternatives for the proposed anesthesia with the patient or authorized representative who has indicated his/her understanding and acceptance.     Dental advisory given  Plan Discussed with: CRNA  Anesthesia Plan Comments:        Anesthesia Quick Evaluation

## 2019-10-18 NOTE — Op Note (Signed)
[name] PROCEDURE DATE: 10/18/2019  PREOPERATIVE DIAGNOSIS: retained products of conception POSTOPERATIVE DIAGNOSIS: The same. PROCEDURE:     Dilation and Evacuation. SURGEON:  Dr. Catalina Antigua  INDICATIONS: 39 y.o. G7P5011with retained POC, needing surgical completion.  Risks of surgery were discussed with the patient including but not limited to: bleeding which may require transfusion; infection which may require antibiotics; injury to uterus or surrounding organs;need for additional procedures including laparotomy or laparoscopy; possibility of intrauterine scarring which may impair future fertility; and other postoperative/anesthesia complications. Written informed consent was obtained.    FINDINGS:  A 9- week size anteverted uterus, moderate amounts of products of conception, specimen sent to pathology.  ANESTHESIA:    Monitored intravenous sedation, paracervical block. INTRAVENOUS FLUIDS:  400 ml of LR ESTIMATED BLOOD LOSS:  Less than 50 ml. SPECIMENS:  Products of conception sent to pathology COMPLICATIONS:  None immediate.  PROCEDURE DETAILS:  The patient received intravenous antibiotics while in the preoperative area.  She was then taken to the operating room where general anesthesia was administered and was found to be adequate.  After an adequate timeout was performed, she was placed in the dorsal lithotomy position and examined; then prepped and draped in the sterile manner. A vaginal speculum was then placed in the patient's vagina and a single tooth tenaculum was applied to the anterior lip of the cervix.  The cervix was gently dilated to accommodate a 9 mm suction curette that was gently advanced to the uterine fundus.  The suction device was then activated and curette slowly rotated to clear the uterus of products of conception.  A sharp curettage was then performed to confirm complete emptying of the uterus. There was minimal bleeding noted and the tenaculum removed with  good hemostasis noted.   All instruments were removed from the patient's vagina. The patient tolerated the procedure well and was taken to the recovery area awake, and in stable condition.  The patient will be discharged to home as per PACU criteria.  Routine postoperative instructions given.  She was prescribed Percocet, Ibuprofen and Colace.  She will follow up in the clinic in 2 weeks for postoperative evaluation.

## 2019-10-18 NOTE — Anesthesia Postprocedure Evaluation (Signed)
Anesthesia Post Note  Patient: [name]  Procedure(s) Performed: DILATATION AND EVACUATION (N/A Vagina )     Patient location during evaluation: PACU Anesthesia Type: General Level of consciousness: awake and alert Pain management: pain level controlled Vital Signs Assessment: post-procedure vital signs reviewed and stable Respiratory status: spontaneous breathing, nonlabored ventilation, respiratory function stable and patient connected to nasal cannula oxygen Cardiovascular status: blood pressure returned to baseline and stable Postop Assessment: no apparent nausea or vomiting Anesthetic complications: no    Last Vitals:  Vitals:   10/18/19 1115 10/18/19 1145  BP: 132/84 126/75  Pulse: 63 62  Resp: 13 14  Temp:  36.7 C  SpO2: 100% 100%    Last Pain:  Vitals:   10/18/19 1049  TempSrc:   PainSc: 5                  Cecile Hearing

## 2019-10-19 ENCOUNTER — Encounter: Payer: Self-pay | Admitting: *Deleted

## 2019-10-19 LAB — SURGICAL PATHOLOGY

## 2019-10-23 ENCOUNTER — Other Ambulatory Visit: Payer: Self-pay

## 2019-10-23 ENCOUNTER — Ambulatory Visit: Payer: BC Managed Care – PPO

## 2019-10-23 VITALS — BP 126/82 | HR 83 | Ht 64.0 in | Wt 193.7 lb

## 2019-10-23 DIAGNOSIS — O034 Incomplete spontaneous abortion without complication: Secondary | ICD-10-CM | POA: Diagnosis not present

## 2019-10-23 NOTE — Progress Notes (Signed)
History:  Ms. [name] is a 39 y.o. [id number] who presents to clinic today for follow-up after a D&E on 4/7.  She reports that she is doing well following the procedure. She has some residual pain from the procedure which she rates as a 7/10 and describes as burning. This pain is responsive to Ibuprofen, is improving over time, and is not worrisome to her at this time.   Her bleeding is much improved. She is changing pads 2-3 times per day, but they are not soaked.   She had some constipation immediately following the procedure but this has resolved and her bowels have returned to her baseline.   She reports that she is coping well and that she has a strong support system. She is grateful to her medical team and feels that the D&E was the proper treatment.   She is an observant Muslim and is preparing for the month of Ramadan. Because she is still bleeding, she is going to postpone her fast for a few days, but she does intend to fast once her symptoms resolve.    Review of Systems:  Review of Systems  Constitutional: Negative.   HENT: Negative.   Eyes: Negative.   Respiratory: Negative.   Cardiovascular: Negative.   Gastrointestinal: Negative for abdominal pain, diarrhea, nausea and vomiting. Constipation: resolved.  Genitourinary: Negative for dysuria and flank pain.       + burning pelvic pain  Musculoskeletal: Negative.   Skin: Negative.   Neurological: Negative for dizziness and headaches.  Endo/Heme/Allergies: Negative.   Psychiatric/Behavioral: Negative for depression. The patient is not nervous/anxious.       Objective:  Physical Exam BP 126/82   Pulse 83   Ht 5\' 4"  (1.626 m)   Wt 193 lb 11.2 oz (87.9 kg)   LMP 07/15/2019   BMI 33.25 kg/m  Physical Exam Constitutional:      General: She is not in acute distress.    Appearance: Normal appearance.  Neurological:     Mental Status: She is alert.  Psychiatric:        Mood and Affect: Mood normal.      Behavior: Behavior normal.        Thought Content: Thought content normal.      Labs and Imaging No results found for this or any previous visit (from the past 24 hour(s)).  No results found.   Assessment & Plan:  1. Retained products of conception after miscarriage Doing well following D&E.  - Beta hCG quant (ref lab) - Hemoglobin and hematocrit, blood   09/12/2019, Medical Student 10/23/2019 10:56 AM

## 2019-10-23 NOTE — Progress Notes (Signed)
Follow up SAB retained POC.  Reports no problems today.

## 2019-10-23 NOTE — Patient Instructions (Signed)
Pregnancy After Age 41 Women who become pregnant after the age of 41 have a higher risk for certain problems during pregnancy. This is because older women may already have health problems before becoming pregnant. Older women who are healthy before pregnancy may still develop problems during pregnancy. These problems may affect the mother, the unborn baby (fetus), or both. What are the risks for me? If you are over age 41 and you want to become pregnant or are pregnant, you may have a higher risk of:  Not being able to get pregnant (infertility).  Going into labor early (preterm labor).  Needing surgical delivery of your baby (cesarean delivery, or C-section).  Having high blood pressure (hypertension).  Having complications during pregnancy, such as high blood pressure and other symptoms (preeclampsia).  Having diabetes during pregnancy (gestational diabetes).  Being pregnant with more than one baby.  Loss of the unborn baby before 20 weeks (miscarriage) or after 20 weeks of pregnancy (stillbirth). What are the risks for my baby? Babies born to women over the age of 41 have a higher risk for:  Being born early (prematurity).  Low birth weight, which is less than 5 lb, 8 oz (2.5 kg).  Birth defects, such as Down syndrome and cleft palate.  Health complications, including problems with growth and development. How is prenatal care different for women over age 41? All women should see their health care provider before they try to become pregnant. This is especially important for women over the age of 41. Tell your health care provider about:  Any health problems you have.  Any medicines you take.  Any family history of health problems or chromosome-related defects.  Any problems you have had with past pregnancies or deliveries. If you are over age 41 and you plan to become pregnant:  Start taking a daily multivitamin a month or more before you try to get pregnant. Your  multivitamin should contain 400 mcg (micrograms) of folic acid. If you are over age 41 and pregnant, make sure you:  Keep taking your multivitamin unless your health care provider tells you not to take it.  Keep all prenatal visits as told by your health care provider. This is important.  Have ultrasounds regularly throughout your pregnancy to check for problems.  Talk with your health care provider about other prenatal screening tests that you may need. What additional prenatal tests are needed? Screening tests show whether your baby has a higher risk for birth defects than other babies. Screening tests include:  Ultrasound tests to look for markers that indicate a risk for birth defects.  Maternal blood screening. These are blood tests that measure certain substances in your blood to determine your baby's risk for defects. Screening tests do not show whether your baby has or does not have defects. They only show your baby's risk for certain defects. If your screening tests show that risk factors are present, you may need tests to confirm the defect (diagnostic testing). These tests may include:  Chorionic villus sampling. For this procedure, a tissue sample is taken from the organ that forms in your uterus to nourish your baby (placenta). The sample is removed through your cervix or abdomen and tested.  Amniocentesis. For this procedure, a small amount of the fluid that surrounds the baby in the uterus (amniotic fluid) is removed and tested. What can I do to stay healthy during my pregnancy? Staying healthy during pregnancy can help you and your baby to have a lower risk for   problems during pregnancy, during delivery, or both. Talk with your health care provider for specific instructions about staying healthy during your pregnancy. Nutrition   At each meal, eat a variety of foods from each of the five food groups. These groups include: ? Proteins such as lean meats, poultry, fish that is  low in fat, beans, eggs, and nuts. ? Vegetables such as leafy greens, raw and cooked vegetables, and vegetable juice. ? Fruits that are fresh, frozen, or canned, or 100% fruit juice. ? Dairy products such as low-fat yogurt, cheese, and milk. ? Whole grains including rice, cereal, pasta, and bread.  Talk with your health care provider about how much food in each group is right for you.  Follow instructions from your health care provider about eating and drinking restrictions during pregnancy. ? Do not eat raw eggs, raw meat, or raw fish or seafood. ? Do not eat any fish that contains high amounts of mercury, such as swordfish or mackerel.  Drink 6-8 or more glasses of water a day. You should drink enough fluid to keep your urine pale yellow. Managing weight gain  Ask your health care provider how much weight gain is healthy during pregnancy.  Stay at a healthy weight. If needed, work with your health care provider to lose weight safely. Activity  Exercise regularly, as directed by your health care provider. Ask your health care provider what forms of exercise are safe for you. General instructions  Do not use any products that contain nicotine or tobacco, such as cigarettes and e-cigarettes. If you need help quitting, ask your health care provider.  Do not drink alcohol, use drugs, or abuse prescription medicine.  Take over-the-counter and prescription medicines only as told by your health care provider.  Do not use hot tubs, steam rooms, or saunas.  Talk with your health care provider about your risk of exposure to harmful environmental conditions. This includes exposure to chemicals, radiation, cleaning products, and cat feces. Follow advice from your health care provider about how to limit your exposure. Summary  Women who become pregnant after the age of 41 have a higher risk for complications during pregnancy.  Problems may affect the mother, the unborn baby (fetus), or  both.  All women should see their health care provider before they try to become pregnant. This is especially important for women over the age of 41.  Staying healthy during pregnancy can help both you and your baby to have a lower risk for some of the problems that can happen during pregnancy, during delivery, or both. This information is not intended to replace advice given to you by your health care provider. Make sure you discuss any questions you have with your health care provider. Document Revised: 10/21/2018 Document Reviewed: 10/19/2016 Elsevier Patient Education  2020 Elsevier Inc.  

## 2019-10-24 LAB — HEMOGLOBIN AND HEMATOCRIT, BLOOD
Hematocrit: 35 % (ref 34.0–46.6)
Hemoglobin: 11.1 g/dL (ref 11.1–15.9)

## 2019-10-24 LAB — BETA HCG QUANT (REF LAB): hCG Quant: 4 m[IU]/mL

## 2019-10-27 ENCOUNTER — Telehealth: Payer: Self-pay

## 2019-10-27 NOTE — Telephone Encounter (Signed)
S/w pt and advised of results and provider recommendations. °

## 2019-10-31 DIAGNOSIS — E0500 Thyrotoxicosis with diffuse goiter without thyrotoxic crisis or storm: Secondary | ICD-10-CM | POA: Diagnosis not present

## 2019-10-31 DIAGNOSIS — E042 Nontoxic multinodular goiter: Secondary | ICD-10-CM | POA: Diagnosis not present

## 2019-11-02 ENCOUNTER — Telehealth (INDEPENDENT_AMBULATORY_CARE_PROVIDER_SITE_OTHER): Payer: BC Managed Care – PPO | Admitting: Obstetrics and Gynecology

## 2019-11-02 ENCOUNTER — Encounter: Payer: Self-pay | Admitting: Obstetrics and Gynecology

## 2019-11-02 DIAGNOSIS — Z9889 Other specified postprocedural states: Secondary | ICD-10-CM

## 2019-11-02 NOTE — Progress Notes (Signed)
GYNECOLOGY VIRTUAL VISIT ENCOUNTER NOTE  Provider location: [facility] [place]   I connected with [name] on 11/02/19 at 10:15 AM EDT by MyChart Video Encounter at home and verified that I am speaking with the correct person using two identifiers.   I discussed the limitations, risks, security and privacy concerns of performing an evaluation and management service virtually and the availability of in person appointments. I also discussed with the patient that there may be a patient responsible charge related to this service. The patient expressed understanding and agreed to proceed.   History:  [name] is a 39 y.o. G59P5011 female being evaluated today for post op check following D&E for retained POC on 10/18/19. She denies any abnormal vaginal discharge, bleeding, pelvic pain or other concerns.       Past Medical History:  Diagnosis Date  . Biological false positive RPR test 08/23/2016   FTA-ABS negative  . Diabetes mellitus without complication (Newborn) 7619  . Dyslipidemia 04/27/2017  . Gestational diabetes   . Hyperthyroidism   . Positive PPD 2016   + PPD :  history of BCG as well.  Was unable to treat for latent when pregnant.  Sent for CXR 12.2017, which was negative for active TB.  To go to [facility] for treatment of latent.  07/2016 documented  . Thyroid nodule 02/05/2017   Thyroid nodule with FNA with atypia of unknown significance and hyperthyroidism (Grave's Disease).  Was seen by Dr. Louanna Raw Endocrine.  Apparently deciding whether to undergo thyroidectomy vs medication  (methimazole or RAI treatment per patient.) She elected to take Methimazole.  . Visual acuity reduced 2008   Wears glasses only for nighttime driving   Past Surgical History:  Procedure Laterality Date  . CESAREAN SECTION N/A 03/04/2015   Procedure: CESAREAN SECTION;  Surgeon: Frederico Hamman, MD;  Location: [facility];  Service: Obstetrics;  Laterality: N/A;  . DILATION AND  EVACUATION N/A 10/18/2019   Procedure: DILATATION AND EVACUATION;  Surgeon: Mora Bellman, MD;  Location: [facility];  Service: Gynecology;  Laterality: N/A;  . FNA thyroid nodule  06/22/2018   path equivocal--cells of unknown signficance.  Followed by Endocrine[facility] and being treated with Methimazole for hyperthryroidism.   The following portions of the patient's history were reviewed and updated as appropriate: allergies, current medications, past family history, past medical history, past social history, past surgical history and problem list.   Health Maintenance:  Normal pap and negative HRHPV on 2018.  Normal mammogram on 04/2019.   Review of Systems:  Pertinent items noted in HPI and remainder of comprehensive ROS otherwise negative.  Physical Exam:   General:  Alert, oriented and cooperative. Patient appears to be in no acute distress.  Mental Status: Normal mood and affect. Normal behavior. Normal judgment and thought content.   Respiratory: Normal respiratory effort, no problems with respiration noted  Rest of physical exam deferred due to type of encounter  Labs and Imaging Results for orders placed or performed in visit on 10/23/19 (from the past 336 hour(s))  Beta hCG quant (ref lab)   Collection Time: 10/23/19 11:03 AM  Result Value Ref Range   hCG Quant 4 mIU/mL  Hemoglobin and hematocrit, blood   Collection Time: 10/23/19 11:03 AM  Result Value Ref Range   Hemoglobin 11.1 11.1 - 15.9 g/dL   Hematocrit 35.0 34.0 - 46.6 %   US OB Transvaginal  Result Date: 10/16/2019 CLINICAL DATA:  Follow-up failed pregnancy  EXAM: TRANSVAGINAL OB ULTRASOUND TECHNIQUE: Transvaginal ultrasound was performed for complete evaluation of the gestation as well as the maternal uterus, adnexal regions, and pelvic cul-de-sac. COMPARISON:  10/05/2019 FINDINGS: Intrauterine gestational sac: Previously seen gestational sac is no longer identified. There is however echogenic material  within the endometrial canal which demonstrate increased vascularity highly suggestive of retained products of conception. Subchorionic hemorrhage:  None visualized. Maternal uterus/adnexae: Ovaries are within normal limits. IMPRESSION: Echogenic material within the endometrial canal with increased vascularity highly suspicious for retained products of conception. Electronically Signed   By: Alcide Clever M.D.   On: 10/16/2019 12:54   US OB LESS THAN 14 WEEKS WITH OB TRANSVAGINAL  Result Date: 10/05/2019 CLINICAL DATA:  Pregnant patient in first-trimester pregnancy with vaginal bleeding. Last menstrual period 07/15/2019, gestational age by LMP 11 weeks 5 days. EXAM: OBSTETRIC <14 WK [place] AND TRANSVAGINAL OB US TECHNIQUE: Both transabdominal and transvaginal ultrasound examinations were performed for complete evaluation of the gestation as well as the maternal uterus, adnexal regions, and pelvic cul-de-sac. Transvaginal technique was performed to assess early pregnancy. COMPARISON:  None this pregnancy. FINDINGS: Intrauterine gestational sac: Single Yolk sac:  Possibly visualized but echogenic. Embryo:  Visualized. Cardiac Activity: Not Visualized. Heart Rate: 0 bpm CRL:  9.3 mm   6 w   6 d Subchorionic hemorrhage:  Possibly small. Maternal uterus/adnexae: Both ovaries are visualized and are normal. Probable small corpus luteal cyst in the left ovary. No extra ovarian adnexal mass. No pelvic free fluid. IMPRESSION: Single intrauterine gestation with crown-rump length of 9 mm, but no fetal cardiac activity. Findings meet definitive criteria for failed pregnancy. This follows SRU consensus guidelines: Diagnostic Criteria for Nonviable Pregnancy Early in the First Trimester. [facility] J Med [phone]. Electronically Signed   By: Narda Rutherford M.D.   On: 10/05/2019 15:50       Assessment and Plan:     Patient here for post op check - Patient has already returned to work - She is medically cleared to resume  all activities of daily living - Patient plans to conceive again. Patient advised to continue taking prenatal vitamins.      I discussed the assessment and treatment plan with the patient. The patient was provided an opportunity to ask questions and all were answered. The patient agreed with the plan and demonstrated an understanding of the instructions.   The patient was advised to call back or seek an in-person evaluation/go to the ED if the symptoms worsen or if the condition fails to improve as anticipated.  I provided 11 minutes of face-to-face time during this encounter.   Catalina Antigua, MD [facility] [employer]

## 2019-11-02 NOTE — Progress Notes (Signed)
Pt states she is doing well. Pt is no longer bleeding.

## 2019-11-14 ENCOUNTER — Other Ambulatory Visit: Payer: Self-pay

## 2019-11-14 ENCOUNTER — Ambulatory Visit (INDEPENDENT_AMBULATORY_CARE_PROVIDER_SITE_OTHER): Payer: Medicaid Other | Admitting: Internal Medicine

## 2019-11-14 ENCOUNTER — Encounter: Payer: Self-pay | Admitting: Internal Medicine

## 2019-11-14 VITALS — BP 122/80 | HR 70 | Resp 12 | Ht 63.0 in | Wt 192.0 lb

## 2019-11-14 DIAGNOSIS — E0590 Thyrotoxicosis, unspecified without thyrotoxic crisis or storm: Secondary | ICD-10-CM

## 2019-11-14 DIAGNOSIS — E119 Type 2 diabetes mellitus without complications: Secondary | ICD-10-CM | POA: Diagnosis not present

## 2019-11-14 DIAGNOSIS — E785 Hyperlipidemia, unspecified: Secondary | ICD-10-CM

## 2019-11-14 NOTE — Progress Notes (Signed)
    Subjective:    Patient ID: [name], female   DOB: 05/11/1981, 39 y.o.   MRN: [id number]   HPI   1.  Lost her pregnancy back in March.  She is not using birth control and is not interested.  Not actively trying to conceive.   Continues PNV as per OB/Gyn.  2.  Hyperthyroidism/thyroid nodule:  She was taking Methimazole, but her TSH/free T4 levels have been fine without it since February.  Dr. Buddy Duty saw her since on 4/20 or so and plans are to just follow up with labs in 3 months.  His chart note is not yet scanned in her chart to confirm dates.  [facility] apparently never ran the antibody testing ordered in March  Not clear what happened.    3.  DM:  Sugars in morning before breakfast running 150 range.  Not checking any other time.  She is no longer having episodes of shakiness.  Shakiness stopped after SAB, not from any change in diet.  She is willing to check in the afternoon before dinner a couple times of week.   Has not had an eye exam this year.   Has been to Dr. Katy Fitch.    4.  Dyslipidemia:  Low HDL and a bit high LDL:  Starting to walk during her break at work.    Current Meds  Medication Sig  . blood glucose meter kit and supplies KIT Check sugars twice daily before meals.  . Cholecalciferol (VITAMIN D3 PO) Take one pill daily, 1000 IU  . gabapentin (NEURONTIN) 300 MG capsule Take 300 mg by mouth 2 (two) times daily.  Marland Kitchen glucose blood test strip Use as instructed  . metFORMIN (GLUCOPHAGE) 500 MG tablet Take 1 tablet (500 mg total) by mouth 2 (two) times daily with a meal.  . Prenatal Vit-Fe Fumarate-FA (PREPLUS) 27-1 MG TABS Take 1 tablet by mouth daily.  . [DISCONTINUED] gabapentin (NEURONTIN) 100 MG capsule 2 caps by mouth at bedtime for 1 week, then 1 cap by mouth at bedtime for another week, then stop   No Known Allergies   Review of Systems    Objective:   BP 122/80 (BP Location: Left Arm, Patient Position: Sitting, Cuff Size: Large)   Pulse 70   Resp 12    Ht 5' 3" (1.6 m)   Wt 192 lb (87.1 kg)   LMP 11/05/2019   Breastfeeding No   BMI 34.01 kg/m   Physical Exam  NAD HEENT:  PERRl, EOMI,  Neck:  Supple, No adenopathy, enlarged, nodular thyroid Chest:  CTA CV:  RRR with SEM unchanged.  Radial and DP pulses normal and equal Abd:  S, NT, No HSM or mass, + BS LE:  No edema.    Diabetic foot exam was performed with the following findings:   No deformities, ulcerations, or other skin breakdown Normal sensation of 10g monofilament Intact posterior tibialis and dorsalis pedis pulses      Assessment & Plan   1.  SAB.  Doing okay.  To let us know if depression or other related concern develops.  2.  Thyroid nodule with hyperthyroidism:  As per Dr. Buddy Duty.  Checking on antibody testing ordered and why not performed.  3.  DM/Dyslipidemia:  Fasting labs in mid August:  A1C and FLP, to follow up 2 days later for CPE.  Referral for diabetic eye exam.  4.  HM:  Completed COVID vaccination in February.

## 2019-12-05 ENCOUNTER — Telehealth: Payer: Self-pay | Admitting: *Deleted

## 2019-12-05 ENCOUNTER — Telehealth: Payer: Self-pay | Admitting: Internal Medicine

## 2019-12-05 ENCOUNTER — Emergency Department (HOSPITAL_COMMUNITY)
Admission: EM | Admit: 2019-12-05 | Discharge: 2019-12-05 | Disposition: A | Discharge status: Home / Self Care | Payer: BC Managed Care – PPO | Attending: Emergency Medicine | Admitting: Emergency Medicine

## 2019-12-05 ENCOUNTER — Other Ambulatory Visit: Payer: Self-pay

## 2019-12-05 ENCOUNTER — Encounter (HOSPITAL_COMMUNITY): Payer: Self-pay

## 2019-12-05 DIAGNOSIS — Z20822 Contact with and (suspected) exposure to covid-19: Secondary | ICD-10-CM | POA: Insufficient documentation

## 2019-12-05 DIAGNOSIS — R9431 Abnormal electrocardiogram [ECG] [EKG]: Secondary | ICD-10-CM | POA: Diagnosis not present

## 2019-12-05 DIAGNOSIS — R0981 Nasal congestion: Secondary | ICD-10-CM | POA: Insufficient documentation

## 2019-12-05 DIAGNOSIS — E039 Hypothyroidism, unspecified: Secondary | ICD-10-CM | POA: Diagnosis not present

## 2019-12-05 DIAGNOSIS — R519 Headache, unspecified: Secondary | ICD-10-CM | POA: Diagnosis not present

## 2019-12-05 DIAGNOSIS — E119 Type 2 diabetes mellitus without complications: Secondary | ICD-10-CM | POA: Diagnosis not present

## 2019-12-05 DIAGNOSIS — J0100 Acute maxillary sinusitis, unspecified: Secondary | ICD-10-CM | POA: Diagnosis not present

## 2019-12-05 DIAGNOSIS — Z7984 Long term (current) use of oral hypoglycemic drugs: Secondary | ICD-10-CM | POA: Insufficient documentation

## 2019-12-05 LAB — SARS CORONAVIRUS 2 (TAT 6-24 HRS): SARS Coronavirus 2: NEGATIVE

## 2019-12-05 MED ORDER — AMOXICILLIN 500 MG PO CAPS: 500.0000 mg | ORAL_CAPSULE | Freq: Three times a day (TID) | ORAL | 0 refills | Status: AC

## 2019-12-05 MED ORDER — AFRIN NASAL SPRAY 0.05 % NA SOLN: 1.0000 | Freq: Two times a day (BID) | NASAL | 0 refills | Status: DC

## 2019-12-05 MED ORDER — SALINE SPRAY 0.65 % NA SOLN: 1.0000 | NASAL | 0 refills | Status: DC | PRN

## 2019-12-05 MED ORDER — DM-GUAIFENESIN ER 30-600 MG PO TB12: 1.0000 | ORAL_TABLET | Freq: Two times a day (BID) | ORAL | 0 refills | Status: DC | PRN

## 2019-12-05 NOTE — Discharge Instructions (Signed)
Please take all of your antibiotics until finished!   Take your antibiotics with food.  Common side effects of antibiotics include nausea, vomiting, abdominal discomfort, and diarrhea. You may help offset some of this with probiotics which you can buy or get in yogurt. Do not eat  or take the probiotics until 2 hours after your antibiotic.    Some studies suggest that certain antibiotics can reduce the efficacy of certain oral contraceptive pills (birth control), so please use additional contraceptives (condoms or other barrier method) while you are taking the antibiotics and for an additional 5 to 7 days afterwards if you are a female on these medications.  You can use the Afrin twice daily as needed for nasal congestion for up to 3 days at a time only.  Do not use for longer than 3 days because this medication can cause dependence and when you stop using it it can cause worsening nasal congestion.  You can use the nasal saline spray as well to help with the nasal congestion and blow out your nose with a tissue after using it.  Can also use steam showers, humidifier, warm tea with honey.  Use the dextromethorphan-guaifenesin for congestion.  Make sure to take with a full glass of water.  Your Covid test will result within 24 hours.  You will receive a phone call if the test is positive, no phone call if the test is negative.  If the test is positive you will need to quarantine for at least 10 days from when your symptoms began and you cannot return to work until after the 10-day.  But only if your symptoms are improving and you have been fever free for 24 hours.  Follow-up with your primary care provider for reevaluation of your symptoms.  Return to the emergency department if any concerning signs or symptoms develop such as persistent fevers, vision changes, difficulty breathing or swallowing, neck stiffness.

## 2019-12-05 NOTE — ED Triage Notes (Signed)
Pt c.o headache, ear pain, nasal congestion for 1 week, denies cough, fever, chest pain or SOB. Pt a.o, resp e.u

## 2019-12-05 NOTE — Telephone Encounter (Signed)
Patient called this morning stating was at the parking lot requesting to be seen today. Patient stated is feeling really bad with headache, dizziness, running nose and fatigue. Patient stated symptoms started a week ago but last night started to get worse. Shared information with the nurse who advised patient to go to [facility] and get tested for COVID 19 and to take  allergy medication like claritine or allegra; also that we are really booked today with no openings at all. Patient asked if that is ok to go to Emergency Room because is not feeling good for real. Per Nurse is ok to go to ER. Patient verbalized understanding.  Advised patient to call the clinic to let us know how everything is going.

## 2019-12-05 NOTE — ED Provider Notes (Signed)
[facility] EMERGENCY DEPARTMENT Provider Note   CSN: [id number] Arrival date & time: 12/05/19  0844     History Chief Complaint  Patient presents with  . Headache  . Nasal Congestion    [name] is a 39 y.o. female with history of dyslipidemia, hypothyroidism presents for evaluation of acute onset, persistent and progressively worsening nasal congestion, sinus pressure for just over 1 week.  Reports that her 74-year-old daughter had similar symptoms but is better now.  Denies fevers, cough, sore throat, shortness of breath or chest pain.  She reports bilateral sinus pressure that radiates up to the forehead, worse on the left than the right.  Pain will also radiate to the ears and she will feel ear pressure.  She has been trying Tylenol and Robitussin at night with some improvement.  She has had no known Covid exposures.  The history is provided by the patient.       Past Medical History:  Diagnosis Date  . Biological false positive RPR test 08/23/2016   FTA-ABS negative  . Diabetes mellitus without complication ([place]) 1761  . Dyslipidemia 04/27/2017  . Gestational diabetes   . Hyperthyroidism   . Positive PPD 2016   + PPD :  history of BCG as well.  Was unable to treat for latent when pregnant.  Sent for CXR 12.2017, which was negative for active TB.  To go to PHD for treatment of latent.  07/2016 documented  . Thyroid nodule 02/05/2017   Thyroid nodule with FNA with atypia of unknown significance and hyperthyroidism (Grave's Disease).  Was seen by Dr. Louanna Raw Endocrine.  Apparently deciding whether to undergo thyroidectomy vs medication  (methimazole or RAI treatment per patient.) She elected to take Methimazole.  . Visual acuity reduced 2008   Wears glasses only for nighttime driving    Patient Active Problem List   Diagnosis Date Noted  . Dyslipidemia 04/27/2017  . Thyroid nodule 02/05/2017  . Cervical lesion 02/05/2017  . Biological false  positive RPR test 08/23/2016  . Hyperthyroidism 04/06/2016  . Heart murmur, systolic 60/73/7106  . Hyperlipidemia 02/03/2016  . Controlled type 2 diabetes mellitus without complication, without long-term current use of insulin ([place]) 06/26/2015  . Arm pain 08/06/2014  . Positive PPD 07/13/2014  . Complex regional pain syndrome 05/16/2014  . Radial nerve disease 03/12/2014    Past Surgical History:  Procedure Laterality Date  . CESAREAN SECTION N/A 03/04/2015   Procedure: CESAREAN SECTION;  Surgeon: Frederico Hamman, MD;  Location: [facility];  Service: Obstetrics;  Laterality: N/A;  . DILATION AND EVACUATION N/A 10/18/2019   Procedure: DILATATION AND EVACUATION;  Surgeon: Mora Bellman, MD;  Location: [facility];  Service: Gynecology;  Laterality: N/A;  . FNA thyroid nodule  06/22/2018   path equivocal--cells of unknown signficance.  Followed by [facility] and being treated with Methimazole for hyperthryroidism.     OB History    Gravida  7   Para  5   Term  5   Preterm      AB  1   Living  1     SAB  1   TAB      Ectopic      Multiple  0   Live Births  1           Family History  Problem Relation Age of Onset  . Stroke Paternal Grandmother     Social History   Tobacco Use  . Smoking status:  Never Smoker  . Smokeless tobacco: Never Used  Substance Use Topics  . Alcohol use: No    Alcohol/week: 0.0 standard drinks  . Drug use: No    Home Medications Prior to Admission medications   Medication Sig Start Date End Date Taking? Authorizing Provider  amoxicillin (AMOXIL) 500 MG capsule Take 1 capsule (500 mg total) by mouth 3 (three) times daily for 7 days. 12/05/19 12/12/19  Rodell Perna A, PA-C  blood glucose meter kit and supplies KIT Check sugars twice daily before meals. 04/27/17   Mack Hook, MD  Cholecalciferol (VITAMIN D3 PO) Take one pill daily, 1000 IU    [provider]  dextromethorphan-guaiFENesin (MUCINEX  DM) 30-600 MG 12hr tablet Take 1 tablet by mouth 2 (two) times daily as needed (congestion). 12/05/19   Edia Pursifull A, PA-C  gabapentin (NEURONTIN) 300 MG capsule Take 300 mg by mouth 2 (two) times daily. 10/29/19   [provider]  glucose blood test strip Use as instructed 04/27/17   Mack Hook, MD  HYDROcodone-Acetaminophen 5-300 MG TABS Take 1 tablet by mouth every 4 (four) hours as needed. Patient not taking: Reported on 11/14/2019 10/18/19   Constant, Peggy, MD  ibuprofen (ADVIL) 600 MG tablet Take 1 tablet (600 mg total) by mouth every 6 (six) hours as needed. Patient not taking: Reported on 11/14/2019 10/18/19   Constant, Peggy, MD  metFORMIN (GLUCOPHAGE) 500 MG tablet Take 1 tablet (500 mg total) by mouth 2 (two) times daily with a meal. 04/21/19   Mack Hook, MD  oxymetazoline (AFRIN NASAL SPRAY) 0.05 % nasal spray Place 1 spray into both nostrils 2 (two) times daily. 12/05/19   Elanda Garmany A, PA-C  Prenatal Vit-Fe Fumarate-FA (PREPLUS) 27-1 MG TABS Take 1 tablet by mouth daily. 11/09/19   [provider]  sodium chloride (OCEAN) 0.65 % SOLN nasal spray Place 1 spray into both nostrils as needed for congestion. 12/05/19   Rodell Perna A, PA-C    Allergies    Patient has no known allergies.  Review of Systems   Review of Systems  Constitutional: Negative for chills and fever.  HENT: Positive for congestion, ear pain, sinus pressure and sinus pain. Negative for ear discharge, hearing loss, sore throat and trouble swallowing.   Respiratory: Negative for cough and shortness of breath.   Cardiovascular: Negative for chest pain.  Neurological: Positive for headaches.    Physical Exam Updated Vital Signs BP (!) 145/85 (BP Location: Left Arm)   Pulse 91   Temp 98.4 F (36.9 C) (Oral)   Resp 16   LMP 11/02/2019   SpO2 100%   Physical Exam Vitals and nursing note reviewed.  Constitutional:      General: She is not in acute distress.    Appearance: She is  well-developed.     Comments: Resting comfortably in bed  HENT:     Head: Normocephalic and atraumatic.     Comments: Sounds audibly congested.  Mucosal edema noted bilaterally, left worse than right.  There is left maxillary sinus tenderness to percussion.  Middle ear effusions noted bilaterally with no erythema or bulging.  Tolerating secretions without difficulty.  Posterior pharynx with postnasal drip noted but no erythema, tonsillar hypertrophy, exudates, trismus, sublingual abnormalities, or uvular deviation. Eyes:     General:        Right eye: No discharge.        Left eye: No discharge.     Conjunctiva/sclera: Conjunctivae normal.  Neck:  Vascular: No JVD.     Trachea: No tracheal deviation.     Comments: Left anterior cervical lymphadenopathy. Cardiovascular:     Rate and Rhythm: Normal rate and regular rhythm.     Heart sounds: Normal heart sounds.  Pulmonary:     Effort: Pulmonary effort is normal.     Breath sounds: Normal breath sounds. No wheezing.     Comments: Speaking in full sentences without difficulty, SPO2 saturations 100% on room air Abdominal:     General: There is no distension.  Musculoskeletal:     Cervical back: Normal range of motion and neck supple. No rigidity.  Lymphadenopathy:     Cervical: Cervical adenopathy present.  Skin:    Findings: No erythema.  Neurological:     Mental Status: She is alert.  Psychiatric:        Behavior: Behavior normal.     ED Results / Procedures / Treatments   Labs (all labs ordered are listed, but only abnormal results are displayed) Labs Reviewed  SARS CORONAVIRUS 2 (TAT 6-24 HRS)    EKG None  Radiology No results found.  Procedures Procedures (including critical care time)  Medications Ordered in ED Medications - No data to display  ED Course  I have reviewed the triage vital signs and the nursing notes.  Pertinent labs & imaging results that were available during my care of the patient were  reviewed by me and considered in my medical decision making (see chart for details).    MDM Rules/Calculators/A&P                      [name] was evaluated in Emergency Department on 12/05/2019 for the symptoms described in the history of present illness. She was evaluated in the context of the global COVID-19 pandemic, which necessitated consideration that the patient might be at risk for infection with the SARS-CoV-2 virus that causes COVID-19. Institutional protocols and algorithms that pertain to the evaluation of patients at risk for COVID-19 are in a state of rapid change based on information released by regulatory bodies including the CDC and federal and state organizations. These policies and algorithms were followed during the patient's care in the ED.  Patient complaining of symptoms of sinusitis.she is afebrile, vital signs are stable.  She is nontoxic in appearance.  Given duration and severity of symptoms, concern for acute bacterial rhinosinusitis.  Doubt meningitis, pneumonia, or strep pharyngitis.  Will discharge with course of amoxicillin and discussed symptomatic management.  We will also obtain outpatient Covid test, discussed that if results are positive she will need to quarantine at home per current CDC guidelines.  Recommend follow-up with PCP for reevaluation of symptoms.  Discussed strict ED return precautions. Patient verbalized understanding of and agreement with plan and is safe for discharge home at this time.   Final Clinical Impression(s) / ED Diagnoses Final diagnoses:  Acute non-recurrent maxillary sinusitis    Rx / DC Orders ED Discharge Orders         Ordered    amoxicillin (AMOXIL) 500 MG capsule  3 times daily     12/05/19 1135    oxymetazoline (AFRIN NASAL SPRAY) 0.05 % nasal spray  2 times daily     12/05/19 1135    sodium chloride (OCEAN) 0.65 % SOLN nasal spray  As needed     12/05/19 1135    dextromethorphan-guaiFENesin (MUCINEX DM) 30-600 MG  12hr tablet  2 times daily PRN  12/05/19 Watson, Janely Gullickson A, PA-C 12/05/19 1139    Daleen Bo, MD 12/05/19 1143

## 2019-12-05 NOTE — Telephone Encounter (Signed)
Pharmacy called regarding not receiving escrpit for amoxilciin.   RNCM read Rx off as written to Pharm D.

## 2020-02-01 ENCOUNTER — Other Ambulatory Visit: Payer: Self-pay | Admitting: Internal Medicine

## 2020-02-01 DIAGNOSIS — E0500 Thyrotoxicosis with diffuse goiter without thyrotoxic crisis or storm: Secondary | ICD-10-CM | POA: Diagnosis not present

## 2020-02-01 DIAGNOSIS — R011 Cardiac murmur, unspecified: Secondary | ICD-10-CM | POA: Diagnosis not present

## 2020-02-01 DIAGNOSIS — E042 Nontoxic multinodular goiter: Secondary | ICD-10-CM | POA: Diagnosis not present

## 2020-02-08 ENCOUNTER — Ambulatory Visit
Admission: RE | Admit: 2020-02-08 | Discharge: 2020-02-08 | Disposition: A | Discharge status: Home / Self Care | Payer: BC Managed Care – PPO | Source: Ambulatory Visit | Attending: Internal Medicine | Admitting: Internal Medicine

## 2020-02-08 DIAGNOSIS — E0500 Thyrotoxicosis with diffuse goiter without thyrotoxic crisis or storm: Secondary | ICD-10-CM

## 2020-02-08 DIAGNOSIS — E041 Nontoxic single thyroid nodule: Secondary | ICD-10-CM | POA: Diagnosis not present

## 2020-03-19 ENCOUNTER — Other Ambulatory Visit (INDEPENDENT_AMBULATORY_CARE_PROVIDER_SITE_OTHER): Payer: Medicaid Other

## 2020-03-19 DIAGNOSIS — E119 Type 2 diabetes mellitus without complications: Secondary | ICD-10-CM | POA: Diagnosis not present

## 2020-03-19 DIAGNOSIS — E785 Hyperlipidemia, unspecified: Secondary | ICD-10-CM

## 2020-03-20 DIAGNOSIS — E119 Type 2 diabetes mellitus without complications: Secondary | ICD-10-CM | POA: Diagnosis not present

## 2020-03-20 DIAGNOSIS — H0102A Squamous blepharitis right eye, upper and lower eyelids: Secondary | ICD-10-CM | POA: Diagnosis not present

## 2020-03-20 DIAGNOSIS — H2513 Age-related nuclear cataract, bilateral: Secondary | ICD-10-CM | POA: Diagnosis not present

## 2020-03-20 DIAGNOSIS — H0102B Squamous blepharitis left eye, upper and lower eyelids: Secondary | ICD-10-CM | POA: Diagnosis not present

## 2020-03-20 LAB — HGB A1C W/O EAG: Hgb A1c MFr Bld: 6.2 % — ABNORMAL HIGH (ref 4.8–5.6)

## 2020-03-20 LAB — LIPID PANEL W/O CHOL/HDL RATIO
Cholesterol, Total: 170 mg/dL (ref 100–199)
HDL: 51 mg/dL (ref >39)
LDL Chol Calc (NIH): 109 mg/dL — ABNORMAL HIGH (ref 0–99)
Triglycerides: 51 mg/dL (ref 0–149)
VLDL Cholesterol Cal: 10 mg/dL (ref 5–40)

## 2020-03-22 ENCOUNTER — Encounter: Payer: Medicaid Other | Admitting: Internal Medicine

## 2020-03-27 ENCOUNTER — Other Ambulatory Visit: Payer: Self-pay | Admitting: Internal Medicine

## 2020-03-27 DIAGNOSIS — E119 Type 2 diabetes mellitus without complications: Secondary | ICD-10-CM

## 2020-04-03 ENCOUNTER — Other Ambulatory Visit (INDEPENDENT_AMBULATORY_CARE_PROVIDER_SITE_OTHER): Payer: Medicaid Other

## 2020-04-03 DIAGNOSIS — R300 Dysuria: Secondary | ICD-10-CM | POA: Diagnosis not present

## 2020-04-03 LAB — POCT URINALYSIS DIPSTICK
Bilirubin, UA: NEGATIVE
Glucose, UA: NEGATIVE
Ketones, UA: NEGATIVE
Nitrite, UA: NEGATIVE
Protein, UA: POSITIVE — AB
Spec Grav, UA: 1.01 (ref 1.010–1.025)
Urobilinogen, UA: 0.2 E.U./dL
pH, UA: 8.5 — AB (ref 5.0–8.0)

## 2020-04-03 MED ORDER — NITROFURANTOIN MONOHYD MACRO 100 MG PO CAPS
100.0000 mg | ORAL_CAPSULE | Freq: Two times a day (BID) | ORAL | 0 refills | Status: AC
Start: 2020-04-03 — End: 2020-04-06

## 2020-04-03 MED ORDER — PHENAZOPYRIDINE HCL 200 MG PO TABS
ORAL_TABLET | ORAL | 0 refills | Status: DC
Start: 2020-04-03 — End: 2020-06-18

## 2020-04-06 LAB — URINE CULTURE

## 2020-04-27 ENCOUNTER — Other Ambulatory Visit: Payer: Self-pay | Admitting: Internal Medicine

## 2020-05-13 ENCOUNTER — Encounter: Payer: Medicaid Other | Admitting: Internal Medicine

## 2020-06-18 ENCOUNTER — Other Ambulatory Visit: Payer: Self-pay | Admitting: Internal Medicine

## 2020-06-18 ENCOUNTER — Ambulatory Visit (INDEPENDENT_AMBULATORY_CARE_PROVIDER_SITE_OTHER): Payer: Medicaid Other | Admitting: Internal Medicine

## 2020-06-18 ENCOUNTER — Other Ambulatory Visit: Payer: Self-pay

## 2020-06-18 ENCOUNTER — Encounter: Payer: Self-pay | Admitting: Internal Medicine

## 2020-06-18 VITALS — BP 128/74 | HR 84 | Resp 12 | Ht 63.0 in | Wt 201.5 lb

## 2020-06-18 DIAGNOSIS — E119 Type 2 diabetes mellitus without complications: Secondary | ICD-10-CM

## 2020-06-18 DIAGNOSIS — Z0000 Encounter for general adult medical examination without abnormal findings: Secondary | ICD-10-CM | POA: Diagnosis not present

## 2020-06-18 DIAGNOSIS — E041 Nontoxic single thyroid nodule: Secondary | ICD-10-CM

## 2020-06-18 DIAGNOSIS — E0590 Thyrotoxicosis, unspecified without thyrotoxic crisis or storm: Secondary | ICD-10-CM

## 2020-06-18 DIAGNOSIS — E785 Hyperlipidemia, unspecified: Secondary | ICD-10-CM

## 2020-06-18 DIAGNOSIS — R195 Other fecal abnormalities: Secondary | ICD-10-CM | POA: Diagnosis not present

## 2020-06-18 DIAGNOSIS — Z124 Encounter for screening for malignant neoplasm of cervix: Secondary | ICD-10-CM

## 2020-06-18 NOTE — Progress Notes (Signed)
Subjective:    Patient ID: [name], female   DOB: 06/12/1981, 39 y.o.   MRN: [id number]   HPI   CPE with pap  1.  Pap:  Last pap 07/2016 and normal.    2.  Mammogram:  Normal mammogram 04/2017 and normal.  No family history of breast cancer.  She is contemplating setting up mammogram this year.  Has not made a decision yet, but discussed lower risk with her.  3.  Osteoprevention:  Only drinks one cup of whole milk daily.  Walks daily and is also with work.  Walks at her lunchtime.    4.  Guaiac Cards:  Positive in 03/28/2019 and sent for diagnostic colonoscopy.  5.  Colonoscopy:  Had colonoscopy 05/18/2019 with significantly suboptimal prep and could not complete.  She was to reappoint, but did not happen.  Not clear if she is motivated as does not want to go so long without eating.  No family history of colon cancer.   6.  Immunizations:  Has not had Moderna booster.  Has not had influenza vaccine.  Had opportunity to obtain at work.   Immunization History  Administered Date(s) Administered  . Influenza Inj Mdck Quad Pf 04/27/2017  . Influenza-Unspecified 07/03/2015, 06/07/2018  . Moderna SARS-COVID-2 Vaccination 07/25/2019, 08/24/2019  . Pneumococcal Polysaccharide-23 04/06/2016  . Tdap 03/06/2015     7.  Glucose/Cholesterol:  Last A1C was 6.2% in September.  LDL not at goal in September.  Current Meds  Medication Sig  . blood glucose meter kit and supplies KIT Check sugars twice daily before meals.  . Cholecalciferol (VITAMIN D3 PO) Take one pill daily, 1000 IU  . gabapentin (NEURONTIN) 300 MG capsule Take 1 capsule by mouth twice daily  . glucose blood test strip Use as instructed  . metFORMIN (GLUCOPHAGE) 500 MG tablet TAKE 1 TABLET BY MOUTH TWICE DAILY WITH A MEAL  . Prenatal Vit-Fe Fumarate-FA (PREPLUS) 27-1 MG TABS Take 1 tablet by mouth daily.   No Known Allergies   Past Medical History:  Diagnosis Date  . Biological false positive RPR test 08/23/2016    FTA-ABS negative  . Diabetes mellitus without complication ([place]) 8757  . Dyslipidemia 04/27/2017  . Gestational diabetes   . Hyperthyroidism   . Positive PPD 2016   + PPD :  history of BCG as well.  Was unable to treat for latent when pregnant.  Sent for CXR 12.2017, which was negative for active TB.  To go to PHD for treatment of latent.  07/2016 documented  . Thyroid nodule 02/05/2017   Thyroid nodule with FNA with atypia of unknown significance and hyperthyroidism (Grave's Disease).  Was seen by Dr. Louanna Raw Endocrine.  Apparently deciding whether to undergo thyroidectomy vs medication  (methimazole or RAI treatment per patient.) She elected to take Methimazole.  Stopped with pregnancy in 10/2019 and thyroid testing okay  . Visual acuity reduced 2008   Wears glasses only for nighttime driving    Past Surgical History:  Procedure Laterality Date  . CESAREAN SECTION N/A 03/04/2015   Procedure: CESAREAN SECTION;  Surgeon: Frederico Hamman, MD;  Location: [facility];  Service: Obstetrics;  Laterality: N/A;  . DILATION AND EVACUATION N/A 10/18/2019   Procedure: DILATATION AND EVACUATION;  Surgeon: Mora Bellman, MD;  Location: [facility];  Service: Gynecology;  Laterality: N/A;  . FNA thyroid nodule  06/22/2018   path equivocal--cells of unknown signficance.  Followed by [facility] and being treated with Methimazole for  hyperthryroidism.    Family History  Problem Relation Age of Onset  . Stroke Paternal Grandmother     Social History   Socioeconomic History  . Marital status: Married    Spouse name: Boubacar-1st husband die  . Number of children: 5  . Years of education: 6  . Highest education level: Not on file  Occupational History  . Occupation: [employer]  Tobacco Use  . Smoking status: Never Smoker  . Smokeless tobacco: Never Used  Vaping Use  . Vaping Use: Never used  Substance and Sexual Activity  . Alcohol use: No    Alcohol/week: 0.0  standard drinks  . Drug use: No  . Sexual activity: Yes    Partners: Male    Birth control/protection: None  Other Topics Concern  . Not on file  Social History Narrative   Widowed in 2004 when husband killed in [place].   Originally from [place]   Came to [place]. In 2001     Her first 2 children are with her first husband, who came with her to [place]. And was killed in MVA here.   Middle child with a second man she married and divorced after 5 years.     Younger two children with her current husband   Her oldest daughter is married (10/2015) in [place]--grew up in maternal grandmother's home (has not been with this daughter since her daughter was 51 yo)   Lives with third husband and 4 of her children.        Social Determinants of Health   Financial Resource Strain: Low Risk   . Difficulty of Paying Living Expenses: Not hard at all  Food Insecurity: No Food Insecurity  . Worried About Charity fundraiser in the Last Year: Never true  . Ran Out of Food in the Last Year: Never true  Transportation Needs:   . Lack of Transportation (Medical): Not on file  . Lack of Transportation (Non-Medical): Not on file  Physical Activity:   . Days of Exercise per Week: Not on file  . Minutes of Exercise per Session: Not on file  Stress:   . Feeling of Stress : Not on file  Social Connections:   . Frequency of Communication with Friends and Family: Not on file  . Frequency of Social Gatherings with Friends and Family: Not on file  . Attends Religious Services: Not on file  . Active Member of Clubs or Organizations: Not on file  . Attends Archivist Meetings: Not on file  . Marital Status: Not on file  Intimate Partner Violence: Not At Risk  . Fear of Current or Ex-Partner: No  . Emotionally Abused: No  . Physically Abused: No  . Sexually Abused: No      Review of Systems  Eyes: Negative for visual disturbance (Had check with Dr. Katy Fitch.  No diabetic changes.).  Skin:       Checks feet  nightly.    Psychiatric/Behavioral: Positive for sleep disturbance.       Problems sleeping.  Tosses and turns in bed.  Otherwise good sleep hygiene.        Objective:   BP 128/74 (BP Location: Left Arm, Patient Position: Sitting, Cuff Size: Large)   Pulse 84   Resp 12   Ht 5' 3"  (1.6 m)   Wt 201 lb 8 oz (91.4 kg)   LMP 06/05/2020 (Exact Date)   BMI 35.69 kg/m   Physical Exam Constitutional:  Appearance: She is obese.  HENT:     Head: Normocephalic and atraumatic.     Right Ear: Tympanic membrane, ear canal and external ear normal.     Left Ear: Tympanic membrane, ear canal and external ear normal.     Nose: Nose normal.     Mouth/Throat:     Mouth: Mucous membranes are moist.     Pharynx: Oropharynx is clear. Uvula midline.     Comments: Dentures Eyes:     Extraocular Movements: Extraocular movements intact.     Conjunctiva/sclera: Conjunctivae normal.     Pupils: Pupils are equal, round, and reactive to light.  Neck:     Thyroid: Thyromegaly present.  Cardiovascular:     Rate and Rhythm: Normal rate and regular rhythm.     Heart sounds: S1 normal and S2 normal. Murmur heard.  Systolic murmur is present with a grade of 3/6.  No friction rub. No S3 or S4 sounds.      Comments: No carotid bruits.  Carotid, radial, femoral, DP and PT pulses normal and equal.  Murmur extends into carotids and heard all over precordium Pulmonary:     Effort: Pulmonary effort is normal.     Breath sounds: Normal breath sounds.  Chest:     Breasts:        Right: No bleeding, inverted nipple, mass, nipple discharge or skin change.        Left: No bleeding, inverted nipple, mass, nipple discharge or skin change.  Abdominal:     Palpations: Abdomen is soft. There is no hepatomegaly, splenomegaly or mass.     Tenderness: There is no abdominal tenderness.     Hernia: No hernia is present.  Genitourinary:    Comments: Normal external female genitalia. No cervical or vaginal lesion No  uterine or adnexal mass or tenderness. Musculoskeletal:        General: Normal range of motion.     Cervical back: Normal range of motion and neck supple.     Right lower leg: No edema.     Left lower leg: No edema.  Feet:     Comments: Diabetic foot exam was performed with the following findings:   No deformities, ulcerations, or other skin breakdown Normal sensation of 10g monofilament Intact posterior tibialis and dorsalis pedis pulses    Lymphadenopathy:     Head:     Right side of head: No submental or submandibular adenopathy.     Left side of head: No submental or submandibular adenopathy.     Cervical: No cervical adenopathy.     Upper Body:     Right upper body: No supraclavicular or axillary adenopathy.     Left upper body: No supraclavicular or axillary adenopathy.     Lower Body: No right inguinal adenopathy. No left inguinal adenopathy.  Skin:    General: Skin is warm.     Capillary Refill: Capillary refill takes less than 2 seconds.     Findings: No rash.  Neurological:     Mental Status: She is alert and oriented to person, place, and time.     Cranial Nerves: Cranial nerves are intact.     Sensory: Sensation is intact.     Motor: Motor function is intact.     Coordination: Coordination is intact.     Gait: Gait is intact.     Deep Tendon Reflexes: Reflexes are normal and symmetric.  Psychiatric:        Attention and Perception: Attention and perception  normal.        Mood and Affect: Mood normal.        Speech: Speech normal.        Behavior: Behavior normal.        Thought Content: Thought content normal.        Cognition and Memory: Cognition normal.        Judgment: Judgment normal.      Assessment & Plan   1.  CPE with pap She will decide on mammogram She will obtain influenza vaccine at work as we are out currently Return on Monday for [employer] booster.   2. Hx of heme + stools and poor prep for colonoscopy.   Referral back to Dr. Loletha Carrow  to try again. Encouraged her to perform a good prep--perhaps ask for one that she tolerates better  3.  Hyperthyroidism:  Recheck labs in 3 months.  Reportedly with follow up with Dr. Buddy Duty in July 2022.  Labs were fine in March without Methimazole and believe she was seen in July with him as well.  4. DM:  Has been well controlled.  A1C with labs in 3 months.  5.  Elevated LDL:  Has been well controlled in past, but LDL gradually increasing.  Encouraged working on lifestyle changes.  Due to recent pregnancy and no birth control, hold on statin for now.

## 2020-06-18 NOTE — Patient Instructions (Signed)

## 2020-06-19 LAB — CYTOLOGY - PAP

## 2020-07-15 ENCOUNTER — Telehealth: Payer: Self-pay | Admitting: Internal Medicine

## 2020-07-15 NOTE — Telephone Encounter (Signed)
Please work her in as an acute

## 2020-07-15 NOTE — Telephone Encounter (Signed)
Patient called requesting an appointment. Patient stated that she is ben having cramping in her abdomen since Saturday. It started with a very sharp pain in her lower abdomen, and now comes and goes. No other symptoms. Patient took tylenol but does not help a lot. Please advise.

## 2020-07-16 NOTE — Telephone Encounter (Signed)
Spoke with patient and she stated that no longer need the appointment because she is feeling better. Patient will call back if her symptoms come back.

## 2020-08-16 ENCOUNTER — Telehealth: Payer: Self-pay | Admitting: Internal Medicine

## 2020-08-16 NOTE — Telephone Encounter (Signed)
Patient called asking if you can recommend any medicine that can help her with headache and does not have appetite. Patient stated that she's been having the headache for the past 3 days and is mainly in the front side. Patient took tylenol and it helpes for a couple of hours but then the pain continues.

## 2020-08-19 NOTE — Telephone Encounter (Signed)
Can alternate Tylenol with ibuprofen every 4 hours for the headache. If she continues with the headache--not improving, will need an appt.

## 2020-08-23 NOTE — Telephone Encounter (Signed)
Called patient and left a message with Doctor recommendations. I asked patient to call back if symptoms don't improve.

## 2020-09-02 NOTE — Telephone Encounter (Signed)
Spoke with patient and notified her of [facility] recommendations. Patient agreed to call back to schedule an appointment if symptoms don't improve.

## 2020-09-15 ENCOUNTER — Other Ambulatory Visit: Payer: Self-pay | Admitting: Internal Medicine

## 2020-09-16 ENCOUNTER — Other Ambulatory Visit: Payer: Medicaid Other | Admitting: Internal Medicine

## 2020-09-23 ENCOUNTER — Other Ambulatory Visit: Payer: Self-pay

## 2020-09-23 ENCOUNTER — Other Ambulatory Visit (INDEPENDENT_AMBULATORY_CARE_PROVIDER_SITE_OTHER): Payer: Medicaid Other | Admitting: Internal Medicine

## 2020-09-23 DIAGNOSIS — E0590 Thyrotoxicosis, unspecified without thyrotoxic crisis or storm: Secondary | ICD-10-CM | POA: Diagnosis not present

## 2020-09-23 DIAGNOSIS — E119 Type 2 diabetes mellitus without complications: Secondary | ICD-10-CM | POA: Diagnosis not present

## 2020-09-23 DIAGNOSIS — D649 Anemia, unspecified: Secondary | ICD-10-CM

## 2020-09-23 DIAGNOSIS — E785 Hyperlipidemia, unspecified: Secondary | ICD-10-CM

## 2020-09-23 DIAGNOSIS — Z79899 Other long term (current) drug therapy: Secondary | ICD-10-CM

## 2020-09-24 LAB — CBC WITH DIFFERENTIAL/PLATELET
Basophils Absolute: 0 10*3/uL (ref 0.0–0.2)
Basos: 1 %
EOS (ABSOLUTE): 0.1 10*3/uL (ref 0.0–0.4)
Eos: 2 %
Hematocrit: 38 % (ref 34.0–46.6)
Hemoglobin: 12.3 g/dL (ref 11.1–15.9)
Immature Grans (Abs): 0 10*3/uL (ref 0.0–0.1)
Immature Granulocytes: 0 %
Lymphocytes Absolute: 1.4 10*3/uL (ref 0.7–3.1)
Lymphs: 36 %
MCH: 28.5 pg (ref 26.6–33.0)
MCHC: 32.4 g/dL (ref 31.5–35.7)
MCV: 88 fL (ref 79–97)
Monocytes Absolute: 0.4 10*3/uL (ref 0.1–0.9)
Monocytes: 10 %
Neutrophils Absolute: 2 10*3/uL (ref 1.4–7.0)
Neutrophils: 51 %
Platelets: 231 10*3/uL (ref 150–450)
RBC: 4.32 x10E6/uL (ref 3.77–5.28)
RDW: 12.5 % (ref 11.7–15.4)
WBC: 4 10*3/uL (ref 3.4–10.8)

## 2020-09-24 LAB — TSH: TSH: 0.772 u[IU]/mL (ref 0.450–4.500)

## 2020-09-24 LAB — LIPID PANEL W/O CHOL/HDL RATIO
Cholesterol, Total: 173 mg/dL (ref 100–199)
HDL: 45 mg/dL (ref >39)
LDL Chol Calc (NIH): 114 mg/dL — ABNORMAL HIGH (ref 0–99)
Triglycerides: 76 mg/dL (ref 0–149)
VLDL Cholesterol Cal: 14 mg/dL (ref 5–40)

## 2020-09-24 LAB — COMPREHENSIVE METABOLIC PANEL
ALT: 12 IU/L (ref 0–32)
AST: 15 IU/L (ref 0–40)
Albumin/Globulin Ratio: 1.4 (ref 1.2–2.2)
Albumin: 4.2 g/dL (ref 3.8–4.8)
Alkaline Phosphatase: 51 IU/L (ref 44–121)
BUN/Creatinine Ratio: 11 (ref 9–23)
BUN: 5 mg/dL — ABNORMAL LOW (ref 6–24)
Bilirubin Total: 0.3 mg/dL (ref 0.0–1.2)
CO2: 19 mmol/L — ABNORMAL LOW (ref 20–29)
Calcium: 9.1 mg/dL (ref 8.7–10.2)
Chloride: 102 mmol/L (ref 96–106)
Creatinine, Ser: 0.46 mg/dL — ABNORMAL LOW (ref 0.57–1.00)
Globulin, Total: 2.9 g/dL (ref 1.5–4.5)
Glucose: 108 mg/dL — ABNORMAL HIGH (ref 65–99)
Potassium: 4 mmol/L (ref 3.5–5.2)
Sodium: 140 mmol/L (ref 134–144)
Total Protein: 7.1 g/dL (ref 6.0–8.5)
eGFR: 122 mL/min/{1.73_m2} (ref >59)

## 2020-09-24 LAB — MICROALBUMIN / CREATININE URINE RATIO
Creatinine, Urine: 47.5 mg/dL
Microalb/Creat Ratio: <6 mg/g creat (ref 0–29)
Microalbumin, Urine: <3 ug/mL

## 2020-09-24 LAB — T4, FREE: Free T4: 1.2 ng/dL (ref 0.82–1.77)

## 2020-09-24 LAB — HGB A1C W/O EAG: Hgb A1c MFr Bld: 6.3 % — ABNORMAL HIGH (ref 4.8–5.6)

## 2020-09-25 ENCOUNTER — Other Ambulatory Visit: Payer: Self-pay

## 2020-09-25 ENCOUNTER — Ambulatory Visit: Payer: Medicaid Other | Admitting: Internal Medicine

## 2020-09-25 ENCOUNTER — Encounter: Payer: Self-pay | Admitting: Internal Medicine

## 2020-09-25 VITALS — BP 128/79 | HR 66 | Resp 12 | Ht 63.0 in | Wt 199.0 lb

## 2020-09-25 DIAGNOSIS — E041 Nontoxic single thyroid nodule: Secondary | ICD-10-CM | POA: Diagnosis not present

## 2020-09-25 DIAGNOSIS — E119 Type 2 diabetes mellitus without complications: Secondary | ICD-10-CM | POA: Diagnosis not present

## 2020-09-25 DIAGNOSIS — E0590 Thyrotoxicosis, unspecified without thyrotoxic crisis or storm: Secondary | ICD-10-CM

## 2020-09-25 DIAGNOSIS — E785 Hyperlipidemia, unspecified: Secondary | ICD-10-CM

## 2020-09-25 NOTE — Progress Notes (Signed)
    Subjective:    Patient ID: [name], female   DOB: September 22, 1980, 40 y.o.   MRN: [id number]   HPI   1.  Dyslipidemia:  Still with regular periods and LDL now up to 114.  Does walk outside of work daily. Lipid Panel     Component Value Date/Time   CHOL 173 09/23/2020 0922   TRIG 76 09/23/2020 0922   HDL 45 09/23/2020 0922   LDLCALC 114 (H) 09/23/2020 0922   LABVLDL 14 09/23/2020 0922   2.  DM:  A1C recently 6.3%.  Has had eye check in past year.  Feels her next appt in September.   Did not get influenza vaccine this year at [facility]. Did get Covid booster there.    3.  Sleep issues:  Especially with period.  Gets a headache especially just before period starts and keeps her awake.  Has not tried a NSAID.   Current Meds  Medication Sig   blood glucose meter kit and supplies KIT Check sugars twice daily before meals.   Cholecalciferol (VITAMIN D3 PO) Take one pill daily, 1000 IU   gabapentin (NEURONTIN) 300 MG capsule Take 1 capsule by mouth twice daily   glucose blood test strip Use as instructed   metFORMIN (GLUCOPHAGE) 500 MG tablet TAKE 1 TABLET BY MOUTH TWICE DAILY WITH A MEAL   Prenatal 27-1 MG TABS Take 1 tablet by mouth daily   No Known Allergies   Review of Systems    Objective:   BP 128/79 (BP Location: Right Arm, Patient Position: Sitting, Cuff Size: Normal)   Pulse 66   Resp 12   Ht 5\' 3"  (1.6 m)   Wt 199 lb (90.3 kg)   LMP 08/31/2020 (Exact Date)   BMI 35.25 kg/m   Physical Exam NAD HEENT:  PERRL, EOMI, TMs pearly gray, throat without injection Neck;  Supple, No adenopaty.  Nodular thyromegaly Chest:  CTA CV:  RRR with Grade 3/6 SEM radiating to carotids.  Radial and DP pulses normal and equal.  Assessment & Plan    Elevated LDL:  SAB last year and does not want to use any form of birth control.  Holding on statin, which will likely last until menopausal.  2.  Multinodular goiter with history of hyperthyroidism:  was on Methimazole  until realized she was pregnant, so stopped.  Subsequent testing of thyroid have remained normal    3.  DM:  has been controlled.    4.  Sleep issues/headaches with periods:  encouraged trying ibuprofen 400-800 mg when has a headache with period.  To take with food.

## 2021-02-07 DIAGNOSIS — E042 Nontoxic multinodular goiter: Secondary | ICD-10-CM | POA: Diagnosis not present

## 2021-02-07 DIAGNOSIS — E0500 Thyrotoxicosis with diffuse goiter without thyrotoxic crisis or storm: Secondary | ICD-10-CM | POA: Diagnosis not present

## 2021-02-21 DIAGNOSIS — E042 Nontoxic multinodular goiter: Secondary | ICD-10-CM | POA: Diagnosis not present

## 2021-02-21 DIAGNOSIS — E0500 Thyrotoxicosis with diffuse goiter without thyrotoxic crisis or storm: Secondary | ICD-10-CM | POA: Diagnosis not present

## 2021-03-03 ENCOUNTER — Other Ambulatory Visit (INDEPENDENT_AMBULATORY_CARE_PROVIDER_SITE_OTHER): Payer: Medicaid Other

## 2021-03-03 DIAGNOSIS — U071 COVID-19: Secondary | ICD-10-CM | POA: Diagnosis not present

## 2021-03-03 DIAGNOSIS — Z1152 Encounter for screening for COVID-19: Secondary | ICD-10-CM | POA: Diagnosis not present

## 2021-03-03 LAB — POC COVID19 BINAXNOW: SARS Coronavirus 2 Ag: POSITIVE — AB

## 2021-03-03 MED ORDER — NIRMATRELVIR/RITONAVIR (PAXLOVID)TABLET: 3.0000 | ORAL_TABLET | Freq: Two times a day (BID) | ORAL | 0 refills | Status: AC

## 2021-03-03 NOTE — Progress Notes (Signed)
Discussed isolation and coming out of isolation. Rx for Paxlovid sent to [facility] to start immediately.

## 2021-03-09 ENCOUNTER — Other Ambulatory Visit: Payer: Self-pay | Admitting: Internal Medicine

## 2021-03-09 DIAGNOSIS — E119 Type 2 diabetes mellitus without complications: Secondary | ICD-10-CM

## 2021-03-24 DIAGNOSIS — H0102B Squamous blepharitis left eye, upper and lower eyelids: Secondary | ICD-10-CM | POA: Diagnosis not present

## 2021-03-24 DIAGNOSIS — H2513 Age-related nuclear cataract, bilateral: Secondary | ICD-10-CM | POA: Diagnosis not present

## 2021-03-24 DIAGNOSIS — H0102A Squamous blepharitis right eye, upper and lower eyelids: Secondary | ICD-10-CM | POA: Diagnosis not present

## 2021-03-24 DIAGNOSIS — E119 Type 2 diabetes mellitus without complications: Secondary | ICD-10-CM | POA: Diagnosis not present

## 2021-03-28 ENCOUNTER — Encounter: Payer: Medicaid Other | Admitting: Internal Medicine

## 2021-04-01 ENCOUNTER — Other Ambulatory Visit (INDEPENDENT_AMBULATORY_CARE_PROVIDER_SITE_OTHER): Payer: Medicaid Other

## 2021-04-01 ENCOUNTER — Telehealth: Payer: Self-pay

## 2021-04-01 DIAGNOSIS — Z1152 Encounter for screening for COVID-19: Secondary | ICD-10-CM

## 2021-04-01 LAB — POC COVID19 BINAXNOW: SARS Coronavirus 2 Ag: NEGATIVE

## 2021-04-01 NOTE — Telephone Encounter (Signed)
Congestion and headache Started 03/29/21 Teraflu, has not helped Musinex, not help Has not had covid test  Pt called to report that she has had congestions and headache since 03/29/21. Has taken theraflu and mucinex, but it has not helped. Has not has a covid test. Will come in 9/20 to get a covid test done

## 2021-04-01 NOTE — Telephone Encounter (Signed)
Covid test was negative

## 2021-04-03 IMAGING — US US OB < 14 WEEKS - US OB TV
1 series · 15 of 28 positions shown · non-contrast
Comparison: None this pregnancy.

CLINICAL DATA: Pregnant patient in first-trimester pregnancy with
vaginal bleeding. Last menstrual period 07/15/2019, gestational age
by LMP 11 weeks 5 days.

EXAM:
OBSTETRIC <14 WK US AND TRANSVAGINAL OB US
TECHNIQUE: Both transabdominal and transvaginal ultrasound examinations were
performed for complete evaluation of the gestation as well as the
maternal uterus, adnexal regions, and pelvic cul-de-sac.
Transvaginal technique was performed to assess early pregnancy.

[Series 1: us ob < 14 weeks - us ob tv · 15 of 115 slices shown]
[im 1/115]
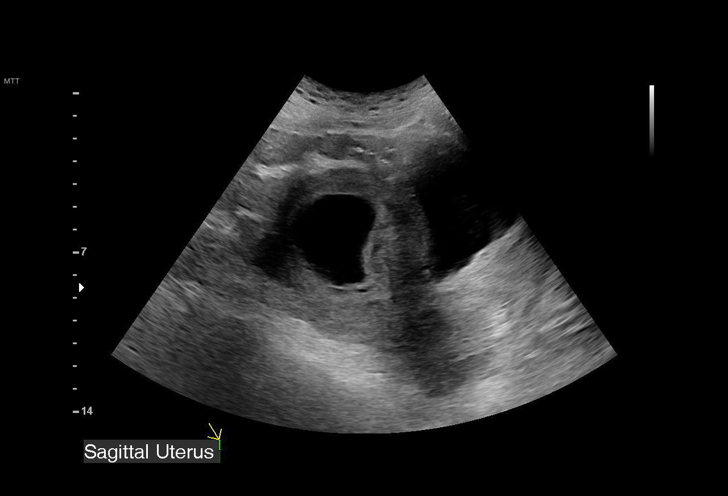
[im 9/115]
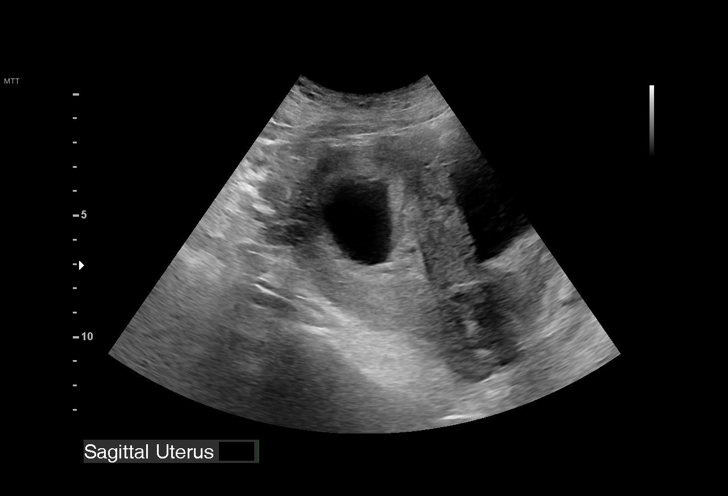
[im 17/115]
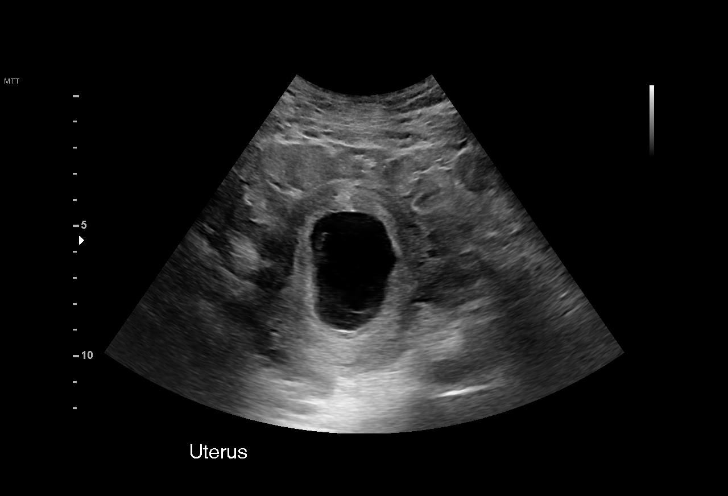
[im 26/115]
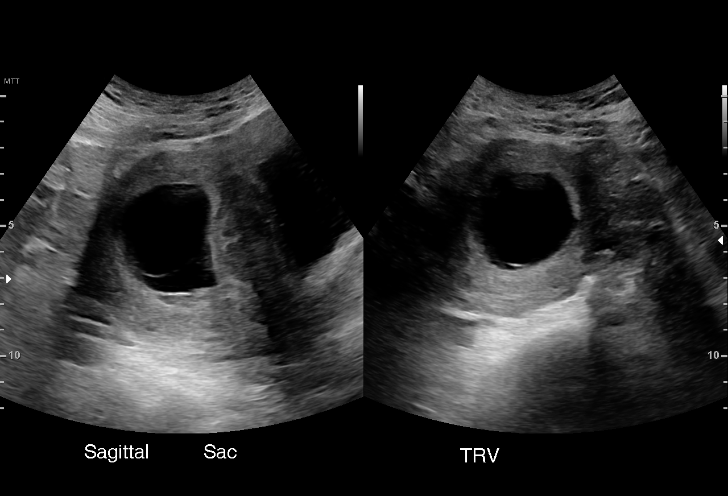
[im 34/115]
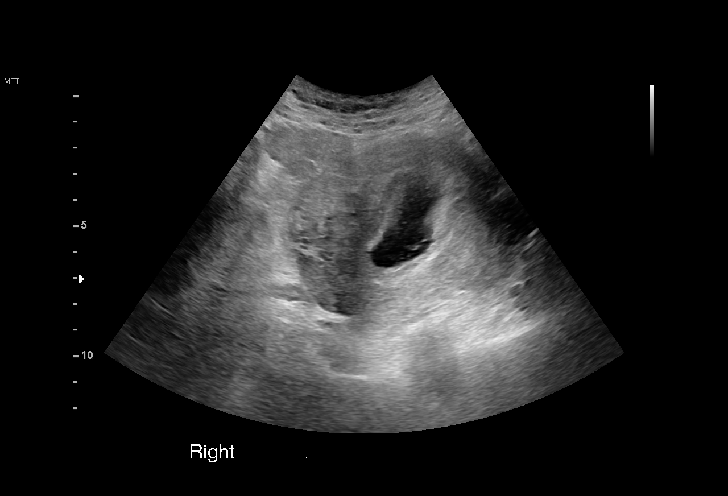
[im 43/115]
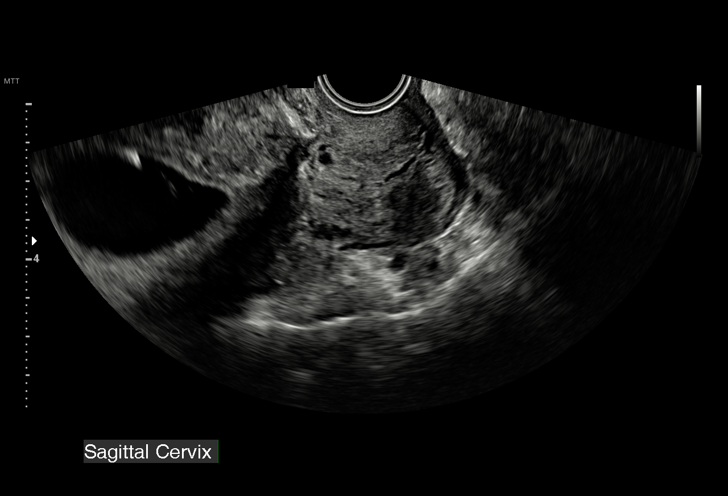
[im 51/115]
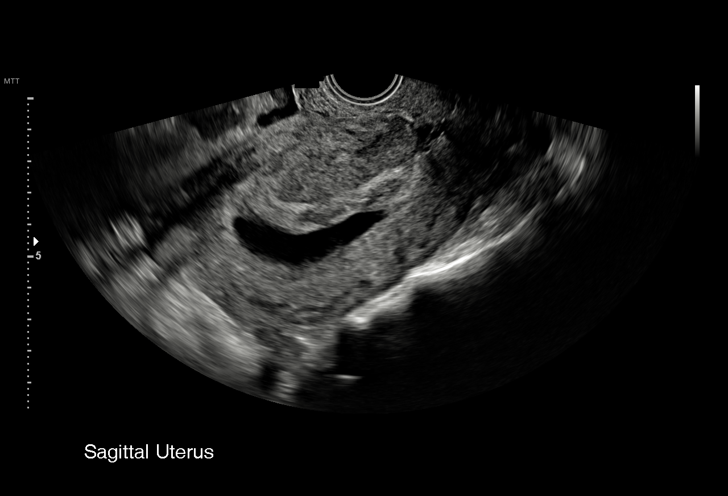
[im 60/115]
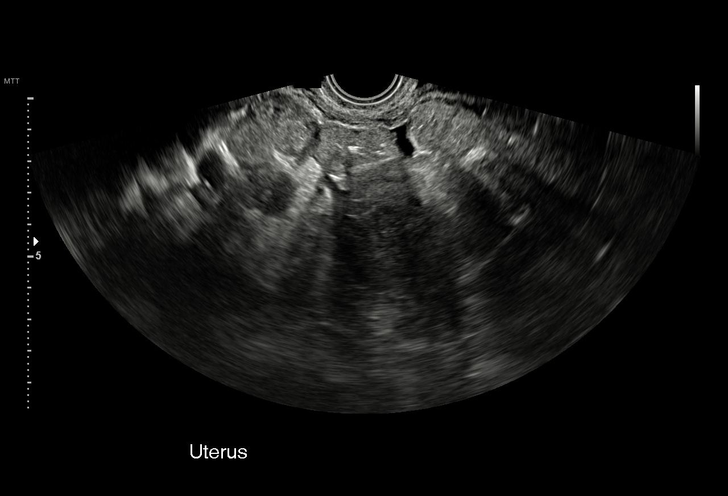
[im 64/115]
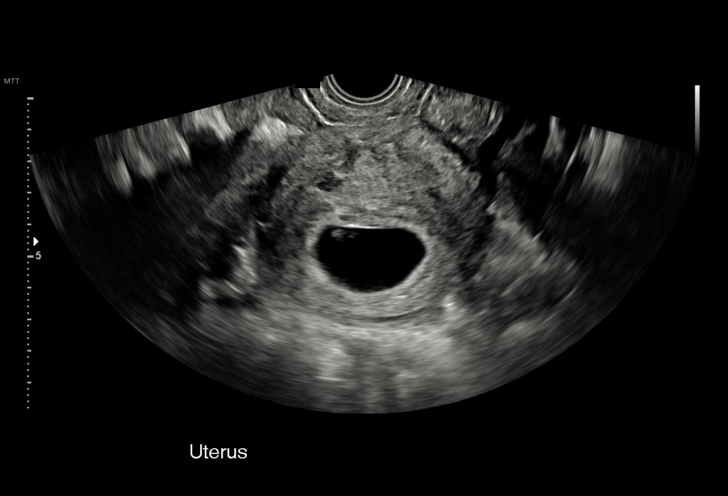
[im 72/115]
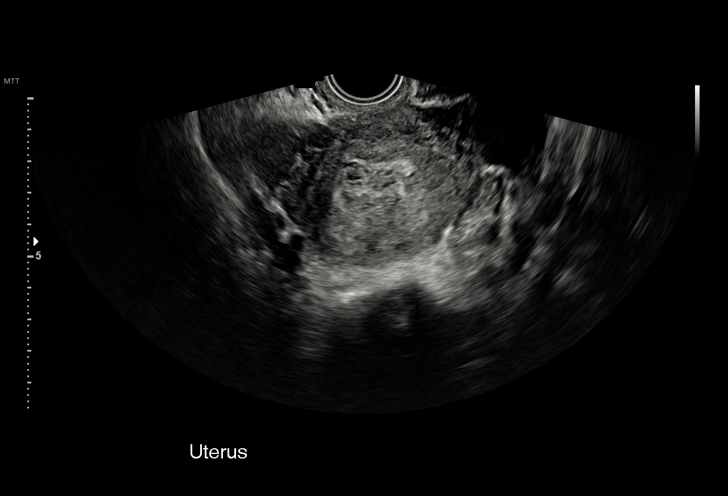
[im 81/115]
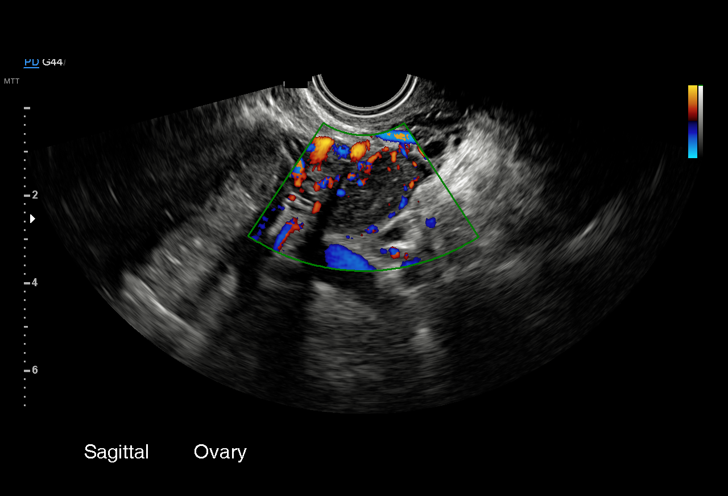
[im 89/115]
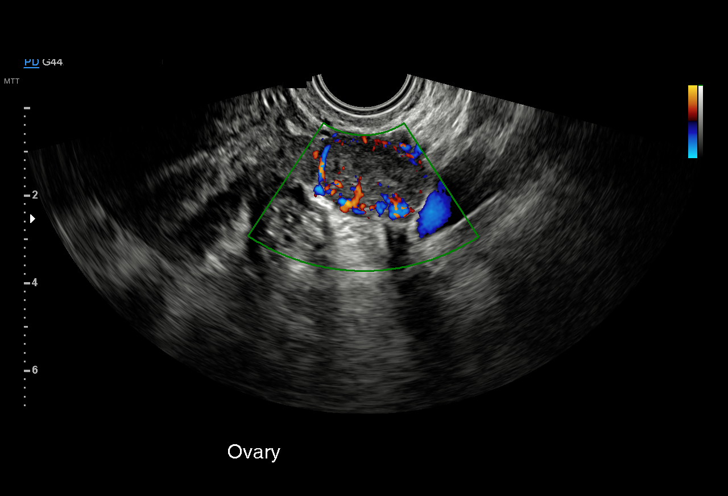
[im 98/115]
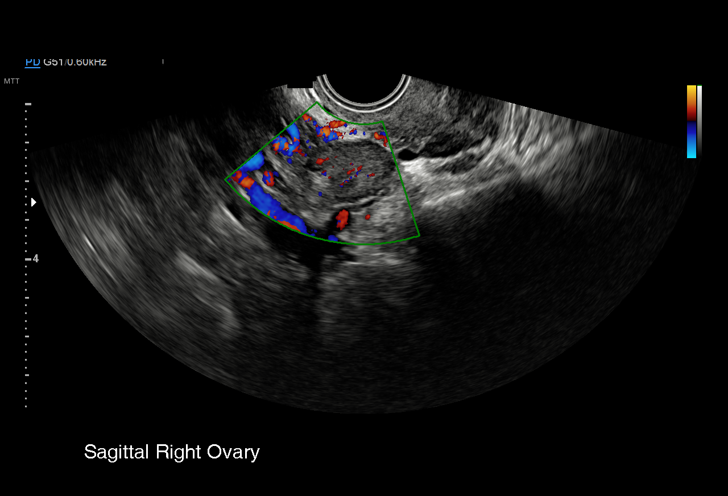
[im 106/115]
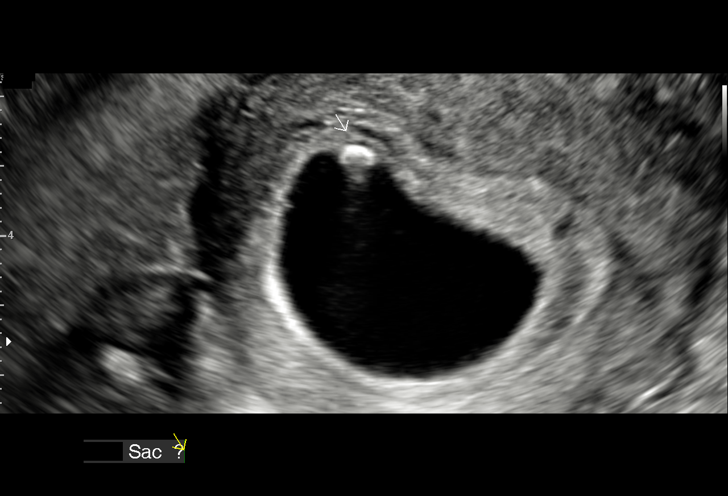
[im 115/115]
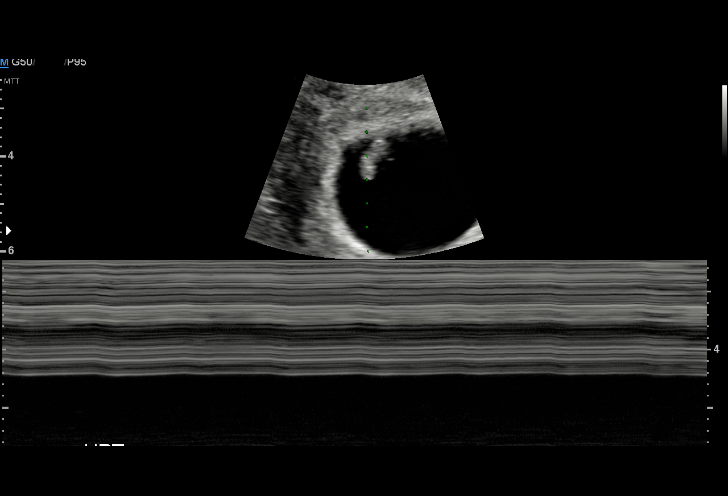

[15 of 28 positions shown; findings below may reference images not displayed]

FINDINGS: Intrauterine gestational sac: Single

Yolk sac:  Possibly visualized but echogenic.

Embryo:  Visualized.

Cardiac Activity: Not Visualized.

Heart Rate: 0 bpm

CRL:  9.3 mm   6 w   6 d

Subchorionic hemorrhage:  Possibly small.

Maternal uterus/adnexae: Both ovaries are visualized and are normal.
Probable small corpus luteal cyst in the left ovary. No extra
ovarian adnexal mass. No pelvic free fluid.
IMPRESSION: Single intrauterine gestation with crown-rump length of 9 mm, but no
fetal cardiac activity. Findings meet definitive criteria for failed
pregnancy. This follows SRU consensus guidelines: Diagnostic
Criteria for Nonviable Pregnancy Early in the First Trimester. N
Engl J Med 7728;[DATE].

## 2021-04-03 NOTE — Telephone Encounter (Signed)
Pt did not answer phone call. Unable to leave voicemail

## 2021-04-06 ENCOUNTER — Other Ambulatory Visit: Payer: Self-pay | Admitting: Internal Medicine

## 2021-05-26 ENCOUNTER — Ambulatory Visit (INDEPENDENT_AMBULATORY_CARE_PROVIDER_SITE_OTHER): Payer: Medicaid Other | Admitting: Internal Medicine

## 2021-05-26 ENCOUNTER — Other Ambulatory Visit: Payer: Self-pay

## 2021-05-26 ENCOUNTER — Encounter: Payer: Self-pay | Admitting: Internal Medicine

## 2021-05-26 VITALS — BP 128/78 | HR 72 | Resp 16 | Ht 63.5 in | Wt 194.0 lb

## 2021-05-26 DIAGNOSIS — Z79899 Other long term (current) drug therapy: Secondary | ICD-10-CM

## 2021-05-26 DIAGNOSIS — Z1231 Encounter for screening mammogram for malignant neoplasm of breast: Secondary | ICD-10-CM | POA: Diagnosis not present

## 2021-05-26 DIAGNOSIS — E119 Type 2 diabetes mellitus without complications: Secondary | ICD-10-CM

## 2021-05-26 DIAGNOSIS — Z0000 Encounter for general adult medical examination without abnormal findings: Secondary | ICD-10-CM | POA: Diagnosis not present

## 2021-05-26 DIAGNOSIS — R195 Other fecal abnormalities: Secondary | ICD-10-CM | POA: Diagnosis not present

## 2021-05-26 DIAGNOSIS — Z1159 Encounter for screening for other viral diseases: Secondary | ICD-10-CM

## 2021-05-26 NOTE — Progress Notes (Signed)
Subjective:    Patient ID: [name], female   DOB: Sep 11, 1980, 40 y.o.   MRN: [id number]   HPI  CPE with pap  1.  Pap:  Last 06/18/2020 and normal.  Had SAB April 2021 at [redacted] weeks gestation.    2.  Mammogram:  Last 04/19/2019 and normal.  No family history of breast cancer.    3.  Osteoprevention:  Has 2 cups milk daily.  Does eat cheese and yogurt, but not every day. On her feet all day.  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    4.  Guaiac Cards/FIT:  1/3 guaiac cards + for blood 03/28/2019.    5.  Colonoscopy:  poor prep with colonoscopy 05/18/2019.  Performed for Heme + stool.  She was to return after adequate prep, but states no one contacted her.  Does not like the prep.    6.  Immunizations:  Wants to think about bivalent COVID booster.   Immunization History  Administered Date(s) Administered   Influenza Inj Mdck Quad Pf 04/27/2017   Influenza Split 04/26/2019   Influenza-Unspecified 07/03/2015, 06/07/2018, 05/09/2021   Moderna Sars-Covid-2 Vaccination 07/25/2019, 08/24/2019, 06/25/2020   Pneumococcal Polysaccharide-23 04/06/2016   Tdap 03/06/2015     7.  Glucose/Cholesterol :  Last A1C was 6.3%  on 09/23/20.on Metformin.  LDL not at goal, but patient was pregnant last year and does not use birth control--would like to get pregnant.  Statin on hold until done with child bearing years.    Current Meds  Medication Sig   blood glucose meter kit and supplies KIT Check sugars twice daily before meals.   Cholecalciferol (VITAMIN D3 PO) Take one pill daily, 1000 IU   gabapentin (NEURONTIN) 300 MG capsule Take 1 capsule by mouth twice daily   glucose blood test strip Use as instructed   metFORMIN (GLUCOPHAGE) 500 MG tablet TAKE 1 TABLET BY MOUTH TWICE DAILY WITH A MEAL   No Known Allergies  Past Medical History:  Diagnosis Date   Biological false positive RPR test 08/23/2016   FTA-ABS negative   Diabetes mellitus without complication (HCC) 2011   Dyslipidemia  04/27/2017   Gestational diabetes    Hyperthyroidism    Positive PPD 2016   + PPD :  history of BCG as well.  Was unable to treat for latent when pregnant.  Sent for CXR 12.2017, which was negative for active TB.  To go to PHD for treatment of latent.  07/2016 documented   Thyroid nodule 02/05/2017   Thyroid nodule with FNA with atypia of unknown significance and hyperthyroidism (Grave's Disease).  Was seen by [name].  Apparently deciding whether to undergo thyroidectomy vs medication  (methimazole or RAI treatment per patient.) She elected to take Methimazole.  Stopped with pregnancy in 10/2019 and thyroid testing okay   Visual acuity reduced 2008   Wears glasses only for nighttime driving   Past Surgical History:  Procedure Laterality Date   CESAREAN SECTION N/A 03/04/2015   Procedure: CESAREAN SECTION;  Surgeon: Alice Cosier, MD;  Location: [facility];  Service: Obstetrics;  Laterality: N/A;   DILATION AND EVACUATION N/A 10/18/2019   Procedure: DILATATION AND EVACUATION;  Surgeon: Alice Antigua, MD;  Location: [facility];  Service: Gynecology;  Laterality: N/A;   FNA thyroid nodule  06/22/2018   path equivocal--cells of unknown signficance.  Followed by [facility] and being treated with Methimazole for hyperthryroidism.   Family History  Problem Relation Age of Onset  Stroke Paternal Grandmother    Family Status  Relation Name Status   Mother  Alive, age 33y   Father  Alive, age 50y   Sister  Alive       healthy   Brother  Alive       healthy   Daughter [name] Alive, age 54y   Son [name], age 22y   Sister  Alive       healthy   Brother  Alive       healthy   Brother  Alive       healthy   Daughter [name], age 110y   Daughter [name], age 7y   Son [name], age 92y   PGM  Deceased at age 63   Social History   Social History Narrative   Widowed in 2004 when husband killed in MVA.   Originally from [place]    Came to [employer] [place] In 2001     Her first 2 children are with her first husband, who came with her to [place] And was killed in MVA here.   Middle child with a second man she married and divorced after 5 years.     Younger two children with her current husband   Her oldest daughter is married (10/2015) in [place]--grew up in maternal grandmother's home (has not been with this daughter since her daughter was 53 yo)   Lives with third husband and 4 of her children.        Social History   Tobacco Use   Smoking status: Never    Passive exposure: Never   Smokeless tobacco: Never  Vaping Use   Vaping Use: Never used  Substance Use Topics   Alcohol use: No    Alcohol/week: 0.0 standard drinks of alcohol   Drug use: No    Review of Systems  HENT:  Negative for dental problem.   Eyes:  Negative for visual disturbance (Believes had eye check with Dr. Dione Harris in September.  No diabetic changes.).  Respiratory:  Negative for shortness of breath.   Cardiovascular:  Negative for chest pain, palpitations and leg swelling.     Objective:   BP 128/78 (BP Location: Right Arm, Patient Position: Sitting, Cuff Size: Normal)   Pulse 72   Resp 16   Ht 5' 3.5" (1.613 m)   Wt 194 lb (88 kg)   LMP 05/05/2021 (Exact Date)   BMI 33.83 kg/m   Physical Exam HENT:     Head: Normocephalic and atraumatic.     Right Ear: Tympanic membrane, ear canal and external ear normal.     Left Ear: Tympanic membrane and ear canal normal.     Mouth/Throat:     Mouth: Mucous membranes are moist.     Pharynx: Oropharynx is clear.  Eyes:     Extraocular Movements: Extraocular movements intact.     Pupils: Pupils are equal, round, and reactive to light.     Comments: Discs sharp.  Neck:     Thyroid: Thyromegaly (Nodular) present.  Cardiovascular:     Rate and Rhythm: Normal rate and regular rhythm.     Pulses:          Dorsalis pedis pulses are 2+ on the right side and 2+ on the left side.       Posterior tibial pulses  are 2+ on the right side and 2+ on the left side.     Heart sounds: S1 normal and S2 normal. Murmur heard.  No friction rub. No S3 or S4 sounds.     Comments: No carotid bruits.  Carotid, radial, femoral, DP and PT pulses normal and equal.   Pulmonary:     Effort: Pulmonary effort is normal.     Breath sounds: Normal breath sounds.  Chest:  Breasts:    Right: No inverted nipple, mass or nipple discharge.     Left: No inverted nipple, mass or nipple discharge.  Abdominal:     General: Bowel sounds are normal.     Palpations: Abdomen is soft. There is no hepatomegaly, splenomegaly or mass.     Tenderness: There is no abdominal tenderness.     Hernia: No hernia is present.  Genitourinary:    Comments: Normal external female genitalia. No uterine or adnexal mass or tenderness. Musculoskeletal:        General: Normal range of motion.     Cervical back: Normal range of motion and neck supple.     Right lower leg: No edema.     Left lower leg: No edema.  Feet:     Right foot:     Protective Sensation: 10 sites tested.  10 sites sensed.     Skin integrity: Skin integrity normal.     Toenail Condition: Right toenails are normal.     Left foot:     Protective Sensation: 10 sites tested.  10 sites sensed.     Skin integrity: Skin integrity normal.     Toenail Condition: Left toenails are normal.  Lymphadenopathy:     Head:     Right side of head: No submental or submandibular adenopathy.     Left side of head: No submental or submandibular adenopathy.     Cervical: No cervical adenopathy.     Upper Body:     Right upper body: No supraclavicular or axillary adenopathy.     Left upper body: No supraclavicular or axillary adenopathy.     Lower Body: No right inguinal adenopathy. No left inguinal adenopathy.  Skin:    General: Skin is warm.     Capillary Refill: Capillary refill takes less than 2 seconds.     Findings: No rash.  Neurological:     General: No focal deficit present.      Mental Status: She is alert and oriented to person, place, and time.     Cranial Nerves: Cranial nerves 2-12 are intact.     Sensory: Sensation is intact.     Motor: Motor function is intact.     Coordination: Coordination is intact.     Gait: Gait is intact.     Deep Tendon Reflexes: Reflexes are normal and symmetric.  Psychiatric:        Attention and Perception: Attention normal.        Speech: Speech normal.        Behavior: Behavior normal. Behavior is cooperative.      Assessment & Plan     CPE without pap Mammogram ordered No interest in bivalent COVID vaccine today Hep C screening, CBC, CMP, A1C.  2.  History heme + stool with suboptimal prep on diagnostic colonoscopy:  referral back to GI for repeat colonoscopy with good prep.  3.  DM:  A1C.  Urine microalbumin/crea

## 2021-05-29 ENCOUNTER — Other Ambulatory Visit: Payer: Self-pay

## 2021-05-29 LAB — COMPREHENSIVE METABOLIC PANEL
ALT: 10 IU/L (ref 0–32)
AST: 20 IU/L (ref 0–40)
Albumin/Globulin Ratio: 1.5 (ref 1.2–2.2)
Albumin: 4.5 g/dL (ref 3.8–4.8)
Alkaline Phosphatase: 50 IU/L (ref 44–121)
BUN/Creatinine Ratio: 15 (ref 9–23)
BUN: 7 mg/dL (ref 6–24)
Bilirubin Total: 0.2 mg/dL (ref 0.0–1.2)
CO2: 18 mmol/L — ABNORMAL LOW (ref 20–29)
Calcium: 9.5 mg/dL (ref 8.7–10.2)
Chloride: 102 mmol/L (ref 96–106)
Creatinine, Ser: 0.48 mg/dL — ABNORMAL LOW (ref 0.57–1.00)
Globulin, Total: 3.1 g/dL (ref 1.5–4.5)
Glucose: 107 mg/dL — ABNORMAL HIGH (ref 70–99)
Potassium: 4.9 mmol/L (ref 3.5–5.2)
Sodium: 138 mmol/L (ref 134–144)
Total Protein: 7.6 g/dL (ref 6.0–8.5)
eGFR: 121 mL/min/{1.73_m2} (ref >59)

## 2021-05-29 LAB — CBC WITH DIFFERENTIAL/PLATELET
Basophils Absolute: 0 10*3/uL (ref 0.0–0.2)
Basos: 1 %
EOS (ABSOLUTE): 0.1 10*3/uL (ref 0.0–0.4)
Eos: 2 %
Hematocrit: 40.7 % (ref 34.0–46.6)
Hemoglobin: 13.4 g/dL (ref 11.1–15.9)
Immature Grans (Abs): 0 10*3/uL (ref 0.0–0.1)
Immature Granulocytes: 0 %
Lymphocytes Absolute: 1.8 10*3/uL (ref 0.7–3.1)
Lymphs: 38 %
MCH: 29.4 pg (ref 26.6–33.0)
MCHC: 32.9 g/dL (ref 31.5–35.7)
MCV: 89 fL (ref 79–97)
Monocytes Absolute: 0.5 10*3/uL (ref 0.1–0.9)
Monocytes: 11 %
Neutrophils Absolute: 2.2 10*3/uL (ref 1.4–7.0)
Neutrophils: 48 %
Platelets: 223 10*3/uL (ref 150–450)
RBC: 4.56 x10E6/uL (ref 3.77–5.28)
RDW: 12.3 % (ref 11.7–15.4)
WBC: 4.6 10*3/uL (ref 3.4–10.8)

## 2021-05-29 LAB — MICROALBUMIN / CREATININE URINE RATIO

## 2021-05-29 LAB — HGB A1C W/O EAG

## 2021-05-29 LAB — HEPATITIS C ANTIBODY: Hep C Virus Ab: <0.1 s/co ratio (ref 0.0–0.9)

## 2021-05-29 MED ORDER — PRENATAL 27-1 MG PO TABS: 1.0000 | ORAL_TABLET | Freq: Every day | ORAL | 3 refills | Status: DC

## 2021-06-16 ENCOUNTER — Telehealth: Payer: Self-pay

## 2021-06-16 ENCOUNTER — Ambulatory Visit (INDEPENDENT_AMBULATORY_CARE_PROVIDER_SITE_OTHER): Payer: Medicaid Other | Admitting: Internal Medicine

## 2021-06-16 ENCOUNTER — Encounter: Payer: Self-pay | Admitting: Internal Medicine

## 2021-06-16 VITALS — BP 118/78 | HR 80 | Resp 16 | Ht 63.5 in | Wt 193.0 lb

## 2021-06-16 DIAGNOSIS — R0981 Nasal congestion: Secondary | ICD-10-CM

## 2021-06-16 DIAGNOSIS — B349 Viral infection, unspecified: Secondary | ICD-10-CM | POA: Diagnosis not present

## 2021-06-16 LAB — POCT INFLUENZA A/B
Influenza A, POC: NEGATIVE
Influenza B, POC: NEGATIVE

## 2021-06-16 LAB — POC COVID19 BINAXNOW: SARS Coronavirus 2 Ag: NEGATIVE

## 2021-06-16 MED ORDER — MUCINEX SINUS-MAX 5-10-200-325 MG PO TABS: ORAL_TABLET | ORAL | 0 refills | Status: DC

## 2021-06-16 NOTE — Telephone Encounter (Signed)
Appt made for 06/16/21 

## 2021-06-16 NOTE — Progress Notes (Signed)
    Subjective:    Patient ID: [name], female   DOB: 1980-11-04, 40 y.o.   MRN: [id number]   HPI Started with URI symptoms 4 days ago:  sneezing, congested, no cough, weakness.  No fevers.  No dyspnea.  Having to breath out of her mouth as nose closed off.  No sore throat.   Dayquil/Nyquil combo no help. Theraflu with no help.   Went to work this morning and sent home at [employer]   She was checked for COVID this morning and negative.    States her sugars are in low 100s.      Current Meds  Medication Sig   blood glucose meter kit and supplies KIT Check sugars twice daily before meals.   Cholecalciferol (VITAMIN D3 PO) Take one pill daily, 1000 IU   gabapentin (NEURONTIN) 300 MG capsule Take 1 capsule by mouth twice daily   glucose blood test strip Use as instructed   metFORMIN (GLUCOPHAGE) 500 MG tablet TAKE 1 TABLET BY MOUTH TWICE DAILY WITH A MEAL   Phenylephrine-Pheniramine-DM (THERAFLU COLD & COUGH PO) Take by mouth.   Prenatal 27-1 MG TABS Take 1 tablet by mouth daily.   Pseudoeph-Doxylamine-DM-APAP (DAYQUIL/NYQUIL COLD/FLU RELIEF PO) Take by mouth.   No Known Allergies   Review of Systems    Objective:   BP 118/78 (BP Location: Left Arm, Patient Position: Sitting, Cuff Size: Normal)   Pulse 80   Resp 16   Ht 5' 3.5" (1.613 m)   Wt 193 lb (87.5 kg)   LMP 05/31/2021   BMI 33.65 kg/m   Physical Exam Appears fatigued and mouth breathing with obvious nasal congestion HEENT:  PERRL, EOMI, TMs pearly gray, nasal mucosa swollen with mild clear nasal discharge.  NT over sinuses.  Posterior pharynx with some light yellow to clear drainage. Neck:  Supple, No adenopathy Chest:  CTA CV:  RRR without murmur or rub.  Radial and DP pulses normal and equal Abd:  S, NT, + BS  COVID and Influenza A & B POCT:  negative. Assessment & Plan    Viral Syndrome:  rest at home for 48 hours.  Call if not improving.  Cool mist humidifier.  Push fluids.  Mucinex sinus max.   No extra Tylenol--may use ibuprofen instead if needed.  Nasal saline

## 2021-06-16 NOTE — Telephone Encounter (Signed)
Patient has had headache, congestions, and back numbness with teary eyes since 06/13/21. Has taken dayquil/nightquil and Theraflu but it has not helped.   Took COVID test 06/16/21, was negative

## 2021-06-16 NOTE — Patient Instructions (Signed)
Drink lots of fluids--cold and hot Cool mist humidifier in room with you Saline nasal spray Mucinex sinus

## 2021-09-09 ENCOUNTER — Encounter: Payer: Self-pay | Admitting: Internal Medicine

## 2021-09-30 ENCOUNTER — Other Ambulatory Visit: Payer: Self-pay | Admitting: Internal Medicine

## 2021-09-30 NOTE — Telephone Encounter (Signed)
Please check to see if she really wants her prenatal vitamin refilled. ?

## 2021-12-06 ENCOUNTER — Other Ambulatory Visit: Payer: Self-pay | Admitting: Internal Medicine

## 2021-12-06 DIAGNOSIS — E119 Type 2 diabetes mellitus without complications: Secondary | ICD-10-CM

## 2022-01-04 ENCOUNTER — Other Ambulatory Visit: Payer: Self-pay | Admitting: Internal Medicine

## 2022-01-26 ENCOUNTER — Encounter (HOSPITAL_COMMUNITY): Payer: Self-pay

## 2022-01-26 ENCOUNTER — Ambulatory Visit (HOSPITAL_COMMUNITY)
Admission: EM | Admit: 2022-01-26 | Discharge: 2022-01-26 | Disposition: A | Discharge status: Home / Self Care | Payer: BC Managed Care – PPO | Attending: Physician Assistant | Admitting: Physician Assistant

## 2022-01-26 DIAGNOSIS — H5711 Ocular pain, right eye: Secondary | ICD-10-CM

## 2022-01-26 DIAGNOSIS — H18891 Other specified disorders of cornea, right eye: Secondary | ICD-10-CM | POA: Diagnosis not present

## 2022-01-26 MED ORDER — TETRACAINE HCL 0.5 % OP SOLN
OPHTHALMIC | Status: AC
  Filled 2022-01-26: qty 4

## 2022-01-26 MED ORDER — POLYMYXIN B-TRIMETHOPRIM 10000-0.1 UNIT/ML-% OP SOLN: 1.0000 [drp] | Freq: Four times a day (QID) | OPHTHALMIC | 0 refills | Status: DC

## 2022-01-26 MED ORDER — EYE WASH OP SOLN
OPHTHALMIC | Status: AC
  Filled 2022-01-26: qty 118

## 2022-01-26 MED ORDER — FLUORESCEIN SODIUM 1 MG OP STRP
ORAL_STRIP | OPHTHALMIC | Status: AC
  Filled 2022-01-26: qty 1

## 2022-01-26 NOTE — ED Triage Notes (Signed)
Patient presenting with right eye swelling and redness onset last night. Also having right eye pain, feels like something is in the eye.  No one at home sick or having similar symptoms. No changes to skin are or anything used around the eyes. No drainage.

## 2022-01-26 NOTE — Discharge Instructions (Signed)
Advised to use the Polytrim eyedrops 1 drop in the eye 4 times a day for the next several days. Is cool compresses to the area to help reduce the swelling and irritation. Advised to follow-up with PCP or return to urgent care if symptoms fail to improve

## 2022-01-26 NOTE — ED Provider Notes (Signed)
[facility]    CSN: [id number] Arrival date & time: 01/26/22  1218      History   Chief Complaint Chief Complaint  Patient presents with   Eye Pain    HPI [name] is a 41 y.o. female.   41 year old female presents with right thigh pain and tenderness.  Patient relates that she woke up this morning having right pain, and irritation.  Patient relates that it feels like that there is something in the eye.  Patient indicates she can see normally out of the eye, but she has light sensitivity.  Patient indicates she is not having any drainage from the eye and there is no redness of the eye.  Patient relates no fever or chills.  Patient has not had any trauma to the eye   Eye Pain    Past Medical History:  Diagnosis Date   Biological false positive RPR test 08/23/2016   FTA-ABS negative   Diabetes mellitus without complication ([place]) 7829   Dyslipidemia 04/27/2017   Gestational diabetes    Hyperthyroidism    Positive PPD 2016   + PPD :  history of BCG as well.  Was unable to treat for latent when pregnant.  Sent for CXR 12.2017, which was negative for active TB.  To go to PHD for treatment of latent.  07/2016 documented   Thyroid nodule 02/05/2017   Thyroid nodule with FNA with atypia of unknown significance and hyperthyroidism (Grave's Disease).  Was seen by Dr. Lou[facility].  Apparently deciding whether to undergo thyroidectomy vs medication  (methimazole or RAI treatment per patient.) She elected to take Methimazole.  Stopped with pregnancy in 10/2019 and thyroid testing okay   Visual acuity reduced 2008   Wears glasses only for nighttime driving    Patient Active Problem List   Diagnosis Date Noted   Dyslipidemia 04/27/2017   Thyroid nodule 02/05/2017   Cervical lesion 02/05/2017   Biological false positive RPR test 08/23/2016   Hyperthyroidism 04/06/2016   Heart murmur, systolic 56/21/3086   Hyperlipidemia 02/03/2016   Controlled type 2  diabetes mellitus without complication, without long-term current use of insulin ([place]) 06/26/2015   Arm pain 08/06/2014   Positive PPD 07/13/2014   Complex regional pain syndrome 05/16/2014   Radial nerve disease 03/12/2014    Past Surgical History:  Procedure Laterality Date   CESAREAN SECTION N/A 03/04/2015   Procedure: CESAREAN SECTION;  Surgeon: Frederico Hamman, MD;  Location: [facility];  Service: Obstetrics;  Laterality: N/A;   DILATION AND EVACUATION N/A 10/18/2019   Procedure: DILATATION AND EVACUATION;  Surgeon: Mora Bellman, MD;  Location: [facility];  Service: Gynecology;  Laterality: N/A;   FNA thyroid nodule  06/22/2018   path equivocal--cells of unknown signficance.  Followed by [facility] and being treated with Methimazole for hyperthryroidism.    OB History     Gravida  7   Para  5   Term  5   Preterm      AB  1   Living  1      SAB  1   IAB      Ectopic      Multiple  0   Live Births  1            Home Medications    Prior to Admission medications   Medication Sig Start Date End Date Taking? Authorizing Provider  gabapentin (NEURONTIN) 300 MG capsule Take 1 capsule by mouth twice daily 01/06/22  Yes Mack Hook, MD  metFORMIN (GLUCOPHAGE) 500 MG tablet TAKE 1 TABLET BY MOUTH TWICE DAILY WITH A MEAL 12/09/21  Yes Mack Hook, MD  Prenatal 27-1 MG TABS Take 1 tablet by mouth once daily 10/06/21  Yes Mack Hook, MD  trimethoprim-polymyxin b (POLYTRIM) ophthalmic solution Place 1 drop into both eyes every 6 (six) hours. 01/26/22  Yes Nyoka Lint, PA-C  blood glucose meter kit and supplies KIT Check sugars twice daily before meals. 04/27/17   Mack Hook, MD  Cholecalciferol (VITAMIN D3 PO) Take one pill daily, 1000 IU    [provider]  glucose blood test strip Use as instructed 04/27/17   Mack Hook, MD  Phenylephrine-DM-GG-APAP [facility]) 5-10-200-325 MG TABS Take as  directed 06/16/21   Mack Hook, MD    Family History Family History  Problem Relation Age of Onset   Stroke Paternal Grandmother     Social History Social History   Tobacco Use   Smoking status: Never   Smokeless tobacco: Never  Vaping Use   Vaping Use: Never used  Substance Use Topics   Alcohol use: No    Alcohol/week: 0.0 standard drinks of alcohol   Drug use: No     Allergies   Patient has no known allergies.   Review of Systems Review of Systems  Eyes:  Positive for pain (right).     Physical Exam Triage Vital Signs ED Triage Vitals  Enc Vitals Group     BP 01/26/22 1342 123/80     Pulse Rate 01/26/22 1342 70     Resp 01/26/22 1342 16     Temp 01/26/22 1342 98.5 F (36.9 C)     Temp Source 01/26/22 1342 Oral     SpO2 01/26/22 1342 98 %     Weight 01/26/22 1345 191 lb (86.6 kg)     Height 01/26/22 1345 5' 4"  (1.626 m)     Head Circumference --      Peak Flow --      Pain Score 01/26/22 1345 10     Pain Loc --      Pain Edu? --      Excl. in [place]? --    No data found.  Updated Vital Signs BP 123/80 (BP Location: Left Arm)   Pulse 70   Temp 98.5 F (36.9 C) (Oral)   Resp 16   Ht 5' 4"  (1.626 m)   Wt 191 lb (86.6 kg)   LMP 01/05/2022 (Exact Date)   SpO2 98%   BMI 32.79 kg/m   Visual Acuity Right Eye Distance:   Left Eye Distance:   Bilateral Distance:    Right Eye Near:   Left Eye Near:    Bilateral Near:     Physical Exam Constitutional:      Appearance: Normal appearance.  Eyes:     General: Lids are normal.     Extraocular Movements: Extraocular movements intact.     Comments: Right eye: Upper and lower lids are normal, eversion of upper and lower are normal without evidence of foreign bodies.  PERRLA.  Conjunctivae is normal without any redness. Right eye staining: There is no evidence of corneal abrasion or foreign bodies noted under stain and fluorescent light.  Neurological:     Mental Status: She is alert.       UC Treatments / Results  Labs (all labs ordered are listed, but only abnormal results are displayed) Labs Reviewed - No data to display  EKG   Radiology  No results found.  Procedures Procedures (including critical care time)  Medications Ordered in UC Medications - No data to display  Initial Impression / Assessment and Plan / UC Course  I have reviewed the triage vital signs and the nursing notes.  Pertinent labs & imaging results that were available during my care of the patient were reviewed by me and considered in my medical decision making (see chart for details).    Plan: 1.  To use the Polytrim eyedrops 1 drop in the eye 4 times a day for the next several days to treat the eye. 2.  Advised to use sunglasses to keep the irritation of the eye and bright light. 3.  Advised follow-up PCP or return to urgent care if symptoms fail to improve. Final Clinical Impressions(s) / UC Diagnoses   Final diagnoses:  Pain of right eye  Corneal irritation of right eye     Discharge Instructions      Advised to use the Polytrim eyedrops 1 drop in the eye 4 times a day for the next several days. Is cool compresses to the area to help reduce the swelling and irritation. Advised to follow-up with PCP or return to urgent care if symptoms fail to improve    ED Prescriptions     Medication Sig Dispense Auth. Provider   trimethoprim-polymyxin b (POLYTRIM) ophthalmic solution Place 1 drop into both eyes every 6 (six) hours. 10 mL Nyoka Lint, PA-C      PDMP not reviewed this encounter.   Nyoka Lint, PA-C 01/26/22 1417

## 2022-02-06 ENCOUNTER — Other Ambulatory Visit: Payer: Self-pay | Admitting: Internal Medicine

## 2022-02-06 DIAGNOSIS — E0500 Thyrotoxicosis with diffuse goiter without thyrotoxic crisis or storm: Secondary | ICD-10-CM

## 2022-02-06 DIAGNOSIS — E042 Nontoxic multinodular goiter: Secondary | ICD-10-CM

## 2022-02-11 ENCOUNTER — Other Ambulatory Visit: Payer: BC Managed Care – PPO

## 2022-02-13 ENCOUNTER — Ambulatory Visit
Admission: RE | Admit: 2022-02-13 | Discharge: 2022-02-13 | Disposition: A | Discharge status: Home / Self Care | Payer: BC Managed Care – PPO | Source: Ambulatory Visit | Attending: Internal Medicine | Admitting: Internal Medicine

## 2022-02-13 DIAGNOSIS — E042 Nontoxic multinodular goiter: Secondary | ICD-10-CM

## 2022-02-13 DIAGNOSIS — E0500 Thyrotoxicosis with diffuse goiter without thyrotoxic crisis or storm: Secondary | ICD-10-CM | POA: Diagnosis not present

## 2022-02-13 DIAGNOSIS — E041 Nontoxic single thyroid nodule: Secondary | ICD-10-CM | POA: Diagnosis not present

## 2022-03-02 ENCOUNTER — Other Ambulatory Visit: Payer: Self-pay | Admitting: Internal Medicine

## 2022-03-02 DIAGNOSIS — E119 Type 2 diabetes mellitus without complications: Secondary | ICD-10-CM

## 2022-03-26 DIAGNOSIS — H0102A Squamous blepharitis right eye, upper and lower eyelids: Secondary | ICD-10-CM | POA: Diagnosis not present

## 2022-03-26 DIAGNOSIS — E119 Type 2 diabetes mellitus without complications: Secondary | ICD-10-CM | POA: Diagnosis not present

## 2022-03-26 DIAGNOSIS — H2513 Age-related nuclear cataract, bilateral: Secondary | ICD-10-CM | POA: Diagnosis not present

## 2022-03-26 DIAGNOSIS — H3561 Retinal hemorrhage, right eye: Secondary | ICD-10-CM | POA: Diagnosis not present

## 2022-04-10 ENCOUNTER — Encounter (INDEPENDENT_AMBULATORY_CARE_PROVIDER_SITE_OTHER): Payer: Self-pay | Admitting: Ophthalmology

## 2022-05-15 ENCOUNTER — Other Ambulatory Visit (INDEPENDENT_AMBULATORY_CARE_PROVIDER_SITE_OTHER): Payer: Medicaid Other

## 2022-05-15 DIAGNOSIS — E119 Type 2 diabetes mellitus without complications: Secondary | ICD-10-CM | POA: Diagnosis not present

## 2022-05-15 NOTE — Addendum Note (Signed)
Addended by: Mariah Milling on: 05/15/2022 11:47 AM   Modules accepted: Orders

## 2022-05-16 LAB — HEMOGLOBIN A1C
Est. average glucose Bld gHb Est-mCnc: 137 mg/dL
Hgb A1c MFr Bld: 6.4 % — ABNORMAL HIGH (ref 4.8–5.6)

## 2022-05-19 ENCOUNTER — Encounter: Payer: Self-pay | Admitting: Internal Medicine

## 2022-05-19 ENCOUNTER — Ambulatory Visit: Payer: Medicaid Other | Admitting: Internal Medicine

## 2022-05-19 VITALS — BP 104/64 | HR 76 | Resp 16 | Ht 63.5 in | Wt 192.0 lb

## 2022-05-19 DIAGNOSIS — E119 Type 2 diabetes mellitus without complications: Secondary | ICD-10-CM | POA: Diagnosis not present

## 2022-05-19 DIAGNOSIS — E041 Nontoxic single thyroid nodule: Secondary | ICD-10-CM

## 2022-05-19 DIAGNOSIS — R011 Cardiac murmur, unspecified: Secondary | ICD-10-CM

## 2022-05-19 DIAGNOSIS — Z0000 Encounter for general adult medical examination without abnormal findings: Secondary | ICD-10-CM | POA: Diagnosis not present

## 2022-05-19 DIAGNOSIS — Z1231 Encounter for screening mammogram for malignant neoplasm of breast: Secondary | ICD-10-CM

## 2022-05-19 DIAGNOSIS — E0590 Thyrotoxicosis, unspecified without thyrotoxic crisis or storm: Secondary | ICD-10-CM

## 2022-05-19 DIAGNOSIS — Z5986 Financial insecurity: Secondary | ICD-10-CM | POA: Diagnosis not present

## 2022-05-19 DIAGNOSIS — E782 Mixed hyperlipidemia: Secondary | ICD-10-CM

## 2022-05-19 NOTE — Progress Notes (Signed)
Subjective:    Patient ID: [name], female   DOB: Nov 25, 1980, 41 y.o.   MRN: [id number]   HPI  CPE without pap  1.  Pap:  Last performed 06/2020 and normal.    2.  Mammogram:  Last performed 2020 and normal.    3.  Osteoprevention:  Does drink milk once daily and takes vitamin D daily.  Good diet.  On feet most of day with work.  Walks 20 minutes into work daily.    4.  Guaiac Cards/FIT:  Last checked 03/28/2019 and positive for blood.  Did not complete colonoscopy.  5.  Colonoscopy:  Performed 05/2019 for heme + stool, but poor prep.  Was to go back and repeat with better prep, but did not.  Dr. Myrtie Neither, GI.    6.  Immunizations:  Has not had new COVID vaccine. Immunization History  Administered Date(s) Administered   Influenza Inj Mdck Quad Pf 04/27/2017   Influenza Split 04/26/2019   Influenza-Unspecified 07/03/2015, 06/07/2018, 05/09/2021, 05/13/2022   Moderna Sars-Covid-2 Vaccination 07/25/2019, 08/24/2019, 06/25/2020   Pneumococcal Polysaccharide-23 04/06/2016   Tdap 03/06/2015     7.  Glucose/Cholesterol:  A1C recently excellent with level of 6.4% with diabetic control  Has had diagnosis since 2014.  She has not been started on statin as still with regular cycles and refuses any form of birth control.  LDL not at goal in particular.   Lipid Panel     Component Value Date/Time   CHOL 173 09/23/2020 0922   TRIG 76 09/23/2020 0922   HDL 45 09/23/2020 0922   LDLCALC 114 (H) 09/23/2020 0922   LABVLDL 14 09/23/2020 0922     Current Meds  Medication Sig   Cholecalciferol (VITAMIN D3 PO) Take one pill daily, 1000 IU   gabapentin (NEURONTIN) 300 MG capsule Take 1 capsule by mouth twice daily   metFORMIN (GLUCOPHAGE) 500 MG tablet TAKE 1 TABLET BY MOUTH TWICE DAILY WITH A MEAL   Prenatal 27-1 MG TABS Take 1 tablet by mouth once daily   No Known Allergies  Past Medical History:  Diagnosis Date   Biological false positive RPR test 08/23/2016   FTA-ABS  negative   Diabetes mellitus without complication (HCC) 2011   Dyslipidemia 04/27/2017   Gestational diabetes    Hyperthyroidism    Positive PPD 2016   + PPD :  history of BCG as well.  Was unable to treat for latent when pregnant.  Sent for CXR 12.2017, which was negative for active TB.  To go to [facility] for treatment of latent.  07/2016 documented   Thyroid nodule 02/05/2017   Thyroid nodule with FNA with atypia of unknown significance and hyperthyroidism (Grave's Disease).  Was seen by Dr. Durene Fruits Endocrine.  Apparently deciding whether to undergo thyroidectomy vs medication  (methimazole or RAI treatment per patient.) She elected to take Methimazole.  Stopped with pregnancy in 10/2019 and thyroid testing okay   Visual acuity reduced 2008   Wears glasses only for nighttime driving   Past Surgical History:  Procedure Laterality Date   CESAREAN SECTION N/A 03/04/2015   Procedure: CESAREAN SECTION;  Surgeon: Alice Cosier, MD;  Location: [facility];  Service: Obstetrics;  Laterality: N/A;   DILATION AND EVACUATION N/A 10/18/2019   Procedure: DILATATION AND EVACUATION;  Surgeon: Alice Antigua, MD;  Location: [facility];  Service: Gynecology;  Laterality: N/A;   FNA thyroid nodule  06/22/2018   path equivocal--cells of unknown signficance.  Followed by  Endocrine/[facility] and being treated with Methimazole for hyperthryroidism.   Family History  Problem Relation Age of Onset   Stroke Paternal Grandmother    Family Status  Relation Name Status   Mother  Alive, age 64y   Father  Alive, age 39y   Sister  Alive       healthy   Brother  Alive       healthy   Daughter Programmer, applications, age 46y   Son [name], age 22y   Sister  Alive       healthy   Brother  Alive       healthy   Brother  Alive       healthy   Daughter Futures trader, age 101y   Daughter [name], age 7y   Son Abdour Alive, age 37y   PGM  Deceased at age 46   Social History   Socioeconomic  History   Marital status: Married    Spouse name: Boubacar-1st husband die   Number of children: 5   Years of education: 6   Highest education level: Not on file  Occupational History   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   Originally from [place]   Came to [place]. In 2001     Her first 2 children are with her first husband, who came with her to [place] And was killed in MVA here.   Middle child with a second man she married and divorced after 5 years.     Younger two children with her current husband   Her oldest daughter is married (10/2015) in [place]--grew up in maternal grandmother's home (has not been with this daughter since her daughter was 71 yo)   Lives with third husband and 4 of her children.        Social Determinants of Health   Financial Resource Strain: Medium Risk (05/19/2022)   Overall Financial Resource Strain (CARDIA)    Difficulty of Paying Living Expenses: Somewhat hard  Food Insecurity: No Food Insecurity (05/19/2022)   Hunger Vital Sign    Worried About Running Out of Food in the Last Year: Never true    Ran Out of Food in the Last Year: Never true  Transportation Needs: No Transportation Needs (05/19/2022)   PRAPARE - Administrator, Civil Service (Medical): No    Lack of Transportation (Non-Medical): No  Physical Activity: Sufficiently Active (03/07/2019)   Exercise Vital Sign    Days of Exercise per Week: 7 days    Minutes of Exercise per Session: 30 min  Stress: Not on file  Social Connections: Unknown (03/07/2019)    Social Connection and Isolation Panel [NHANES]    Frequency of Communication with Friends and Family: Not on file    Frequency of Social Gatherings with Friends and Family: Not on file    Attends Religious Services: Not on file    Active Member of Clubs or Organizations: Not on file    Attends Club  or Organization Meetings: Not on file    Marital Status: Married  Intimate Partner Violence: Not At Risk (05/26/2021)   Humiliation, Afraid, Rape, and Kick questionnaire    Fear of Current or Ex-Partner: No    Emotionally Abused: No    Physically Abused: No    Sexually Abused: No      Review of Systems  HENT:  Negative for dental problem (No dental care.).   Eyes:  Positive for visual disturbance (had eyes checked with Dr. Dione Booze in September.  Had hemorrhage in right eye and followed by Retinal specialist.  States her vision is fine.).  Respiratory:  Negative for shortness of breath.   Cardiovascular:  Negative for chest pain, palpitations and leg swelling.  Neurological:  Positive for headaches (Awakened with bifrontal HA.  similar to a HA she gets prior to her period.  Feels a bit dizzy with it--very similar to before period and last was Oct 4th.  No photophobia). Negative for weakness and numbness.      Objective:   BP 104/64 (BP Location: Left Arm, Patient Position: Sitting, Cuff Size: Normal)   Pulse 76   Resp 16   Ht 5' 3.5" (1.613 m)   Wt 192 lb (87.1 kg)   LMP 04/15/2022 (Exact Date)   BMI 33.48 kg/m   Physical Exam HENT:     Head: Normocephalic and atraumatic.     Right Ear: Tympanic membrane, ear canal and external ear normal.     Left Ear: Tympanic membrane, ear canal and external ear normal.     Nose: Nose normal.     Mouth/Throat:     Mouth: Mucous membranes are moist.     Pharynx: Oropharynx is clear.  Eyes:     Extraocular Movements: Extraocular movements intact.     Pupils: Pupils are equal, round, and reactive to light.     Comments: Discs sharp  Neck:      Thyroid: Thyromegaly (Nodular) present.  Cardiovascular:     Rate and Rhythm: Normal rate and regular rhythm.     Pulses:          Dorsalis pedis pulses are 2+ on the right side and 2+ on the left side.       Posterior tibial pulses are 2+ on the right side and 2+ on the left side.     Heart sounds: S1 normal and S2 normal. Murmur (Radiates to carotids.) heard.     Systolic murmur is present with a grade of 3/6.     No friction rub. No S3 or S4 sounds.     Comments: No carotid bruits.  Carotid, radial, femoral, DP and PT pulses normal and equal.   Pulmonary:     Effort: Pulmonary effort is normal.     Breath sounds: Normal breath sounds and air entry.  Chest:  Breasts:    Right: No inverted nipple, mass or nipple discharge.     Left: No inverted nipple, mass or nipple discharge.  Abdominal:     General: Bowel sounds are normal.     Palpations: Abdomen is soft. There is no hepatomegaly, splenomegaly or mass.     Tenderness: There is no abdominal tenderness.     Hernia: No hernia is present.  Genitourinary:    Comments: Normal external female genitalia No uterine or adnexal mass or tenderness. Musculoskeletal:        General: Normal range of motion.     Cervical back: Normal range of motion and neck supple.  Right lower leg: No edema.     Left lower leg: No edema.  Feet:     Right foot:     Protective Sensation: 10 sites tested.  10 sites sensed.     Skin integrity: Skin integrity normal.     Toenail Condition: Right toenails are normal.     Left foot:     Protective Sensation: 10 sites tested.  10 sites sensed.     Skin integrity: Skin integrity normal.     Toenail Condition: Left toenails are normal.  Lymphadenopathy:     Head:     Right side of head: No submental or submandibular adenopathy.     Left side of head: No submental or submandibular adenopathy.     Cervical: No cervical adenopathy.     Upper Body:     Right upper body: No supraclavicular or axillary  adenopathy.     Left upper body: No supraclavicular or axillary adenopathy.     Lower Body: No right inguinal adenopathy. No left inguinal adenopathy.  Skin:    General: Skin is warm.     Capillary Refill: Capillary refill takes less than 2 seconds.     Findings: No rash.  Neurological:     General: No focal deficit present.     Mental Status: She is alert and oriented to person, place, and time.     Cranial Nerves: Cranial nerves 2-12 are intact.     Sensory: Sensation is intact.     Motor: Motor function is intact.     Coordination: Coordination is intact.     Gait: Gait is intact.     Deep Tendon Reflexes: Reflexes are normal and symmetric.  Psychiatric:        Mood and Affect: Mood normal.        Speech: Speech normal.        Behavior: Behavior normal. Behavior is cooperative.      Assessment & Plan    CPE without pap [facility] for [facility].  Has medicaid.   Fasting labs in 2 weeks.:  CBC, CMP, FLP, urine microalbumin/crea  2.  DM;  controlled. Eye concerns as per Dr. Dione Booze and retinal specialist.  3.  Elevated LDL no statin until out of child bearing years.  4  Financial Insecurity:  Referral to [facility] for food access until starts her second job with [employer].  5.  Multinodular goiter with history of hyperthyroidism:  as per Dr. Sharl Ma, Endocrinology.  She has had thyroid function in normal range since stopped methimazole in 2021 with pregnancy.  6. Systolic Heart Murmur: Echo in 2017 showed only mild tricuspid regurgitation.  Murmur unchanged.

## 2022-06-09 ENCOUNTER — Other Ambulatory Visit (INDEPENDENT_AMBULATORY_CARE_PROVIDER_SITE_OTHER): Payer: Medicaid Other

## 2022-06-09 DIAGNOSIS — E119 Type 2 diabetes mellitus without complications: Secondary | ICD-10-CM | POA: Diagnosis not present

## 2022-06-09 DIAGNOSIS — Z79899 Other long term (current) drug therapy: Secondary | ICD-10-CM | POA: Diagnosis not present

## 2022-06-09 DIAGNOSIS — E782 Mixed hyperlipidemia: Secondary | ICD-10-CM

## 2022-06-09 DIAGNOSIS — Z1211 Encounter for screening for malignant neoplasm of colon: Secondary | ICD-10-CM | POA: Diagnosis not present

## 2022-06-09 LAB — POC FIT TEST STOOL: Fecal Occult Blood: NEGATIVE

## 2022-06-10 LAB — COMPREHENSIVE METABOLIC PANEL
ALT: 11 IU/L (ref 0–32)
AST: 13 IU/L (ref 0–40)
Albumin/Globulin Ratio: 1.6 (ref 1.2–2.2)
Albumin: 4.2 g/dL (ref 3.9–4.9)
Alkaline Phosphatase: 45 IU/L (ref 44–121)
BUN/Creatinine Ratio: 14 (ref 9–23)
BUN: 7 mg/dL (ref 6–24)
Bilirubin Total: 0.3 mg/dL (ref 0.0–1.2)
CO2: 22 mmol/L (ref 20–29)
Calcium: 9 mg/dL (ref 8.7–10.2)
Chloride: 104 mmol/L (ref 96–106)
Creatinine, Ser: 0.49 mg/dL — ABNORMAL LOW (ref 0.57–1.00)
Globulin, Total: 2.6 g/dL (ref 1.5–4.5)
Glucose: 92 mg/dL (ref 70–99)
Potassium: 4 mmol/L (ref 3.5–5.2)
Sodium: 140 mmol/L (ref 134–144)
Total Protein: 6.8 g/dL (ref 6.0–8.5)
eGFR: 120 mL/min/{1.73_m2} (ref >59)

## 2022-06-10 LAB — CBC WITH DIFFERENTIAL/PLATELET
Basophils Absolute: 0 10*3/uL (ref 0.0–0.2)
Basos: 1 %
EOS (ABSOLUTE): 0.1 10*3/uL (ref 0.0–0.4)
Eos: 1 %
Hematocrit: 39 % (ref 34.0–46.6)
Hemoglobin: 12.3 g/dL (ref 11.1–15.9)
Immature Grans (Abs): 0 10*3/uL (ref 0.0–0.1)
Immature Granulocytes: 0 %
Lymphocytes Absolute: 1.6 10*3/uL (ref 0.7–3.1)
Lymphs: 37 %
MCH: 28.9 pg (ref 26.6–33.0)
MCHC: 31.5 g/dL (ref 31.5–35.7)
MCV: 92 fL (ref 79–97)
Monocytes Absolute: 0.4 10*3/uL (ref 0.1–0.9)
Monocytes: 10 %
Neutrophils Absolute: 2.1 10*3/uL (ref 1.4–7.0)
Neutrophils: 51 %
Platelets: 231 10*3/uL (ref 150–450)
RBC: 4.26 x10E6/uL (ref 3.77–5.28)
RDW: 12 % (ref 11.7–15.4)
WBC: 4.1 10*3/uL (ref 3.4–10.8)

## 2022-06-10 LAB — MICROALBUMIN / CREATININE URINE RATIO
Creatinine, Urine: 77.2 mg/dL
Microalb/Creat Ratio: <4 mg/g creat (ref 0–29)
Microalbumin, Urine: <3 ug/mL

## 2022-06-10 LAB — LIPID PANEL W/O CHOL/HDL RATIO
Cholesterol, Total: 163 mg/dL (ref 100–199)
HDL: 53 mg/dL (ref >39)
LDL Chol Calc (NIH): 98 mg/dL (ref 0–99)
Triglycerides: 62 mg/dL (ref 0–149)
VLDL Cholesterol Cal: 12 mg/dL (ref 5–40)

## 2022-07-27 ENCOUNTER — Other Ambulatory Visit: Payer: Self-pay | Admitting: Internal Medicine

## 2022-08-23 ENCOUNTER — Other Ambulatory Visit: Payer: Self-pay | Admitting: Internal Medicine

## 2022-08-23 DIAGNOSIS — E119 Type 2 diabetes mellitus without complications: Secondary | ICD-10-CM

## 2022-10-26 ENCOUNTER — Other Ambulatory Visit: Payer: Self-pay | Admitting: Internal Medicine

## 2022-11-06 ENCOUNTER — Other Ambulatory Visit: Payer: Self-pay

## 2022-11-17 ENCOUNTER — Ambulatory Visit: Payer: Medicaid Other | Admitting: Internal Medicine

## 2022-11-17 ENCOUNTER — Encounter: Payer: Self-pay | Admitting: Internal Medicine

## 2022-11-17 VITALS — BP 120/68 | HR 73 | Resp 16 | Ht 63.5 in | Wt 183.0 lb

## 2022-11-17 DIAGNOSIS — Z1231 Encounter for screening mammogram for malignant neoplasm of breast: Secondary | ICD-10-CM | POA: Diagnosis not present

## 2022-11-17 DIAGNOSIS — E042 Nontoxic multinodular goiter: Secondary | ICD-10-CM

## 2022-11-17 DIAGNOSIS — E119 Type 2 diabetes mellitus without complications: Secondary | ICD-10-CM

## 2022-11-17 DIAGNOSIS — E782 Mixed hyperlipidemia: Secondary | ICD-10-CM

## 2022-11-17 NOTE — Progress Notes (Signed)
Subjective:    Patient ID: [name], female   DOB: 02-09-1981, 42 y.o.   MRN: [id number]   HPI  Job:  her work permit expired, but her adult son, who just graduated in Lobbyist at [employer] and will start work in [place] soon.  She does have an attorney.    2.  DM:  Sugars running 150 range in the morning fasting.  Her last A1C in November was 6.4%.  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  Walks from parking lot, which is a distance.  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   Was seen by retinal specialist for a bleed, but the bleed was resolved by time she was seen there.   Follows with Dr. Dione Booze.    3.   Elevated LDL:  Still having periods and they are regular.  No hot flashes or night sweats.  She is not using birth control of any sort and did get pregnant in 2021.  She is non fasting today.  Last LDL in November was improved to 98.    4.  Multinodular goiter:  last labs with Dr. Sharl Ma was in June of 2023 and normal.    5.  History of heme positive stool, mild anemia and suboptimal prep for colonoscopy:  she did receive notification from [facility], but just does not want to have this done.  Hemoglobin has been in normal range since 2021.  FIT was negative in 05/2022 with CPE.    Current Meds  Medication Sig   blood glucose meter kit and supplies KIT Check sugars twice daily before meals.   Cholecalciferol (VITAMIN D3 PO) Take one pill daily, 1000 IU   gabapentin (NEURONTIN) 300 MG capsule Take 1 capsule by mouth twice daily   glucose blood test strip Use as instructed   metFORMIN (GLUCOPHAGE) 500 MG tablet TAKE 1 TABLET BY MOUTH TWICE DAILY WITH A MEAL   Prenatal Vit-Fe Fumarate-FA (WESTAB PLUS) 27-1 MG TABS Take 1 tablet by mouth once daily   No Known Allergies   Review of Systems    Objective:   BP 120/68 (BP Location: Left Arm, Patient Position: Sitting, Cuff Size: Normal)   Pulse 73   Resp 16   Ht 5' 3.5" (1.613 m)    Wt 183 lb (83 kg)   BMI 31.91 kg/m   Physical Exam NAD HEENT:  PERRL, EOMI,  Neck:  Supple, No adenopathy.  Multinodular large goiter Chest:  CTA CV:  RRR Grade 2-3/6 SEM unchanged.  Radial and DP pulses normal and equal Abd:  S, NT, No HSM or mass, + BS LE:  No edema Diabetic Foot Exam - Simple   Simple Foot Form Diabetic Foot exam was performed with the following findings: Yes 11/17/2022 12:51 PM  Visual Inspection No deformities, no ulcerations, no other skin breakdown bilaterally: Yes Sensation Testing Intact to touch and monofilament testing bilaterally: Yes Pulse Check Posterior Tibialis and Dorsalis pulse intact bilaterally: Yes Comments       Assessment & Plan   Social issues/residency:  discussed we could set her up with Legal Aid if she ever needs different representation.    2.  DM:  A1C.  As per retinal specialist and Dr. Dione Booze for eyes.    3.  Elevated LDL:  holding statin until she definitively is in menopause.  FLP  4.  Multinodular goiter:  with history of hyperthyroidism:  as per Dr. Sharl Ma, endocrine.  5.  HM:  schedule mammogram.

## 2022-11-18 LAB — LIPID PANEL W/O CHOL/HDL RATIO
Cholesterol, Total: 177 mg/dL (ref 100–199)
HDL: 56 mg/dL (ref >39)
LDL Chol Calc (NIH): 105 mg/dL — ABNORMAL HIGH (ref 0–99)
Triglycerides: 89 mg/dL (ref 0–149)
VLDL Cholesterol Cal: 16 mg/dL (ref 5–40)

## 2022-11-18 LAB — HGB A1C W/O EAG: Hgb A1c MFr Bld: 6.1 % — ABNORMAL HIGH (ref 4.8–5.6)

## 2022-11-20 ENCOUNTER — Ambulatory Visit (HOSPITAL_COMMUNITY)
Admission: EM | Admit: 2022-11-20 | Discharge: 2022-11-20 | Disposition: A | Discharge status: Home / Self Care | Payer: Medicaid Other | Attending: Emergency Medicine | Admitting: Emergency Medicine

## 2022-11-20 ENCOUNTER — Encounter (HOSPITAL_COMMUNITY): Payer: Self-pay

## 2022-11-20 DIAGNOSIS — R519 Headache, unspecified: Secondary | ICD-10-CM | POA: Insufficient documentation

## 2022-11-20 DIAGNOSIS — J069 Acute upper respiratory infection, unspecified: Secondary | ICD-10-CM | POA: Diagnosis present

## 2022-11-20 DIAGNOSIS — Z1152 Encounter for screening for COVID-19: Secondary | ICD-10-CM | POA: Diagnosis not present

## 2022-11-20 MED ORDER — KETOROLAC TROMETHAMINE 30 MG/ML IJ SOLN
INTRAMUSCULAR | Status: AC
  Filled 2022-11-20: qty 1

## 2022-11-20 MED ORDER — KETOROLAC TROMETHAMINE 30 MG/ML IJ SOLN
30.0000 mg | Freq: Once | INTRAMUSCULAR | Status: AC
Start: 2022-11-20 — End: 2022-11-20
  Administered 2022-11-20: 30 mg via INTRAMUSCULAR

## 2022-11-20 MED ORDER — GUAIFENESIN ER 600 MG PO TB12: 1200.0000 mg | ORAL_TABLET | Freq: Two times a day (BID) | ORAL | 0 refills | Status: AC

## 2022-11-20 MED ORDER — SALINE SPRAY 0.65 % NA SOLN: 1.0000 | NASAL | 0 refills | Status: DC | PRN

## 2022-11-20 MED ORDER — IBUPROFEN 800 MG PO TABS
800.0000 mg | ORAL_TABLET | Freq: Three times a day (TID) | ORAL | 0 refills | Status: DC
Start: 2022-11-20 — End: 2022-12-21

## 2022-11-20 NOTE — Discharge Instructions (Signed)
Overall your symptoms are consistent with a viral upper respiratory infection.  We have given you Toradol in clinic, you can take Tylenol as needed for pain.  Later tonight, you can take ibuprofen 800 mg, you can take this every 8 hours.  Please ensure you are staying hydrated with at least 64 ounces of water daily.  You can do the Mucinex to help loosen your secretions, and over-the-counter saline nasal spray, and sleeping with a humidifier.   Please return to clinic or follow-up with your primary care provider if you have no improvement in symptoms over the next week.

## 2022-11-20 NOTE — ED Provider Notes (Signed)
[facility]    CSN: [id number] Arrival date & time: 11/20/22  0940      History   Chief Complaint Chief Complaint  Patient presents with   Headache    HPI [name] is a 42 y.o. female.   Patient presents to clinic for complaints of headache, sneezing, nasal congestion and hot and cold chills that started yesterday.  She denies cough, chest pain or shortness of breath.  Reports body aches overall.  Has not taken Tylenol or ibuprofen, did try Claritin, this did not help very much.  Denies sore throat, denies recent sick contacts.  She has monthly menses and is confident that she is not pregnant.   The history is provided by the patient and medical records.  Headache Associated symptoms: congestion, drainage and sinus pressure   Associated symptoms: no abdominal pain, no cough, no fever and no sore throat     Past Medical History:  Diagnosis Date   Biological false positive RPR test 08/23/2016   FTA-ABS negative   Diabetes mellitus without complication (HCC) 2011   Dyslipidemia 04/27/2017   Gestational diabetes    Hyperthyroidism    Positive PPD 2016   + PPD :  history of BCG as well.  Was unable to treat for latent when pregnant.  Sent for CXR 12.2017, which was negative for active TB.  To go to [facility] for treatment of latent.  07/2016 documented   Thyroid nodule 02/05/2017   Thyroid nodule with FNA with atypia of unknown significance and hyperthyroidism (Grave's Disease).  Was seen by Dr. Durene Fruits [facility].  Apparently deciding whether to undergo thyroidectomy vs medication  (methimazole or RAI treatment per patient.) She elected to take Methimazole.  Stopped with pregnancy in 10/2019 and thyroid testing okay   Visual acuity reduced 2008   Wears glasses only for nighttime driving    Patient Active Problem List   Diagnosis Date Noted   Dyslipidemia 04/27/2017   Thyroid nodule 02/05/2017   Cervical lesion 02/05/2017   Biological false positive RPR test  08/23/2016   Hyperthyroidism 04/06/2016   Heart murmur, systolic 04/06/2016   Hyperlipidemia 02/03/2016   Controlled type 2 diabetes mellitus without complication, without long-term current use of insulin (HCC) 06/26/2015   Arm pain 08/06/2014   Positive PPD 07/13/2014   Complex regional pain syndrome 05/16/2014   Radial nerve disease 03/12/2014    Past Surgical History:  Procedure Laterality Date   CESAREAN SECTION N/A 03/04/2015   Procedure: CESAREAN SECTION;  Surgeon: Kathreen Cosier, MD;  Location: [facility];  Service: Obstetrics;  Laterality: N/A;   DILATION AND EVACUATION N/A 10/18/2019   Procedure: DILATATION AND EVACUATION;  Surgeon: Catalina Antigua, MD;  Location: [facility];  Service: Gynecology;  Laterality: N/A;   FNA thyroid nodule  06/22/2018   path equivocal--cells of unknown signficance.  Followed by [facility] and being treated with Methimazole for hyperthryroidism.    OB History     Gravida  7   Para  5   Term  5   Preterm      AB  1   Living  1      SAB  1   IAB      Ectopic      Multiple  0   Live Births  1            Home Medications    Prior to Admission medications   Medication Sig Start Date End Date Taking? Authorizing Provider  Cholecalciferol (VITAMIN D3 PO) Take one pill daily, 1000 IU   Yes [provider]  gabapentin (NEURONTIN) 300 MG capsule Take 1 capsule by mouth twice daily 11/06/22  Yes Julieanne Manson, MD  guaiFENesin (MUCINEX) 600 MG 12 hr tablet Take 2 tablets (1,200 mg total) by mouth 2 (two) times daily for 5 days. 11/20/22 11/25/22 Yes Rinaldo Ratel, [place] N, FNP  ibuprofen (ADVIL) 800 MG tablet Take 1 tablet (800 mg total) by mouth 3 (three) times daily. 11/20/22  Yes Rinaldo Ratel, [place] N, FNP  metFORMIN (GLUCOPHAGE) 500 MG tablet TAKE 1 TABLET BY MOUTH TWICE DAILY WITH A MEAL 08/24/22  Yes Julieanne Manson, MD  Prenatal Vit-Fe Fumarate-FA (WESTAB PLUS) 27-1 MG TABS Take 1 tablet by mouth  once daily 11/06/22  Yes Julieanne Manson, MD  sodium chloride (OCEAN) 0.65 % SOLN nasal spray Place 1 spray into both nostrils as needed for congestion. 11/20/22  Yes Rinaldo Ratel, [place] N, FNP  blood glucose meter kit and supplies KIT Check sugars twice daily before meals. 04/27/17   Julieanne Manson, MD  glucose blood test strip Use as instructed 04/27/17   Julieanne Manson, MD    Family History Family History  Problem Relation Age of Onset   Stroke Paternal Grandmother     Social History Social History   Tobacco Use   Smoking status: Never    Passive exposure: Never   Smokeless tobacco: Never  Vaping Use   Vaping Use: Never used  Substance Use Topics   Alcohol use: No    Alcohol/week: 0.0 standard drinks of alcohol   Drug use: No     Allergies   Patient has no known allergies.   Review of Systems Review of Systems  Constitutional:  Positive for chills. Negative for fever.  HENT:  Positive for congestion, postnasal drip, rhinorrhea, sinus pressure and sinus pain. Negative for sore throat and voice change.   Respiratory:  Negative for cough and shortness of breath.   Cardiovascular:  Negative for chest pain.  Gastrointestinal:  Negative for abdominal pain.  Genitourinary:  Negative for dysuria.  Neurological:  Positive for headaches.     Physical Exam Triage Vital Signs ED Triage Vitals  Enc Vitals Group     BP 11/20/22 1013 121/82     Pulse Rate 11/20/22 1013 85     Resp 11/20/22 1013 18     Temp 11/20/22 1013 98.5 F (36.9 C)     Temp Source 11/20/22 1013 Oral     SpO2 11/20/22 1013 97 %     Weight --      Height --      Head Circumference --      Peak Flow --      Pain Score 11/20/22 1011 10     Pain Loc --      Pain Edu? --      Excl. in GC? --    No data found.  Updated Vital Signs BP 121/82 (BP Location: Left Arm)   Pulse 85   Temp 98.5 F (36.9 C) (Oral)   Resp 18   LMP 11/03/2022 (Approximate)   SpO2 97%   Visual Acuity Right  Eye Distance:   Left Eye Distance:   Bilateral Distance:    Right Eye Near:   Left Eye Near:    Bilateral Near:     Physical Exam Vitals and nursing note reviewed.  Constitutional:      Appearance: Normal appearance. She is well-developed.  HENT:     Head: Normocephalic  and atraumatic.     Right Ear: External ear normal.     Left Ear: External ear normal.     Nose: Congestion and rhinorrhea present.     Right Turbinates: Enlarged and swollen.     Left Turbinates: Enlarged and swollen.     Right Sinus: Maxillary sinus tenderness present.     Left Sinus: Maxillary sinus tenderness present.     Mouth/Throat:     Mouth: Mucous membranes are moist.     Pharynx: Oropharynx is clear.  Eyes:     General: No scleral icterus.    Extraocular Movements: Extraocular movements intact.     Pupils: Pupils are equal, round, and reactive to light.  Cardiovascular:     Rate and Rhythm: Normal rate and regular rhythm.     Heart sounds: Normal heart sounds. No murmur heard. Pulmonary:     Effort: Pulmonary effort is normal. No respiratory distress.     Breath sounds: Normal breath sounds.  Musculoskeletal:        General: No swelling. Normal range of motion.     Cervical back: Normal range of motion and neck supple.  Skin:    General: Skin is warm and dry.  Neurological:     Mental Status: She is alert and oriented to person, place, and time.     GCS: GCS eye subscore is 4. GCS verbal subscore is 5. GCS motor subscore is 6.     Cranial Nerves: No cranial nerve deficit, dysarthria or facial asymmetry.  Psychiatric:        Mood and Affect: Mood normal.        Behavior: Behavior normal.      UC Treatments / Results  Labs (all labs ordered are listed, but only abnormal results are displayed) Labs Reviewed  SARS CORONAVIRUS 2 (TAT 6-24 HRS)    EKG   Radiology No results found.  Procedures Procedures (including critical care time)  Medications Ordered in UC Medications   ketorolac (TORADOL) 30 MG/ML injection 30 mg (has no administration in time range)    Initial Impression / Assessment and Plan / UC Course  I have reviewed the triage vital signs and the nursing notes.  Pertinent labs & imaging results that were available during my care of the patient were reviewed by me and considered in my medical decision making (see chart for details).  Vitals and triage reviewed, patient is hemodynamically stable.  Lungs vesicular posteriorly.  Patient reports sinus congestion and pressure, turbinates enlarged and swollen with maxillary sinus tenderness.  Symptoms present since yesterday. Postnasal drip.  Suspect a viral upper respiratory infection, given Toradol injection in clinic for headache. PERRLA, GCS 15.  Swab for COVID-19, staff will contact if positive.  Advised symptomatic management.  Follow-up and return precautions given, no questions at this time.     Final Clinical Impressions(s) / UC Diagnoses   Final diagnoses:  Acute nonintractable headache, unspecified headache type  Viral URI     Discharge Instructions      Overall your symptoms are consistent with a viral upper respiratory infection.  We have given you Toradol in clinic, you can take Tylenol as needed for pain.  Later tonight, you can take ibuprofen 800 mg, you can take this every 8 hours.  Please ensure you are staying hydrated with at least 64 ounces of water daily.  You can do the Mucinex to help loosen your secretions, and over-the-counter saline nasal spray, and sleeping with a humidifier.   Please  return to clinic or follow-up with your primary care provider if you have no improvement in symptoms over the next week.      ED Prescriptions     Medication Sig Dispense Auth. Provider   ibuprofen (ADVIL) 800 MG tablet Take 1 tablet (800 mg total) by mouth 3 (three) times daily. 21 tablet Rinaldo Ratel, [place] N, [place]   guaiFENesin (MUCINEX) 600 MG 12 hr tablet Take 2 tablets (1,200 mg  total) by mouth 2 (two) times daily for 5 days. 20 tablet Rinaldo Ratel, [place] N, [place]   sodium chloride (OCEAN) 0.65 % SOLN nasal spray Place 1 spray into both nostrils as needed for congestion. 15 mL Taden Witter, [place] N, FNP      PDMP not reviewed this encounter.   Kaynen Minner, [place] N, [place] 11/20/22 1045

## 2022-11-20 NOTE — ED Triage Notes (Signed)
Patient having headaches, sneezing, and nasal congestion. No known sick exposure, no known history of allergies. No new foods, meds or products. No recent travel.  Onset yesterday   Patient has tried Claritin with no relief.

## 2022-11-21 LAB — SARS CORONAVIRUS 2 (TAT 6-24 HRS): SARS Coronavirus 2: NEGATIVE

## 2022-12-01 ENCOUNTER — Other Ambulatory Visit: Payer: Self-pay | Admitting: Internal Medicine

## 2022-12-21 ENCOUNTER — Ambulatory Visit (HOSPITAL_COMMUNITY)
Admission: EM | Admit: 2022-12-21 | Discharge: 2022-12-21 | Disposition: A | Discharge status: Home / Self Care | Payer: Medicaid Other | Attending: Emergency Medicine | Admitting: Emergency Medicine

## 2022-12-21 ENCOUNTER — Encounter (HOSPITAL_COMMUNITY): Payer: Self-pay | Admitting: *Deleted

## 2022-12-21 ENCOUNTER — Other Ambulatory Visit: Payer: Self-pay

## 2022-12-21 DIAGNOSIS — J029 Acute pharyngitis, unspecified: Secondary | ICD-10-CM | POA: Insufficient documentation

## 2022-12-21 DIAGNOSIS — H9202 Otalgia, left ear: Secondary | ICD-10-CM | POA: Diagnosis not present

## 2022-12-21 LAB — POCT RAPID STREP A (OFFICE): Rapid Strep A Screen: NEGATIVE

## 2022-12-21 MED ORDER — LIDOCAINE VISCOUS HCL 2 % MT SOLN: 15.0000 mL | OROMUCOSAL | 0 refills | Status: DC | PRN

## 2022-12-21 MED ORDER — IBUPROFEN 800 MG PO TABS: 800.0000 mg | ORAL_TABLET | Freq: Three times a day (TID) | ORAL | 0 refills | Status: DC

## 2022-12-21 MED ORDER — FLUTICASONE PROPIONATE 50 MCG/ACT NA SUSP: 2.0000 | Freq: Every day | NASAL | 2 refills | Status: AC

## 2022-12-21 NOTE — ED Provider Notes (Signed)
[facility]    CSN: [id number] Arrival date & time: 12/21/22  4098      History   Chief Complaint Chief Complaint  Patient presents with   Sore Throat    HPI [name] is a 42 y.o. female.  5 day history of sore throat. Rates 10/10 Also having left ear pain Tactile fever and chills No cough, congestion, abd pain, NVD, rash Using ibuprofen once daily  Daughter was sick last week with sore throat.  Past Medical History:  Diagnosis Date   Biological false positive RPR test 08/23/2016   FTA-ABS negative   Diabetes mellitus without complication (HCC) 2011   Dyslipidemia 04/27/2017   Gestational diabetes    Hyperthyroidism    Positive PPD 2016   + PPD :  history of BCG as well.  Was unable to treat for latent when pregnant.  Sent for CXR 12.2017, which was negative for active TB.  To go to [facility] for treatment of latent.  07/2016 documented   Thyroid nodule 02/05/2017   Thyroid nodule with FNA with atypia of unknown significance and hyperthyroidism (Grave's Disease).  Was seen by Dr. Durene Fruits [facility].  Apparently deciding whether to undergo thyroidectomy vs medication  (methimazole or RAI treatment per patient.) She elected to take Methimazole.  Stopped with pregnancy in 10/2019 and thyroid testing okay   Visual acuity reduced 2008   Wears glasses only for nighttime driving    Patient Active Problem List   Diagnosis Date Noted   Dyslipidemia 04/27/2017   Thyroid nodule 02/05/2017   Cervical lesion 02/05/2017   Biological false positive RPR test 08/23/2016   Hyperthyroidism 04/06/2016   Heart murmur, systolic 04/06/2016   Hyperlipidemia 02/03/2016   Controlled type 2 diabetes mellitus without complication, without long-term current use of insulin (HCC) 06/26/2015   Arm pain 08/06/2014   Positive PPD 07/13/2014   Complex regional pain syndrome 05/16/2014   Radial nerve disease 03/12/2014    Past Surgical History:  Procedure Laterality Date    CESAREAN SECTION N/A 03/04/2015   Procedure: CESAREAN SECTION;  Surgeon: Kathreen Cosier, MD;  Location: [facility];  Service: Obstetrics;  Laterality: N/A;   DILATION AND EVACUATION N/A 10/18/2019   Procedure: DILATATION AND EVACUATION;  Surgeon: Catalina Antigua, MD;  Location: [facility];  Service: Gynecology;  Laterality: N/A;   FNA thyroid nodule  06/22/2018   path equivocal--cells of unknown signficance.  Followed by [facility] and being treated with Methimazole for hyperthryroidism.    OB History     Gravida  7   Para  5   Term  5   Preterm      AB  1   Living  1      SAB  1   IAB      Ectopic      Multiple  0   Live Births  1            Home Medications    Prior to Admission medications   Medication Sig Start Date End Date Taking? Authorizing Provider  Cholecalciferol (VITAMIN D3 PO) Take one pill daily, 1000 IU   Yes [provider]  fluticasone (FLONASE) 50 MCG/ACT nasal spray Place 2 sprays into both nostrils daily. 12/21/22  Yes Khole Branch, Lurena Joiner, PA-C  gabapentin (NEURONTIN) 300 MG capsule Take 1 capsule by mouth twice daily 12/07/22  Yes Julieanne Manson, MD  ibuprofen (ADVIL) 800 MG tablet Take 1 tablet (800 mg total) by mouth 3 (three) times daily.  12/21/22  Yes Kaesha Kirsch, PA-C  lidocaine (XYLOCAINE) 2 % solution Use as directed 15 mLs in the mouth or throat every 3 (three) hours as needed for mouth pain. Swish/gargle and spit out 12/21/22  Yes Matty Deamer, Lurena Joiner, PA-C  metFORMIN (GLUCOPHAGE) 500 MG tablet TAKE 1 TABLET BY MOUTH TWICE DAILY WITH A MEAL 08/24/22  Yes Julieanne Manson, MD  Prenatal Vit-Fe Fumarate-FA (WESTAB PLUS) 27-1 MG TABS Take 1 tablet by mouth once daily 11/06/22  Yes Julieanne Manson, MD  blood glucose meter kit and supplies KIT Check sugars twice daily before meals. 04/27/17   Julieanne Manson, MD  glucose blood test strip Use as instructed 04/27/17   Julieanne Manson, MD  sodium chloride  (OCEAN) 0.65 % SOLN nasal spray Place 1 spray into both nostrils as needed for congestion. 11/20/22   Garrison, [place] N, FNP    Family History Family History  Problem Relation Age of Onset   Stroke Paternal Grandmother     Social History Social History   Tobacco Use   Smoking status: Never    Passive exposure: Never   Smokeless tobacco: Never  Vaping Use   Vaping Use: Never used  Substance Use Topics   Alcohol use: No    Alcohol/week: 0.0 standard drinks of alcohol   Drug use: No     Allergies   Patient has no known allergies.   Review of Systems Review of Systems As per HPI  Physical Exam Triage Vital Signs ED Triage Vitals  Enc Vitals Group     BP 12/21/22 1029 125/88     Pulse Rate 12/21/22 1029 78     Resp 12/21/22 1029 18     Temp 12/21/22 1029 98 F (36.7 C)     Temp Source 12/21/22 1029 Oral     SpO2 12/21/22 1029 98 %     Weight --      Height --      Head Circumference --      Peak Flow --      Pain Score 12/21/22 1030 10     Pain Loc --      Pain Edu? --      Excl. in GC? --    No data found.  Updated Vital Signs BP 125/88   Pulse 78   Temp 98 F (36.7 C) (Oral)   Resp 18   LMP 11/20/2022 (Approximate)   SpO2 98%   Physical Exam Vitals and nursing note reviewed.  HENT:     Right Ear: Tympanic membrane and ear canal normal.     Left Ear: Tympanic membrane and ear canal normal.     Nose: Congestion present. No rhinorrhea.     Mouth/Throat:     Mouth: Mucous membranes are moist.     Pharynx: Oropharynx is clear. Posterior oropharyngeal erythema present. No oropharyngeal exudate.     Tonsils: 0 on the right. 0 on the left.     Comments: Normal phonation, tolerates secretions Eyes:     Conjunctiva/sclera: Conjunctivae normal.  Cardiovascular:     Rate and Rhythm: Normal rate and regular rhythm.     Pulses: Normal pulses.     Heart sounds: Normal heart sounds.  Pulmonary:     Effort: Pulmonary effort is normal.     Breath sounds:  Normal breath sounds.  Musculoskeletal:     Cervical back: Normal range of motion.  Lymphadenopathy:     Cervical: No cervical adenopathy.  Skin:    General: Skin is warm and  dry.  Neurological:     Mental Status: She is alert and oriented to person, place, and time.     UC Treatments / Results  Labs (all labs ordered are listed, but only abnormal results are displayed) Labs Reviewed  CULTURE, GROUP A STREP [facility])  POCT RAPID STREP A (OFFICE)    EKG  Radiology No results found.  Procedures Procedures (including critical care time)  Medications Ordered in UC Medications - No data to display  Initial Impression / Assessment and Plan / UC Course  I have reviewed the triage vital signs and the nursing notes.  Pertinent labs & imaging results that were available during my care of the patient were reviewed by me and considered in my medical decision making (see chart for details).  Rapid strep negative. Culture pending Discussed viral etiology, no indication for antibiotics at this time Advised to use ibu and tylenol more than once daily. Lidocaine gargle and spit. Nasal spray for ear pressure. Work note provided, return precautions discussed  Final Clinical Impressions(s) / UC Diagnoses   Final diagnoses:  Viral pharyngitis  Acute otalgia, left     Discharge Instructions      Take ibuprofen 800 mg and tylenol 650 mg, alternate between the two every 4-6 hours.   Use the once daily nasal spray to reduce ear pain  You can gargle with the lidocaine and spit out. This should help to numb the throat  Please allow a few more days for symptoms to improve. Return if needed     ED Prescriptions     Medication Sig Dispense Auth. Provider   fluticasone (FLONASE) 50 MCG/ACT nasal spray Place 2 sprays into both nostrils daily. 9.9 mL Rozina Pointer, PA-C   ibuprofen (ADVIL) 800 MG tablet Take 1 tablet (800 mg total) by mouth 3 (three) times daily. 21 tablet Zelma Mazariego,  Nakenya Theall, PA-C   lidocaine (XYLOCAINE) 2 % solution Use as directed 15 mLs in the mouth or throat every 3 (three) hours as needed for mouth pain. Swish/gargle and spit out 100 mL Winfrey Chillemi, Lurena Joiner, PA-C      PDMP not reviewed this encounter.   Daelyn Mozer, Lurena Joiner, [place] 12/21/22 1153

## 2022-12-21 NOTE — Discharge Instructions (Signed)
Take ibuprofen 800 mg and tylenol 650 mg, alternate between the two every 4-6 hours.   Use the once daily nasal spray to reduce ear pain  You can gargle with the lidocaine and spit out. This should help to numb the throat  Please allow a few more days for symptoms to improve. Return if needed

## 2022-12-21 NOTE — ED Triage Notes (Signed)
C/O sore throat and tactile fever with chills x5 days. Has been taking IBU 800mg  and Tyl.

## 2022-12-23 LAB — CULTURE, GROUP A STREP (THRC)

## 2023-02-01 ENCOUNTER — Other Ambulatory Visit: Payer: Self-pay | Admitting: Internal Medicine

## 2023-02-02 ENCOUNTER — Ambulatory Visit: Payer: Medicaid Other | Admitting: Internal Medicine

## 2023-02-02 DIAGNOSIS — U071 COVID-19: Secondary | ICD-10-CM | POA: Diagnosis not present

## 2023-02-02 DIAGNOSIS — R059 Cough, unspecified: Secondary | ICD-10-CM | POA: Diagnosis not present

## 2023-02-02 LAB — POC COVID19 BINAXNOW: SARS Coronavirus 2 Ag: POSITIVE — AB

## 2023-02-02 MED ORDER — PAXLOVID (300/100) 20 X 150 MG & 10 X 100MG PO TBPK: ORAL_TABLET | ORAL | 0 refills | Status: DC

## 2023-02-02 NOTE — Progress Notes (Signed)
Notify may have nausea, metal taste, diarrhea, abdominal cramping with the medication that will go away when she is done.

## 2023-02-02 NOTE — Progress Notes (Signed)
Patient is experiencing cough, congestions, body aches, and headache. Symptoms started 02/01/2023.  Patient has tested positive for COVID.

## 2023-02-08 ENCOUNTER — Other Ambulatory Visit (INDEPENDENT_AMBULATORY_CARE_PROVIDER_SITE_OTHER): Payer: Medicaid Other

## 2023-02-08 DIAGNOSIS — U071 COVID-19: Secondary | ICD-10-CM | POA: Diagnosis not present

## 2023-02-08 LAB — POC COVID19 BINAXNOW: SARS Coronavirus 2 Ag: NEGATIVE

## 2023-02-09 ENCOUNTER — Other Ambulatory Visit: Payer: Self-pay | Admitting: Internal Medicine

## 2023-02-09 DIAGNOSIS — E042 Nontoxic multinodular goiter: Secondary | ICD-10-CM

## 2023-03-12 ENCOUNTER — Ambulatory Visit: Admission: RE | Admit: 2023-03-12 | Payer: Medicaid Other | Source: Ambulatory Visit

## 2023-03-12 DIAGNOSIS — Z1231 Encounter for screening mammogram for malignant neoplasm of breast: Secondary | ICD-10-CM

## 2023-03-18 ENCOUNTER — Other Ambulatory Visit: Payer: Self-pay | Admitting: Internal Medicine

## 2023-03-18 DIAGNOSIS — R928 Other abnormal and inconclusive findings on diagnostic imaging of breast: Secondary | ICD-10-CM

## 2023-03-26 ENCOUNTER — Inpatient Hospital Stay: Admission: RE | Admit: 2023-03-26 | Payer: Medicaid Other | Source: Ambulatory Visit

## 2023-04-03 ENCOUNTER — Other Ambulatory Visit: Payer: Self-pay | Admitting: Internal Medicine

## 2023-04-03 DIAGNOSIS — E119 Type 2 diabetes mellitus without complications: Secondary | ICD-10-CM

## 2023-05-21 ENCOUNTER — Other Ambulatory Visit (INDEPENDENT_AMBULATORY_CARE_PROVIDER_SITE_OTHER): Payer: Medicaid Other

## 2023-05-21 DIAGNOSIS — E119 Type 2 diabetes mellitus without complications: Secondary | ICD-10-CM

## 2023-05-21 DIAGNOSIS — Z0000 Encounter for general adult medical examination without abnormal findings: Secondary | ICD-10-CM

## 2023-05-21 DIAGNOSIS — E782 Mixed hyperlipidemia: Secondary | ICD-10-CM

## 2023-05-21 DIAGNOSIS — E0590 Thyrotoxicosis, unspecified without thyrotoxic crisis or storm: Secondary | ICD-10-CM

## 2023-05-21 DIAGNOSIS — Z79899 Other long term (current) drug therapy: Secondary | ICD-10-CM

## 2023-05-22 LAB — CBC WITH DIFFERENTIAL/PLATELET
Basophils Absolute: 0 10*3/uL (ref 0.0–0.2)
Basos: 1 %
EOS (ABSOLUTE): 0.1 10*3/uL (ref 0.0–0.4)
Eos: 2 %
Hematocrit: 38.9 % (ref 34.0–46.6)
Hemoglobin: 12.1 g/dL (ref 11.1–15.9)
Immature Grans (Abs): 0 10*3/uL (ref 0.0–0.1)
Immature Granulocytes: 0 %
Lymphocytes Absolute: 1.3 10*3/uL (ref 0.7–3.1)
Lymphs: 39 %
MCH: 28.8 pg (ref 26.6–33.0)
MCHC: 31.1 g/dL — ABNORMAL LOW (ref 31.5–35.7)
MCV: 93 fL (ref 79–97)
Monocytes Absolute: 0.4 10*3/uL (ref 0.1–0.9)
Monocytes: 10 %
Neutrophils Absolute: 1.6 10*3/uL (ref 1.4–7.0)
Neutrophils: 48 %
Platelets: 232 10*3/uL (ref 150–450)
RBC: 4.2 x10E6/uL (ref 3.77–5.28)
RDW: 12 % (ref 11.7–15.4)
WBC: 3.4 10*3/uL (ref 3.4–10.8)

## 2023-05-22 LAB — MICROALBUMIN / CREATININE URINE RATIO
Creatinine, Urine: 90.6 mg/dL
Microalb/Creat Ratio: 3 mg/g{creat} (ref 0–29)
Microalbumin, Urine: 3.1 ug/mL

## 2023-05-22 LAB — COMPREHENSIVE METABOLIC PANEL
ALT: 7 [IU]/L (ref 0–32)
AST: 11 [IU]/L (ref 0–40)
Albumin: 4 g/dL (ref 3.9–4.9)
Alkaline Phosphatase: 50 [IU]/L (ref 44–121)
BUN/Creatinine Ratio: 13 (ref 9–23)
BUN: 6 mg/dL (ref 6–24)
Bilirubin Total: 0.3 mg/dL (ref 0.0–1.2)
CO2: 24 mmol/L (ref 20–29)
Calcium: 8.9 mg/dL (ref 8.7–10.2)
Chloride: 105 mmol/L (ref 96–106)
Creatinine, Ser: 0.48 mg/dL — ABNORMAL LOW (ref 0.57–1.00)
Globulin, Total: 2.7 g/dL (ref 1.5–4.5)
Glucose: 81 mg/dL (ref 70–99)
Potassium: 4 mmol/L (ref 3.5–5.2)
Sodium: 141 mmol/L (ref 134–144)
Total Protein: 6.7 g/dL (ref 6.0–8.5)
eGFR: 120 mL/min/{1.73_m2} (ref >59)

## 2023-05-22 LAB — LIPID PANEL W/O CHOL/HDL RATIO
Cholesterol, Total: 167 mg/dL (ref 100–199)
HDL: 55 mg/dL (ref >39)
LDL Chol Calc (NIH): 101 mg/dL — ABNORMAL HIGH (ref 0–99)
Triglycerides: 54 mg/dL (ref 0–149)
VLDL Cholesterol Cal: 11 mg/dL (ref 5–40)

## 2023-05-22 LAB — HEMOGLOBIN A1C
Est. average glucose Bld gHb Est-mCnc: 128 mg/dL
Hgb A1c MFr Bld: 6.1 % — ABNORMAL HIGH (ref 4.8–5.6)

## 2023-05-22 LAB — TSH: TSH: 0.275 u[IU]/mL — ABNORMAL LOW (ref 0.450–4.500)

## 2023-05-25 ENCOUNTER — Other Ambulatory Visit: Payer: Self-pay | Admitting: Internal Medicine

## 2023-05-25 ENCOUNTER — Ambulatory Visit: Payer: Medicaid Other | Admitting: Internal Medicine

## 2023-05-25 ENCOUNTER — Encounter: Payer: Self-pay | Admitting: Internal Medicine

## 2023-05-25 VITALS — BP 104/68 | HR 77 | Resp 16 | Ht 63.5 in | Wt 180.0 lb

## 2023-05-25 DIAGNOSIS — Z23 Encounter for immunization: Secondary | ICD-10-CM

## 2023-05-25 DIAGNOSIS — E0590 Thyrotoxicosis, unspecified without thyrotoxic crisis or storm: Secondary | ICD-10-CM

## 2023-05-25 DIAGNOSIS — E782 Mixed hyperlipidemia: Secondary | ICD-10-CM

## 2023-05-25 DIAGNOSIS — Z0000 Encounter for general adult medical examination without abnormal findings: Secondary | ICD-10-CM | POA: Diagnosis not present

## 2023-05-25 DIAGNOSIS — E119 Type 2 diabetes mellitus without complications: Secondary | ICD-10-CM

## 2023-05-25 DIAGNOSIS — E042 Nontoxic multinodular goiter: Secondary | ICD-10-CM

## 2023-05-25 DIAGNOSIS — E785 Hyperlipidemia, unspecified: Secondary | ICD-10-CM

## 2023-05-25 DIAGNOSIS — Z124 Encounter for screening for malignant neoplasm of cervix: Secondary | ICD-10-CM

## 2023-05-25 DIAGNOSIS — J302 Other seasonal allergic rhinitis: Secondary | ICD-10-CM

## 2023-05-25 MED ORDER — LANCETS MISC. MISC: 8 refills | Status: AC

## 2023-05-25 MED ORDER — BLOOD GLUCOSE MONITORING SUPPL DEVI: 0 refills | Status: AC

## 2023-05-25 MED ORDER — CALCIUM 250 MG PO CAPS: ORAL_CAPSULE | ORAL | Status: DC

## 2023-05-25 MED ORDER — CETIRIZINE HCL 10 MG PO TABS: 10.0000 mg | ORAL_TABLET | Freq: Every day | ORAL | 11 refills | Status: DC

## 2023-05-25 MED ORDER — BLOOD GLUCOSE TEST VI STRP: ORAL_STRIP | 8 refills | Status: AC

## 2023-05-25 MED ORDER — LANCET DEVICE MISC: 0 refills | Status: AC

## 2023-05-25 MED ORDER — IBUPROFEN 800 MG PO TABS: ORAL_TABLET | ORAL | 1 refills | Status: DC

## 2023-05-25 NOTE — Progress Notes (Signed)
Subjective:    Patient ID: [name], female   DOB: 01-12-1981, 42 y.o.   MRN: [id number]   HPI  CPE with pap  1.  Pap:  Last in 2021 and normal.  Always normal.    2.  Mammogram:  Asymmetry in both breasts in 02/2023.  She has an appt for 12/3 to go back for more views and possibly [place].  Normal mammogram in 2020.  No family history of breast cancer.    3.  Osteoprevention:  Still having periods, but she is also hyperthyroid.  Takes 1000 international   Vitamin D3 daily, but not calcium.  1 cup milk daily.  She is not taking supplemental calcium.  Walks 30 minutes daily.    4.  Guaiac Cards/FIT:  Last 11.2023 and negative for blood following suboptimal colonoscopy in 2020.    5.  Colonoscopy:  Had colonoscopy with Dr. Myrtie Neither, GI in 05/2019, but could only advance to transverse colon due to suboptimal prep.  Was to repeat after better prep, but did not.  Done due to Heme + stool.  FIT last year was negative.    6.  Immunizations:  Has not had influenza or COVID vaccine this year.   Immunization History  Administered Date(s) Administered   Influenza Inj Mdck Quad Pf 04/27/2017   Influenza Split 04/26/2019   Influenza-Unspecified 07/03/2015, 06/07/2018, 05/09/2021, 05/13/2022   Moderna Sars-Covid-2 Vaccination 07/25/2019, 08/24/2019, 06/25/2020   Pneumococcal Polysaccharide-23 04/06/2016   Tdap 03/06/2015     7.  Glucose/Cholesterol:  Maintaining good glucose control with A1C in 6.1%, but LDL too high.  Holding on statin as does not want to use pregnancy prevention.  Not clear she has had a conversation with her husband about her chronic health issues untreated and possible complications vs using at least consistent use of condoms.    8.  Hyperthyroidism/Graves/multinodular goiter:  after SAB in 09/2019, methimazole was discontinued.  She has chosen in past not to use prevention for pregnancy, so may be reason she has not been restarted or treated with RAI.  Unable to clarify in  the notes that can be seen in Care Everywhere from Dr. Sharl Ma, Endocrinology.  She awakened this morning with left eye dryness--like "rocks in eye" with mild redness.  Upper eyelid a bit swollen  No issue with vision out of the left.  No diplopia. See ROS  Current Meds  Medication Sig   Cholecalciferol (VITAMIN D3 PO) Take one pill daily, 1000 IU   fluticasone (FLONASE) 50 MCG/ACT nasal spray Place 2 sprays into both nostrils daily.   gabapentin (NEURONTIN) 300 MG capsule Take 1 capsule by mouth twice daily   ibuprofen (ADVIL) 800 MG tablet Take 1 tablet (800 mg total) by mouth 3 (three) times daily.   metFORMIN (GLUCOPHAGE) 500 MG tablet TAKE 1 TABLET BY MOUTH TWICE DAILY WITH A MEAL   No Known Allergies  Past Medical History:  Diagnosis Date   Biological false positive RPR test 08/23/2016   FTA-ABS negative   Diabetes mellitus without complication (HCC) 2011   Dyslipidemia 04/27/2017   Gestational diabetes    Hyperthyroidism    Positive PPD 2016   + PPD :  history of BCG as well.  Was unable to treat for latent when pregnant.  Sent for CXR 12.2017, which was negative for active TB.  To go to PHD for treatment of latent.  07/2016 documented   Thyroid nodule 02/05/2017   Thyroid nodule with FNA with atypia of unknown  significance and hyperthyroidism (Grave's Disease).  Was seen by Dr. Durene Fruits Endocrine.  Apparently deciding whether to undergo thyroidectomy vs medication  (methimazole or RAI treatment per patient.) She elected to take Methimazole.  Stopped with pregnancy in 10/2019 and thyroid testing okay   Visual acuity reduced 2008   Wears glasses only for nighttime driving   Past Surgical History:  Procedure Laterality Date   CESAREAN SECTION N/A 03/04/2015   Procedure: CESAREAN SECTION;  Surgeon: Kathreen Cosier, MD;  Location: [facility];  Service: Obstetrics;  Laterality: N/A;   DILATION AND EVACUATION N/A 10/18/2019   Procedure: DILATATION AND EVACUATION;  Surgeon: Catalina Antigua,  MD;  Location: [facility];  Service: Gynecology;  Laterality: N/A;   FNA thyroid nodule  06/22/2018   path equivocal--cells of unknown signficance.  Followed by Endocrine/[facility] and being treated with Methimazole for hyperthryroidism.   Family History  Problem Relation Age of Onset   Hepatitis B Father        cirrhosis with liver failure--complications of Chronic Hep B.   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   Originally from [place]   Came to [place] In [place]     Her first 2 children are with her first husband, who came with her to [place] And was killed in MVA here.   Middle child with a second man she married and divorced after 5 years.     Younger two children with her current husband   Her oldest daughter is married (10/2015) in [place]--grew up in maternal grandmother's home (has not been with this daughter since her daughter was 38 yo)   Lives with third husband and 3 of her children.     Son now in [place] and working at [employer]: Low Risk  (05/25/2023)   Overall Financial Resource Strain (CARDIA)    Difficulty of Paying Living Expenses: Not hard at all  Food Insecurity: No Food Insecurity (05/25/2023)   Hunger Vital Sign    Worried About  Running Out of Food in the Last Year: Never true    Ran Out of Food in the Last Year: Never true  Transportation Needs: No Transportation Needs (05/25/2023)   PRAPARE - Administrator, Civil Service (Medical): No    Lack of Transportation (Non-Medical): No  Physical Activity: Sufficiently Active (03/07/2019)   Exercise Vital Sign    Days of Exercise per Week: 7 days    Minutes of Exercise per Session: 30 min  Stress: Not on file  Social Connections: Unknown (03/07/2019)   Social Connection and Isolation Panel [NHANES]    Frequency of Communication with Friends  and Family: Not on file    Frequency of Social Gatherings with Friends and Family: Not on file    Attends Religious Services: Not on file    Active Member of Clubs or Organizations: Not on file    Attends Club or Organization Meetings: Not on file    Marital Status: Married  Intimate Partner Violence: Not At Risk (05/25/2023)   Humiliation, Afraid, Rape, and Kick questionnaire    Fear of Current or Ex-Partner: No    Emotionally Abused: No    Physically Abused: No    Sexually Abused: No      Review of Systems  HENT:  Negative for congestion, postnasal drip, rhinorrhea, sinus pressure and sneezing.   Eyes:  Positive for pain (like rocks in left eye this morning.) and redness. Negative for itching and visual disturbance.       No pets in house Wall to wall carpeting No dust issues. Dusts weekly.   Washes bedsheets every 2 weeks. Does not leave shoes at front door. Uses Flonase only intermittently when has nasal symptoms of allergies--over a week ago.   No use of non sedating antihistamine  Respiratory:  Negative for shortness of breath.   Cardiovascular:  Negative for chest pain, palpitations and leg swelling.  Gastrointestinal:  Negative for abdominal pain and blood in stool (No melena).  Neurological:  Negative for weakness and numbness.      Objective:   BP 104/68 (BP Location: Left Arm, Patient  Position: Sitting, Cuff Size: Normal)   Pulse 77   Resp 16   Ht 5' 3.5" (1.613 m)   Wt 180 lb (81.6 kg)   LMP 05/07/2023 (Exact Date)   BMI 31.39 kg/m   Physical Exam Constitutional:      Appearance: She is obese.  HENT:     Head: Normocephalic and atraumatic.     Right Ear: Tympanic membrane, ear canal and external ear normal.     Left Ear: Tympanic membrane, ear canal and external ear normal.     Nose: Congestion (nasal mucosa on right swollen with clear to white mucous blocking nasal passage.) present.     Mouth/Throat:     Mouth: Mucous membranes are moist.     Pharynx: Oropharynx is clear.  Eyes:     Extraocular Movements: Extraocular movements intact.     Pupils: Pupils are equal, round, and reactive to light.     Comments: Mild swelling of upper left lid and perhaps mild injection of left conjunctivae.  Clear watering from eye.    Neck:     Thyroid: Thyromegaly (nodular) present.  Cardiovascular:     Rate and Rhythm: Normal rate and regular rhythm.     Pulses:          Dorsalis pedis pulses are 2+ on the right side and 2+ on the left side.       Posterior tibial pulses are 2+ on the right side and 2+ on the left side.     Heart sounds: S1 normal and S2 normal. Murmur (Heard best at LSB sitting.) heard.     Systolic murmur is present with a grade of 2/6.     No S3 or S4 sounds.     Comments: No carotid bruits.  Carotid, radial, femoral, DP and PT pulses normal and equal.   Pulmonary:     Effort: Pulmonary effort is normal.     Breath sounds: Normal breath sounds and air entry.  Chest:  Breasts:    Right:  No inverted nipple, mass or nipple discharge.     Left: No inverted nipple, mass or nipple discharge.  Abdominal:     General: Bowel sounds are normal.     Palpations: Abdomen is soft. There is no hepatomegaly, splenomegaly or mass.     Tenderness: There is no abdominal tenderness.     Hernia: No hernia is present.  Genitourinary:    Comments: Normal external  female genitalia No vaginal lesions. What appears to be nabothian cyst at 1 O clock No uterine or adnexal mass or tenderness Musculoskeletal:        General: Normal range of motion.     Cervical back: Normal range of motion and neck supple.     Right lower leg: No edema.     Left lower leg: No edema.  Feet:     Right foot:     Protective Sensation: 10 sites tested.  10 sites sensed.     Skin integrity: Skin integrity normal.     Left foot:     Protective Sensation: 10 sites tested.  10 sites sensed.     Skin integrity: Skin integrity normal.     Comments: Thick discolored small toe nails. Lymphadenopathy:     Head:     Right side of head: No submental or submandibular adenopathy.     Left side of head: No submental or submandibular adenopathy.     Cervical: No cervical adenopathy.     Upper Body:     Right upper body: No supraclavicular or axillary adenopathy.     Left upper body: No supraclavicular or axillary adenopathy.     Lower Body: No right inguinal adenopathy. No left inguinal adenopathy.  Skin:    General: Skin is warm.     Capillary Refill: Capillary refill takes less than 2 seconds.     Findings: No rash.  Neurological:     General: No focal deficit present.     Mental Status: She is alert and oriented to person, place, and time.     Cranial Nerves: Cranial nerves 2-12 are intact.     Sensory: Sensation is intact.     Motor: Motor function is intact.     Coordination: Coordination is intact.     Gait: Gait is intact.     Deep Tendon Reflexes: Reflexes are normal and symmetric.  Psychiatric:        Speech: Speech normal.        Behavior: Behavior normal. Behavior is cooperative.      Assessment & Plan   CPE with pap Follow up of mammogram from August in December FIT to return in 2 weeks.  If returns abnormal, will send for repeat colonosopy. Influenza and COVID vaccine today.  2.  Hyperthyroidism and multinodular goiter:  Call Dr. Sharl Ma to get a better  idea of discussion had with patient in past regarding treatment.  Suspect until she and her husband are willing to use some form of pregnancy prevention and as long as she is relatively asymptomatic, he has discussed not using RAI or oral meds to control.   She is to start taking calcium along with Vitamin D3.  Discussed both needed.    3.  DM:  controlled  4.  Dyslipidemia with LDL too high.  Discussed again, no statin until some form of pregnancy prevention.    5.  Heart murmur:  no change.  Echo previously without anatomic abnormality.    6.  Eye complaints:  appears to be  part of allergies.  To use flonase regularly and add Zyrtec 10 mg daily.

## 2023-05-26 LAB — CYTOLOGY - PAP

## 2023-05-28 ENCOUNTER — Encounter: Payer: Medicaid Other | Admitting: Internal Medicine

## 2023-06-15 ENCOUNTER — Ambulatory Visit: Payer: Medicaid Other

## 2023-06-15 ENCOUNTER — Ambulatory Visit
Admission: RE | Admit: 2023-06-15 | Discharge: 2023-06-15 | Disposition: A | Discharge status: Home / Self Care | Payer: Medicaid Other | Source: Ambulatory Visit | Attending: Internal Medicine | Admitting: Internal Medicine

## 2023-06-15 DIAGNOSIS — R928 Other abnormal and inconclusive findings on diagnostic imaging of breast: Secondary | ICD-10-CM

## 2023-09-23 ENCOUNTER — Other Ambulatory Visit: Payer: Self-pay

## 2023-09-23 MED ORDER — IBUPROFEN 800 MG PO TABS: ORAL_TABLET | ORAL | 0 refills | Status: DC

## 2023-10-27 ENCOUNTER — Other Ambulatory Visit: Payer: Self-pay

## 2023-10-27 ENCOUNTER — Other Ambulatory Visit: Payer: Self-pay | Admitting: Internal Medicine

## 2023-10-27 MED ORDER — IBUPROFEN 800 MG PO TABS: ORAL_TABLET | ORAL | 0 refills | Status: DC

## 2023-11-18 ENCOUNTER — Other Ambulatory Visit: Payer: Medicaid Other

## 2023-11-18 DIAGNOSIS — E782 Mixed hyperlipidemia: Secondary | ICD-10-CM

## 2023-11-18 DIAGNOSIS — E119 Type 2 diabetes mellitus without complications: Secondary | ICD-10-CM

## 2023-11-19 LAB — HEMOGLOBIN A1C
Est. average glucose Bld gHb Est-mCnc: 134 mg/dL
Hgb A1c MFr Bld: 6.3 % — ABNORMAL HIGH (ref 4.8–5.6)

## 2023-11-19 LAB — LIPID PANEL W/O CHOL/HDL RATIO
Cholesterol, Total: 164 mg/dL (ref 100–199)
HDL: 52 mg/dL (ref >39)
LDL Chol Calc (NIH): 98 mg/dL (ref 0–99)
Triglycerides: 74 mg/dL (ref 0–149)
VLDL Cholesterol Cal: 14 mg/dL (ref 5–40)

## 2023-11-22 ENCOUNTER — Ambulatory Visit: Payer: Medicaid Other | Admitting: Internal Medicine

## 2023-11-22 ENCOUNTER — Encounter: Payer: Self-pay | Admitting: Internal Medicine

## 2023-11-22 VITALS — BP 120/74 | HR 72 | Resp 16 | Ht 63.5 in | Wt 193.0 lb

## 2023-11-22 DIAGNOSIS — E119 Type 2 diabetes mellitus without complications: Secondary | ICD-10-CM

## 2023-11-22 DIAGNOSIS — E785 Hyperlipidemia, unspecified: Secondary | ICD-10-CM | POA: Diagnosis not present

## 2023-11-22 MED ORDER — IBUPROFEN 800 MG PO TABS: ORAL_TABLET | ORAL | 0 refills | Status: DC

## 2023-11-22 NOTE — Progress Notes (Signed)
 Subjective:    Patient ID: [name], female   DOB: 10-09-1980, 43 y.o.   MRN: [id number]   HPI   DM:  A1C a bit higher at 6.3%, but still at goal.  Has not had an eye check this year.  Goes to Dr. Candi Chafe.    2.  Elevated LDL:  She is still having regular period without hot flashes or night sweats, so unable to use a statin as she has a surprise pregnancy a couple of years ago.  Aaron Aas  Her LDL is lower, but still a bit high at 98.   Lipid Panel     Component Value Date/Time   CHOL 164 11/18/2023 0842   TRIG 74 11/18/2023 0842   HDL 52 11/18/2023 0842   LDLCALC 98 11/18/2023 0842   LABVLDL 14 11/18/2023 0842   3.  Every month with her period, has cramps, HA, dizziness.  Ibuprofen  works well for her.  She would like the 800 mg tabs.    Current Meds  Medication Sig   Blood Glucose Monitoring Suppl DEVI May substitute to any manufacturer covered by patient's insurance check blood glucose twice daily before meals   cetirizine  (ZYRTEC  ALLERGY) 10 MG tablet Take 1 tablet (10 mg total) by mouth daily.   Cholecalciferol (VITAMIN D3 PO) Take one pill daily, 1000 IU   fluticasone  (FLONASE ) 50 MCG/ACT nasal spray Place 2 sprays into both nostrils daily.   gabapentin  (NEURONTIN ) 300 MG capsule Take 1 capsule by mouth twice daily   Glucose Blood (BLOOD GLUCOSE TEST STRIPS) STRP May substitute to any manufacturer covered by patient's insurance Check blood glucose twice daily before meals   ibuprofen  (ADVIL ) 800 MG tablet 1 tab by mouth with meal every 8 hours as needed for pain/headache   Lancet Device MISC May substitute to any manufacturer covered by patient's insurance  check blood glucose twice daily before meals.   Lancets Misc. MISC May substitute to any manufacturer covered by patient's insurance check blood glucose twice daily before meals   metFORMIN  (GLUCOPHAGE ) 500 MG tablet TAKE 1 TABLET BY MOUTH TWICE DAILY WITH A MEAL   No Known Allergies   Review of Systems    Objective:    BP 120/74   Pulse 72   Resp 16   Ht 5' 3.5" (1.613 m)   Wt 193 lb (87.5 kg)   SpO2 99%   BMI 33.65 kg/m   Physical Exam HENT:     Head: Normocephalic and atraumatic.     Right Ear: Tympanic membrane normal.     Left Ear: Tympanic membrane normal.     Nose: Nose normal.     Mouth/Throat:     Mouth: Mucous membranes are moist.     Pharynx: Oropharynx is clear.  Eyes:     Extraocular Movements: Extraocular movements intact.     Conjunctiva/sclera: Conjunctivae normal.     Pupils: Pupils are equal, round, and reactive to light.  Neck:     Thyroid : Thyromegaly (nodular) present.  Cardiovascular:     Pulses:          Dorsalis pedis pulses are 2+ on the right side and 2+ on the left side.     Heart sounds: S1 normal and S2 normal. Murmur heard.     Systolic murmur is present with a grade of 2/6.     No friction rub. No S3 or S4 sounds.     Comments: No carotid bruits.  Carotid, radial, femoral, DP pulses normal  and equal.   Pulmonary:     Breath sounds: Normal breath sounds and air entry.  Musculoskeletal:     Cervical back: Normal range of motion and neck supple.     Right lower leg: No edema.     Left lower leg: No edema.  Feet:     Right foot:     Protective Sensation: 10 sites tested.  10 sites sensed.     Skin integrity: Skin integrity normal.     Toenail Condition: Right toenails are normal.     Left foot:     Protective Sensation: 10 sites tested.  10 sites sensed.     Skin integrity: Skin integrity normal.     Toenail Condition: Left toenails are normal.  Neurological:     Mental Status: She is alert.      Assessment & Plan   DM:  discussed the possibility of switching to Ozempic and Jardiance in the future when she is menopausal.  As she became pregnant in 2021, is not using BC and not enough info on safety of these meds in pregnancy, will hold for now.  Her control is fine, but would like to see her lose weight. Referral for eye exam.  2.  Elevated LDL:   holding on statin without birth control.  LDL slightly high for DM.    3.  Dysmenorrhea/HA with period:  Ibuprofen .refilled.

## 2023-12-25 ENCOUNTER — Other Ambulatory Visit: Payer: Self-pay | Admitting: Internal Medicine

## 2024-03-21 ENCOUNTER — Other Ambulatory Visit: Payer: Self-pay | Admitting: Internal Medicine

## 2024-03-21 DIAGNOSIS — E119 Type 2 diabetes mellitus without complications: Secondary | ICD-10-CM

## 2024-04-28 ENCOUNTER — Telehealth: Payer: Self-pay | Admitting: Internal Medicine

## 2024-04-28 NOTE — Telephone Encounter (Signed)
 Patient states she would like to be seen as she is concern she might have a urine infection.  Patient states she is having itchiness on her vagina and also pain when urinating.

## 2024-04-28 NOTE — Telephone Encounter (Signed)
Called patient to offer appointment, patient did not answer.

## 2024-04-30 ENCOUNTER — Encounter (HOSPITAL_COMMUNITY): Payer: Self-pay

## 2024-04-30 ENCOUNTER — Ambulatory Visit (HOSPITAL_COMMUNITY)
Admission: EM | Admit: 2024-04-30 | Discharge: 2024-04-30 | Disposition: A | Discharge status: Home / Self Care | Attending: Nurse Practitioner | Admitting: Nurse Practitioner

## 2024-04-30 DIAGNOSIS — Z113 Encounter for screening for infections with a predominantly sexual mode of transmission: Secondary | ICD-10-CM | POA: Diagnosis present

## 2024-04-30 DIAGNOSIS — E119 Type 2 diabetes mellitus without complications: Secondary | ICD-10-CM | POA: Insufficient documentation

## 2024-04-30 DIAGNOSIS — Z7984 Long term (current) use of oral hypoglycemic drugs: Secondary | ICD-10-CM | POA: Insufficient documentation

## 2024-04-30 DIAGNOSIS — N898 Other specified noninflammatory disorders of vagina: Secondary | ICD-10-CM | POA: Diagnosis not present

## 2024-04-30 DIAGNOSIS — Z79899 Other long term (current) drug therapy: Secondary | ICD-10-CM | POA: Diagnosis not present

## 2024-04-30 DIAGNOSIS — L292 Pruritus vulvae: Secondary | ICD-10-CM | POA: Diagnosis not present

## 2024-04-30 DIAGNOSIS — Z3202 Encounter for pregnancy test, result negative: Secondary | ICD-10-CM | POA: Insufficient documentation

## 2024-04-30 LAB — POCT URINALYSIS DIP (MANUAL ENTRY)
Bilirubin, UA: NEGATIVE
Glucose, UA: >=1000 mg/dL — AB
Nitrite, UA: NEGATIVE
Protein Ur, POC: NEGATIVE mg/dL
Spec Grav, UA: 1.02 (ref 1.010–1.025)
Urobilinogen, UA: 0.2 U/dL
pH, UA: 5.5 (ref 5.0–8.0)

## 2024-04-30 LAB — POCT URINE PREGNANCY: Preg Test, Ur: NEGATIVE

## 2024-04-30 MED ORDER — FLUCONAZOLE 150 MG PO TABS: 150.0000 mg | ORAL_TABLET | Freq: Once | ORAL | 0 refills | Status: AC

## 2024-04-30 NOTE — Discharge Instructions (Addendum)
 As we discussed, the urine test today does not show a clear UTI and your symptoms are consistent with a yeast infection.  Take 1 pill of the Diflucan  today to treat a yeast infection and repeat in 3 days if symptoms continue.  We will call you later this week if the urine test or vaginal test comes back positive for anything else that requires treatment.

## 2024-04-30 NOTE — ED Triage Notes (Signed)
 Vaginal irritation and itching x 3 days. Denies any other symptoms.

## 2024-04-30 NOTE — ED Provider Notes (Signed)
 [facility]    CSN: [id number] Arrival date & time: 04/30/24  1002      History   Chief Complaint Chief Complaint  Patient presents with   Vaginitis    HPI [name] is a 43 y.o. female.   Patient presents today for 3-day history of vaginal irritation, burning, and itching.  Reports she noticed that after a shower.  No pain with urination, increased urinary frequency, urgency, or hematuria.  No abdominal, pelvic pain, nausea/vomiting, or diarrhea.  No change in appetite or change in behavior.  No vaginal discharge and no known exposure to STI.  Patient reports history of diabetes.  Follows with [facility].  Takes metformin  twice daily.    Past Medical History:  Diagnosis Date   Biological false positive RPR test 08/23/2016   FTA-ABS negative   Diabetes mellitus without complication (HCC) 2011   Dyslipidemia 04/27/2017   Gestational diabetes    Hyperthyroidism    Positive PPD 2016   + PPD :  history of BCG as well.  Was unable to treat for latent when pregnant.  Sent for CXR 12.2017, which was negative for active TB.  To go to [facility] for treatment of latent.  07/2016 documented   Thyroid  nodule 02/05/2017   Thyroid  nodule with FNA with atypia of unknown significance and hyperthyroidism (Grave's Disease).  Was seen by Dr. Faythe Ee [facility].  Apparently deciding whether to undergo thyroidectomy vs medication  (methimazole or RAI treatment per patient.) She elected to take Methimazole.  Stopped with pregnancy in 10/2019 and thyroid  testing okay   Visual acuity reduced 2008   Wears glasses only for nighttime driving    Patient Active Problem List   Diagnosis Date Noted   Dyslipidemia 04/27/2017   Thyroid  nodule 02/05/2017   Lesion of cervix 02/05/2017   Biological false positive RPR test 08/23/2016   Hyperthyroidism 04/06/2016   Heart murmur, systolic 04/06/2016   Hyperlipidemia 02/03/2016   Controlled type 2 diabetes mellitus without  complication, without long-term current use of insulin (HCC) 06/26/2015   Arm pain 08/06/2014   Positive PPD 07/13/2014   Complex regional pain syndrome 05/16/2014   Radial nerve disease 03/12/2014    Past Surgical History:  Procedure Laterality Date   CESAREAN SECTION N/A 03/04/2015   Procedure: CESAREAN SECTION;  Surgeon: Aida DELENA Na, MD;  Location: [facility];  Service: Obstetrics;  Laterality: N/A;   DILATION AND EVACUATION N/A 10/18/2019   Procedure: DILATATION AND EVACUATION;  Surgeon: Alger Gong, MD;  Location: [facility];  Service: Gynecology;  Laterality: N/A;   FNA thyroid  nodule  06/22/2018   path equivocal--cells of unknown signficance.  Followed by [facility] and being treated with Methimazole for hyperthryroidism.    OB History     Gravida  7   Para  5   Term  5   Preterm      AB  1   Living  1      SAB  1   IAB      Ectopic      Multiple  0   Live Births  1            Home Medications    Prior to Admission medications   Medication Sig Start Date End Date Taking? Authorizing Provider  Blood Glucose Monitoring Suppl DEVI May substitute to any manufacturer covered by patient's insurance check blood glucose twice daily before meals 05/25/23  Yes Adella Norris, MD  Calcium  250 MG CAPS  500 mg twice daily 05/25/23  Yes Adella Norris, MD  cetirizine  (ZYRTEC  ALLERGY) 10 MG tablet Take 1 tablet (10 mg total) by mouth daily. 05/25/23  Yes Adella Norris, MD  Cholecalciferol (VITAMIN D3 PO) Take one pill daily, 1000 IU   Yes [provider]  fluconazole  (DIFLUCAN ) 150 MG tablet Take 1 tablet (150 mg total) by mouth once for 1 dose. Repeat in 72 hours if symptoms persist 04/30/24 04/30/24 Yes Chandra Raisin A, NP  fluticasone  (FLONASE ) 50 MCG/ACT nasal spray Place 2 sprays into both nostrils daily. 12/21/22  Yes Rising, Asberry, PA-C  gabapentin  (NEURONTIN ) 300 MG capsule Take 1 capsule by mouth twice  daily 10/27/23  Yes Adella Norris, MD  Glucose Blood (BLOOD GLUCOSE TEST STRIPS) STRP May substitute to any manufacturer covered by patient's insurance Check blood glucose twice daily before meals 05/25/23  Yes Adella Norris, MD  ibuprofen  (ADVIL ) 800 MG tablet TAKE 1 TABLET BY MOUTH EVERY 8 HOURS AS NEEDED FOR PAIN/HEADACHE WITH MEALS 12/27/23  Yes Adella Norris, MD  Lancet Device MISC May substitute to any manufacturer covered by patient's insurance  check blood glucose twice daily before meals. 05/25/23  Yes Adella Norris, MD  Lancets Misc. MISC May substitute to any manufacturer covered by patient's insurance check blood glucose twice daily before meals 05/25/23  Yes Adella Norris, MD  metFORMIN  (GLUCOPHAGE ) 500 MG tablet TAKE 1 TABLET BY MOUTH TWICE DAILY WITH A MEAL 03/27/24  Yes Adella Norris, MD  sodium chloride  (OCEAN) 0.65 % SOLN nasal spray Place 1 spray into both nostrils as needed for congestion. 11/20/22  Yes Garrison, [place]  N, FNP    Family History Family History  Problem Relation Age of Onset   Hepatitis B Father        cirrhosis with liver failure--complications of Chronic Hep B.   Stroke Paternal Grandmother     Social History Social History   Tobacco Use   Smoking status: Never    Passive exposure: Never   Smokeless tobacco: Never  Vaping Use   Vaping status: Never Used  Substance Use Topics   Alcohol use: No    Alcohol/week: 0.0 standard drinks of alcohol   Drug use: No     Allergies   Patient has no known allergies.   Review of Systems Review of Systems Per HPI  Physical Exam Triage Vital Signs ED Triage Vitals  Encounter Vitals Group     BP 04/30/24 1026 121/68     Girls Systolic BP Percentile --      Girls Diastolic BP Percentile --      Boys Systolic BP Percentile --      Boys Diastolic BP Percentile --      Pulse Rate 04/30/24 1026 83     Resp 04/30/24 1026 18     Temp 04/30/24 1026 99.1 F (37.3 C)      Temp Source 04/30/24 1026 Oral     SpO2 04/30/24 1026 98 %     Weight --      Height --      Head Circumference --      Peak Flow --      Pain Score 04/30/24 1028 0     Pain Loc --      Pain Education --      Exclude from Growth Chart --    No data found.  Updated Vital Signs BP 121/68 (BP Location: Left Arm)   Pulse 83   Temp 99.1 F (37.3 C) (Oral)  Resp 18   LMP 04/03/2024 (Approximate)   SpO2 98%   Visual Acuity Right Eye Distance:   Left Eye Distance:   Bilateral Distance:    Right Eye Near:   Left Eye Near:    Bilateral Near:     Physical Exam Vitals and nursing note reviewed.  Constitutional:      General: She is not in acute distress.    Appearance: Normal appearance. She is not toxic-appearing.  Pulmonary:     Effort: Pulmonary effort is normal. No respiratory distress.  Skin:    General: Skin is warm and dry.     Coloration: Skin is not jaundiced or pale.     Findings: No erythema.  Neurological:     Mental Status: She is alert and oriented to person, place, and time.     Motor: No weakness.     Gait: Gait normal.  Psychiatric:        Mood and Affect: Mood normal.        Behavior: Behavior is cooperative.      UC Treatments / Results  Labs (all labs ordered are listed, but only abnormal results are displayed) Labs Reviewed  POCT URINALYSIS DIP (MANUAL ENTRY) - Abnormal; Notable for the following components:      Result Value   Clarity, UA cloudy (*)    Glucose, UA >=1,000 (*)    Ketones, POC UA trace (5) (*)    Blood, UA trace-intact (*)    Leukocytes, UA Trace (*)    All other components within normal limits  URINE CULTURE  POCT URINE PREGNANCY  CERVICOVAGINAL ANCILLARY ONLY    EKG   Radiology No results found.  Procedures Procedures (including critical care time)  Medications Ordered in UC Medications - No data to display  Initial Impression / Assessment and Plan / UC Course  I have reviewed the triage vital signs and the  nursing notes.  Pertinent labs & imaging results that were available during my care of the patient were reviewed by me and considered in my medical decision making (see chart for details).   Patient is well-appearing, normotensive, afebrile, not tachycardic, not tachypneic, oxygenating well on room air.   1. Vaginal itching 2. Urine pregnancy test negative Vaginal cytology is pending-treat as indicated if anything other than yeast infection Given history of type 2 diabetes, highly suspicious for yeast infection, treat with Diflucan  once and repeat in 72 hours if still symptomatic Urine culture pending given trace leukocyte esterase, treat as indicated if positive  The patient was given the opportunity to ask questions.  All questions answered to their satisfaction.  The patient is in agreement to this plan.   Final Clinical Impressions(s) / UC Diagnoses   Final diagnoses:  Vaginal itching  Urine pregnancy test negative     Discharge Instructions      As we discussed, the urine test today does not show a clear UTI and your symptoms are consistent with a yeast infection.  Take 1 pill of the Diflucan  today to treat a yeast infection and repeat in 3 days if symptoms continue.  We will call you later this week if the urine test or vaginal test comes back positive for anything else that requires treatment.    ED Prescriptions     Medication Sig Dispense Auth. Provider   fluconazole  (DIFLUCAN ) 150 MG tablet Take 1 tablet (150 mg total) by mouth once for 1 dose. Repeat in 72 hours if symptoms persist 2 tablet Chandra Raisin  A, NP      PDMP not reviewed this encounter.   Chandra Harlene LABOR, NP 04/30/24 [phone]

## 2024-05-02 LAB — URINE CULTURE

## 2024-05-02 LAB — CERVICOVAGINAL ANCILLARY ONLY
Comment: NEGATIVE
Comment: NEGATIVE
Comment: NEGATIVE
Comment: NEGATIVE
Comment: NORMAL

## 2024-05-02 NOTE — Telephone Encounter (Signed)
 Called patient to offer appointment, patient states she no longer needs appointment as she went to urgent care. Patient would like to wait for her upcoming CPE appointment in December.

## 2024-05-03 ENCOUNTER — Ambulatory Visit (HOSPITAL_COMMUNITY): Payer: Self-pay

## 2024-05-14 ENCOUNTER — Other Ambulatory Visit: Payer: Self-pay | Admitting: Internal Medicine

## 2024-05-16 ENCOUNTER — Telehealth: Payer: Self-pay | Admitting: Internal Medicine

## 2024-05-16 NOTE — Telephone Encounter (Signed)
 Patient called and requested medication refills for   cetirizine  (ZYRTEC  ALLERGY) 10 MG tablet [[id number]]    Please send prescription to   [facility] - [place] ([place] - [address], [place] ([place]) [place] [address] Phone: [phone]  Fax: [phone]

## 2024-05-21 ENCOUNTER — Other Ambulatory Visit: Payer: Self-pay | Admitting: Internal Medicine

## 2024-05-22 ENCOUNTER — Ambulatory Visit (HOSPITAL_COMMUNITY)
Admission: EM | Admit: 2024-05-22 | Discharge: 2024-05-22 | Disposition: A | Discharge status: Home / Self Care | Attending: Physician Assistant | Admitting: Physician Assistant

## 2024-05-22 ENCOUNTER — Encounter (HOSPITAL_COMMUNITY): Payer: Self-pay | Admitting: *Deleted

## 2024-05-22 DIAGNOSIS — K529 Noninfective gastroenteritis and colitis, unspecified: Secondary | ICD-10-CM | POA: Insufficient documentation

## 2024-05-22 DIAGNOSIS — R300 Dysuria: Secondary | ICD-10-CM | POA: Insufficient documentation

## 2024-05-22 DIAGNOSIS — R197 Diarrhea, unspecified: Secondary | ICD-10-CM | POA: Insufficient documentation

## 2024-05-22 DIAGNOSIS — R110 Nausea: Secondary | ICD-10-CM | POA: Insufficient documentation

## 2024-05-22 DIAGNOSIS — N898 Other specified noninflammatory disorders of vagina: Secondary | ICD-10-CM | POA: Insufficient documentation

## 2024-05-22 LAB — POCT URINE DIPSTICK
Bilirubin, UA: NEGATIVE
Blood, UA: NEGATIVE
Glucose, UA: NEGATIVE mg/dL
Ketones, POC UA: NEGATIVE mg/dL
Nitrite, UA: NEGATIVE
POC PROTEIN,UA: NEGATIVE
Spec Grav, UA: 1.01 (ref 1.010–1.025)
Urobilinogen, UA: 0.2 U/dL
pH, UA: 5.5 (ref 5.0–8.0)

## 2024-05-22 MED ORDER — ONDANSETRON 4 MG PO TBDP
4.0000 mg | ORAL_TABLET | Freq: Once | ORAL | Status: AC
Start: 2024-05-22 — End: 2024-05-22
  Administered 2024-05-22: 4 mg via ORAL

## 2024-05-22 MED ORDER — NYSTATIN 100000 UNIT/GM EX CREA: TOPICAL_CREAM | CUTANEOUS | 0 refills | Status: DC

## 2024-05-22 MED ORDER — ONDANSETRON 4 MG PO TBDP
ORAL_TABLET | ORAL | Status: AC
  Filled 2024-05-22: qty 1

## 2024-05-22 MED ORDER — ONDANSETRON 4 MG PO TBDP: 4.0000 mg | ORAL_TABLET | Freq: Three times a day (TID) | ORAL | 0 refills | Status: DC | PRN

## 2024-05-22 MED ORDER — FLUCONAZOLE 150 MG PO TABS: 150.0000 mg | ORAL_TABLET | ORAL | 0 refills | Status: DC | PRN

## 2024-05-22 NOTE — ED Triage Notes (Signed)
 Pt states she has had diarrhea, nausea X 3 days and now she is feeling weak and dizzy. She has not taken any meds.   She also has some vaginal itching which she states is like her last visit.

## 2024-05-22 NOTE — ED Provider Notes (Signed)
 [facility]    CSN: [id number] Arrival date & time: 05/22/24  0803      History   Chief Complaint Chief Complaint  Patient presents with   Nausea   Vaginal Itching   Diarrhea   Weakness   Dizziness    HPI [name] is a 43 y.o. female.  has a past medical history of Biological false positive RPR test (08/23/2016), Diabetes mellitus without complication (HCC) (2011), Dyslipidemia (04/27/2017), Gestational diabetes, Hyperthyroidism, Positive PPD (2016), Thyroid  nodule (02/05/2017), and Visual acuity reduced (2008).   HPI  Discussed the use of AI scribe software for clinical note transcription with the patient, who gave verbal consent to proceed.  The patient presents with abdominal pain, diarrhea, and weakness.  Abdominal pain has been present for the past three to four days, described as crampy and associated with a gassy sensation, particularly noticeable during bathroom use. Diarrhea occurs every five to ten minutes without blood in the stool or pain during bowel movements.  She experiences a lack of appetite and weakness. She has been able to drink water but has not been eating due to the lack of appetite. No medications have been taken for these symptoms at home.  Additionally, she has headaches and itching in the genital area, along with dysuria. No fever or chills have been noted. She feels nauseated but has not vomited.  Her past medical history includes a C-section with her last baby, but she denies any other abdominal surgeries.    Past Medical History:  Diagnosis Date   Biological false positive RPR test 08/23/2016   FTA-ABS negative   Diabetes mellitus without complication (HCC) 2011   Dyslipidemia 04/27/2017   Gestational diabetes    Hyperthyroidism    Positive PPD 2016   + PPD :  history of BCG as well.  Was unable to treat for latent when pregnant.  Sent for CXR 12.2017, which was negative for active TB.  To go to [facility] for treatment of  latent.  07/2016 documented   Thyroid  nodule 02/05/2017   Thyroid  nodule with FNA with atypia of unknown significance and hyperthyroidism (Grave's Disease).  Was seen by Dr. Faythe Ee [facility].  Apparently deciding whether to undergo thyroidectomy vs medication  (methimazole or RAI treatment per patient.) She elected to take Methimazole.  Stopped with pregnancy in 10/2019 and thyroid  testing okay   Visual acuity reduced 2008   Wears glasses only for nighttime driving    Patient Active Problem List   Diagnosis Date Noted   Dyslipidemia 04/27/2017   Thyroid  nodule 02/05/2017   Lesion of cervix 02/05/2017   Biological false positive RPR test 08/23/2016   Hyperthyroidism 04/06/2016   Heart murmur, systolic 04/06/2016   Hyperlipidemia 02/03/2016   Controlled type 2 diabetes mellitus without complication, without long-term current use of insulin (HCC) 06/26/2015   Arm pain 08/06/2014   Positive PPD 07/13/2014   Complex regional pain syndrome 05/16/2014   Radial nerve disease 03/12/2014    Past Surgical History:  Procedure Laterality Date   CESAREAN SECTION N/A 03/04/2015   Procedure: CESAREAN SECTION;  Surgeon: Aida DELENA Na, MD;  Location: [facility];  Service: Obstetrics;  Laterality: N/A;   DILATION AND EVACUATION N/A 10/18/2019   Procedure: DILATATION AND EVACUATION;  Surgeon: Alger Gong, MD;  Location: [facility];  Service: Gynecology;  Laterality: N/A;   FNA thyroid  nodule  06/22/2018   path equivocal--cells of unknown signficance.  Followed by Endocrine[facility] and being treated with Methimazole for hyperthryroidism.  OB History     Gravida  7   Para  5   Term  5   Preterm      AB  1   Living  1      SAB  1   IAB      Ectopic      Multiple  0   Live Births  1            Home Medications    Prior to Admission medications   Medication Sig Start Date End Date Taking? Authorizing Provider  Blood Glucose Monitoring Suppl DEVI May  substitute to any manufacturer covered by patient's insurance check blood glucose twice daily before meals 05/25/23  Yes Adella Norris, MD  Calcium  250 MG CAPS 500 mg twice daily 05/25/23  Yes Adella Norris, MD  cetirizine  (ZYRTEC ) 10 MG tablet Take 1 tablet by mouth once daily 05/17/24  Yes Adella Norris, MD  Cholecalciferol (VITAMIN D3 PO) Take one pill daily, 1000 IU   Yes [provider]  fluconazole  (DIFLUCAN ) 150 MG tablet Take 1 tablet (150 mg total) by mouth every three (3) days as needed. May repeat in 3 days if symptoms not resolved 05/22/24  Yes Fabien Travelstead E, PA-C  fluticasone  (FLONASE ) 50 MCG/ACT nasal spray Place 2 sprays into both nostrils daily. 12/21/22  Yes Rising, Asberry, PA-C  gabapentin  (NEURONTIN ) 300 MG capsule Take 1 capsule by mouth twice daily 10/27/23  Yes Adella Norris, MD  Glucose Blood (BLOOD GLUCOSE TEST STRIPS) STRP May substitute to any manufacturer covered by patient's insurance Check blood glucose twice daily before meals 05/25/23  Yes Adella Norris, MD  ibuprofen  (ADVIL ) 800 MG tablet TAKE 1 TABLET BY MOUTH EVERY 8 HOURS AS NEEDED FOR PAIN/HEADACHE WITH MEALS 12/27/23  Yes Adella Norris, MD  Lancet Device MISC May substitute to any manufacturer covered by patient's insurance  check blood glucose twice daily before meals. 05/25/23  Yes Adella Norris, MD  Lancets Misc. MISC May substitute to any manufacturer covered by patient's insurance check blood glucose twice daily before meals 05/25/23  Yes Adella Norris, MD  metFORMIN  (GLUCOPHAGE ) 500 MG tablet TAKE 1 TABLET BY MOUTH TWICE DAILY WITH A MEAL 03/27/24  Yes Adella Norris, MD  nystatin cream (MYCOSTATIN) Apply to affected area 2 times daily 05/22/24  Yes Labib Cwynar E, PA-C  ondansetron  (ZOFRAN -ODT) 4 MG disintegrating tablet Take 1 tablet (4 mg total) by mouth every 8 (eight) hours as needed for nausea or vomiting. 05/22/24  Yes Deniel Mcquiston E, PA-C  sodium  chloride (OCEAN) 0.65 % SOLN nasal spray Place 1 spray into both nostrils as needed for congestion. 11/20/22  Yes Dreama, [place]  N, FNP    Family History Family History  Problem Relation Age of Onset   Hepatitis B Father        cirrhosis with liver failure--complications of Chronic Hep B.   Stroke Paternal Grandmother     Social History Social History   Tobacco Use   Smoking status: Never    Passive exposure: Never   Smokeless tobacco: Never  Vaping Use   Vaping status: Never Used  Substance Use Topics   Alcohol use: No    Alcohol/week: 0.0 standard drinks of alcohol   Drug use: No     Allergies   Patient has no known allergies.   Review of Systems Review of Systems  Constitutional:  Positive for appetite change and fatigue. Negative for chills and fever.  Gastrointestinal:  Positive for abdominal pain, diarrhea  and nausea. Negative for blood in stool, rectal pain and vomiting.  Genitourinary:  Positive for dysuria.       Vaginal itching and burning   Neurological:  Positive for headaches.     Physical Exam Triage Vital Signs ED Triage Vitals  Encounter Vitals Group     BP 05/22/24 0836 132/82     Girls Systolic BP Percentile --      Girls Diastolic BP Percentile --      Boys Systolic BP Percentile --      Boys Diastolic BP Percentile --      Pulse Rate 05/22/24 0836 75     Resp 05/22/24 0836 16     Temp 05/22/24 0836 98.4 F (36.9 C)     Temp Source 05/22/24 0836 Oral     SpO2 05/22/24 0836 98 %     Weight --      Height --      Head Circumference --      Peak Flow --      Pain Score 05/22/24 0835 0     Pain Loc --      Pain Education --      Exclude from Growth Chart --    No data found.  Updated Vital Signs BP 132/82 (BP Location: Left Arm)   Pulse 75   Temp 98.4 F (36.9 C) (Oral)   Resp 16   LMP 05/02/2024 (Approximate)   SpO2 98%   Visual Acuity Right Eye Distance:   Left Eye Distance:   Bilateral Distance:    Right Eye Near:    Left Eye Near:    Bilateral Near:     Physical Exam Vitals reviewed.  Constitutional:      General: She is awake. She is not in acute distress.    Appearance: Normal appearance. She is well-developed and well-groomed. She is not ill-appearing, toxic-appearing or diaphoretic.  HENT:     Head: Normocephalic and atraumatic.  Eyes:     General: Lids are normal. Gaze aligned appropriately.     Extraocular Movements: Extraocular movements intact.     Conjunctiva/sclera: Conjunctivae normal.  Cardiovascular:     Rate and Rhythm: Normal rate and regular rhythm.     Heart sounds: Normal heart sounds. No murmur heard.    No friction rub. No gallop.  Pulmonary:     Effort: Pulmonary effort is normal.     Breath sounds: Normal breath sounds. No decreased air movement. No decreased breath sounds, wheezing, rhonchi or rales.  Abdominal:     General: Abdomen is flat. Bowel sounds are decreased.     Palpations: Abdomen is soft.     Tenderness: There is generalized abdominal tenderness. There is no guarding or rebound. Negative signs include Murphy's sign, Rovsing's sign and McBurney's sign.  Musculoskeletal:     Cervical back: Normal range of motion and neck supple.  Skin:    General: Skin is warm and dry.  Neurological:     Mental Status: She is alert and oriented to person, place, and time.  Psychiatric:        Attention and Perception: Attention and perception normal.        Mood and Affect: Mood and affect normal.        Speech: Speech normal.        Behavior: Behavior normal. Behavior is cooperative.        Thought Content: Thought content normal.        Judgment: Judgment normal.  UC Treatments / Results  Labs (all labs ordered are listed, but only abnormal results are displayed) Labs Reviewed  POCT URINE DIPSTICK - Abnormal; Notable for the following components:      Result Value   Leukocytes, UA Small (1+) (*)    All other components within normal limits  URINE CULTURE   CERVICOVAGINAL ANCILLARY ONLY    EKG   Radiology No results found.  Procedures Procedures (including critical care time)  Medications Ordered in UC Medications  ondansetron  (ZOFRAN -ODT) disintegrating tablet 4 mg (4 mg Oral Given 05/22/24 0915)    Initial Impression / Assessment and Plan / UC Course  I have reviewed the triage vital signs and the nursing notes.  Pertinent labs & imaging results that were available during my care of the patient were reviewed by me and considered in my medical decision making (see chart for details).      Final Clinical Impressions(s) / UC Diagnoses   Final diagnoses:  Vaginal itching  Nausea without vomiting  Diarrhea of presumed infectious origin  Dysuria  Enteritis   Acute gastroenteritis with diarrhea, abdominal pain, and vomiting Acute gastroenteritis likely due to a viral infection, presenting with diarrhea, abdominal pain, and vomiting for the past three to four days. Symptoms include frequent diarrhea, abdominal cramping, and nausea. No fever or chills reported. Dehydration is a concern due to fluid loss from diarrhea and vomiting. - Administered Zofran  for nausea, with potential side effect of constipation. Will send script for this for home use as well.  - Advised hydration with water and electrolyte replacement drinks like Pedialyte or Gatorade. - Recommended bland diet including applesauce, bananas, toast, boiled chicken, and rice. - Instructed to monitor for signs of severe dehydration or complications such as bloody diarrhea, intense abdominal swelling, or inability to keep fluids down. - Provided work note for return on Wednesday.  Dysuria and vulvar pruritus Dysuria and vulvar pruritus with itching in the genital area and pain during urination. - Obtained urine sample for analysis.  Urine dip is notable for leukocytes.  I suspect this is likely contamination from vulvovaginal process but will send urine culture all for  definitive rule out. - obtained cervicovaginal swab for further evaluation Patient symptoms appear consistent with vulvovaginal yeast infection.  Will start patient on Diflucan  as well as nystatin cream to assist with symptoms while awaiting test results.  Reviewed with patient that she will be contacted if we need to make any adjustments to her management plan based on test results.  Headache Reported alongside other symptoms of gastroenteritis. No specific details on severity or duration provided. - Manage symptoms with hydration and rest.     Discharge Instructions      VISIT SUMMARY:  You came in today with abdominal pain, diarrhea, and weakness that have been ongoing for the past three to four days. You also reported headaches, itching in the genital area, and pain during urination. You have not had a fever or chills, but you feel nauseated and have not been eating well.  YOUR PLAN:  -ACUTE GASTROENTERITIS: Acute gastroenteritis is an infection of the stomach and intestines that causes symptoms like diarrhea, abdominal pain, and vomiting. You were given Zofran  to help with nausea, but it may cause constipation. You should stay hydrated by drinking water and electrolyte drinks like Pedialyte or Gatorade. Eat a bland diet including foods like applesauce, bananas, toast, boiled chicken, and rice. Watch for signs of severe dehydration or complications, such as bloody diarrhea, intense abdominal swelling,  or inability to keep fluids down. You have a work note to return on Wednesday.  -DYSURIA AND VULVAR PRURITUS: Dysuria means painful urination, and vulvar pruritus means itching in the genital area. A urine sample was taken for analysis to determine the cause of these symptoms.  -HEADACHE: You have been experiencing headaches, which may be related to your gastroenteritis. The best way to manage this is to stay hydrated and get plenty of rest.  INSTRUCTIONS:  Please follow up if you  experience any signs of severe dehydration or complications, such as bloody diarrhea, intense abdominal swelling, or inability to keep fluids down. Return to work on Wednesday as noted in your work note.   You are seen today for concerns of vulvovaginal discomfort and vaginal discharge changes.  Your symptoms appear consistent with a likely yeast infection so we have collected a cervicovaginal swab to assess for BV, yeast, trichomonas, gonorrhea and chlamydia.  Your urine was notable for signs of white blood cells which can be consistent with a UTI.  Since you are not having pain with urination I suspect this might be contamination from your vulvovaginal concern so I am sending a sample of your urine off for urine culture for definitive rule out of a UTI or infection.  We will keep you updated with those results once they are available  We will keep you updated on the results of your cervicovaginal swab once the results are available.  If any other medication or treatment is indicated by those results that will be sent into the pharmacy that we have on file. Please make sure that you are practicing safe sex and using barrier methods to prevent exposure. It is recommended to avoid intercourse until you have the results back from testing and have completed any treatments that are sent in for you.   To assist with your symptoms while we are waiting on your test results I have sent in a medication called Diflucan  for you to take once every 72 hours.  This medication treats yeast infections fairly well and has relatively low side effect burden.  To assist with any external irritation I am sending in a cream called nystatin.  This medication helps to treat yeast infections of the skin.  Please do not use this inside the vagina as it can cause further irritation.      ED Prescriptions     Medication Sig Dispense Auth. Provider   fluconazole  (DIFLUCAN ) 150 MG tablet Take 1 tablet (150 mg total) by mouth  every three (3) days as needed. May repeat in 3 days if symptoms not resolved 2 tablet Faria Casella E, PA-C   nystatin cream (MYCOSTATIN) Apply to affected area 2 times daily 30 g Chaeli Judy E, PA-C   ondansetron  (ZOFRAN -ODT) 4 MG disintegrating tablet Take 1 tablet (4 mg total) by mouth every 8 (eight) hours as needed for nausea or vomiting. 20 tablet Shakera Ebrahimi E, PA-C      PDMP not reviewed this encounter.   Marylene Rocky BRAVO, PA-C 05/22/24 9070

## 2024-05-22 NOTE — Discharge Instructions (Addendum)
 VISIT SUMMARY:  You came in today with abdominal pain, diarrhea, and weakness that have been ongoing for the past three to four days. You also reported headaches, itching in the genital area, and pain during urination. You have not had a fever or chills, but you feel nauseated and have not been eating well.  YOUR PLAN:  -ACUTE GASTROENTERITIS: Acute gastroenteritis is an infection of the stomach and intestines that causes symptoms like diarrhea, abdominal pain, and vomiting. You were given Zofran  to help with nausea, but it may cause constipation. You should stay hydrated by drinking water and electrolyte drinks like Pedialyte or Gatorade. Eat a bland diet including foods like applesauce, bananas, toast, boiled chicken, and rice. Watch for signs of severe dehydration or complications, such as bloody diarrhea, intense abdominal swelling, or inability to keep fluids down. You have a work note to return on Wednesday.  -DYSURIA AND VULVAR PRURITUS: Dysuria means painful urination, and vulvar pruritus means itching in the genital area. A urine sample was taken for analysis to determine the cause of these symptoms.  -HEADACHE: You have been experiencing headaches, which may be related to your gastroenteritis. The best way to manage this is to stay hydrated and get plenty of rest.  INSTRUCTIONS:  Please follow up if you experience any signs of severe dehydration or complications, such as bloody diarrhea, intense abdominal swelling, or inability to keep fluids down. Return to work on Wednesday as noted in your work note.   You are seen today for concerns of vulvovaginal discomfort and vaginal discharge changes.  Your symptoms appear consistent with a likely yeast infection so we have collected a cervicovaginal swab to assess for BV, yeast, trichomonas, gonorrhea and chlamydia.  Your urine was notable for signs of white blood cells which can be consistent with a UTI.  Since you are not having pain with  urination I suspect this might be contamination from your vulvovaginal concern so I am sending a sample of your urine off for urine culture for definitive rule out of a UTI or infection.  We will keep you updated with those results once they are available  We will keep you updated on the results of your cervicovaginal swab once the results are available.  If any other medication or treatment is indicated by those results that will be sent into the pharmacy that we have on file. Please make sure that you are practicing safe sex and using barrier methods to prevent exposure. It is recommended to avoid intercourse until you have the results back from testing and have completed any treatments that are sent in for you.   To assist with your symptoms while we are waiting on your test results I have sent in a medication called Diflucan  for you to take once every 72 hours.  This medication treats yeast infections fairly well and has relatively low side effect burden.  To assist with any external irritation I am sending in a cream called nystatin.  This medication helps to treat yeast infections of the skin.  Please do not use this inside the vagina as it can cause further irritation.

## 2024-05-23 ENCOUNTER — Ambulatory Visit (HOSPITAL_COMMUNITY): Payer: Self-pay

## 2024-05-23 LAB — CERVICOVAGINAL ANCILLARY ONLY
Bacterial Vaginitis (gardnerella): NEGATIVE
Candida Glabrata: NEGATIVE
Candida Vaginitis: POSITIVE — AB
Chlamydia: NEGATIVE
Comment: NEGATIVE
Comment: NEGATIVE
Comment: NEGATIVE
Comment: NEGATIVE
Comment: NEGATIVE
Comment: NORMAL
Neisseria Gonorrhea: NEGATIVE
Trichomonas: NEGATIVE

## 2024-05-23 LAB — URINE CULTURE: Culture: <10000 — AB

## 2024-05-25 ENCOUNTER — Other Ambulatory Visit: Payer: Self-pay

## 2024-05-25 ENCOUNTER — Emergency Department (HOSPITAL_COMMUNITY)
Admission: EM | Admit: 2024-05-25 | Discharge: 2024-05-25 | Disposition: A | Discharge status: Home / Self Care | Attending: Emergency Medicine | Admitting: Emergency Medicine

## 2024-05-25 ENCOUNTER — Emergency Department (HOSPITAL_COMMUNITY)

## 2024-05-25 DIAGNOSIS — R030 Elevated blood-pressure reading, without diagnosis of hypertension: Secondary | ICD-10-CM | POA: Diagnosis not present

## 2024-05-25 DIAGNOSIS — B349 Viral infection, unspecified: Secondary | ICD-10-CM | POA: Diagnosis not present

## 2024-05-25 DIAGNOSIS — R519 Headache, unspecified: Secondary | ICD-10-CM | POA: Insufficient documentation

## 2024-05-25 DIAGNOSIS — R112 Nausea with vomiting, unspecified: Secondary | ICD-10-CM

## 2024-05-25 LAB — URINALYSIS, ROUTINE W REFLEX MICROSCOPIC
Bilirubin Urine: NEGATIVE
Glucose, UA: NEGATIVE mg/dL
Hgb urine dipstick: NEGATIVE
Ketones, ur: NEGATIVE mg/dL
Nitrite: NEGATIVE
Protein, ur: NEGATIVE mg/dL
Specific Gravity, Urine: 1.009 (ref 1.005–1.030)
pH: 6 (ref 5.0–8.0)

## 2024-05-25 LAB — HCG, SERUM, QUALITATIVE: Preg, Serum: NEGATIVE

## 2024-05-25 LAB — CBC
HCT: 38.8 % (ref 36.0–46.0)
Hemoglobin: 12.5 g/dL (ref 12.0–15.0)
MCH: 28.9 pg (ref 26.0–34.0)
MCHC: 32.2 g/dL (ref 30.0–36.0)
MCV: 89.8 fL (ref 80.0–100.0)
Platelets: 313 K/uL (ref 150–400)
RBC: 4.32 MIL/uL (ref 3.87–5.11)
RDW: 12.5 % (ref 11.5–15.5)
WBC: 5.4 K/uL (ref 4.0–10.5)
nRBC: 0 % (ref 0.0–0.2)

## 2024-05-25 LAB — COMPREHENSIVE METABOLIC PANEL WITH GFR
ALT: 14 U/L (ref 0–44)
AST: 16 U/L (ref 15–41)
Albumin: 3.5 g/dL (ref 3.5–5.0)
Alkaline Phosphatase: 49 U/L (ref 38–126)
Anion gap: 10 (ref 5–15)
BUN: <5 mg/dL — ABNORMAL LOW (ref 6–20)
CO2: 23 mmol/L (ref 22–32)
Calcium: 9.3 mg/dL (ref 8.9–10.3)
Chloride: 107 mmol/L (ref 98–111)
Creatinine, Ser: 0.54 mg/dL (ref 0.44–1.00)
GFR, Estimated: >60 mL/min (ref >60)
Glucose, Bld: 88 mg/dL (ref 70–99)
Potassium: 4 mmol/L (ref 3.5–5.1)
Sodium: 140 mmol/L (ref 135–145)
Total Bilirubin: 0.6 mg/dL (ref 0.0–1.2)
Total Protein: 7.4 g/dL (ref 6.5–8.1)

## 2024-05-25 LAB — LIPASE, BLOOD: Lipase: 24 U/L (ref 11–51)

## 2024-05-25 MED ORDER — ACETAMINOPHEN 500 MG PO TABS
1000.0000 mg | ORAL_TABLET | Freq: Once | ORAL | Status: AC
  Administered 2024-05-25: 1000 mg via ORAL
  Filled 2024-05-25: qty 2

## 2024-05-25 NOTE — ED Triage Notes (Signed)
 Pt. Stated, Im still feeling sick . I went to UC last Wednesday for N/V/D , and weakness for over a week now. They gave me some medicine for nausea and I take it. Im just no better

## 2024-05-25 NOTE — Discharge Instructions (Addendum)
 It was our pleasure to provide your ER care today - we hope that you feel better.  Drink plenty of fluids/stay well hydrated.  Take acetaminophen  or ibuprofen  as need.   Follow up closely with primary care doctor in the next 2-3 days if symptoms fail to improve/resolve. Also follow up with primary care doctor regarding your blood pressure that is mildly high today.   Return to ER if worse, new symptoms, fevers, new/severe pain, severe headache, persistent vomiting, new or worsening or severe abdominal pain, or other concern.

## 2024-05-25 NOTE — ED Provider Notes (Signed)
 [place] EMERGENCY DEPARTMENT AT [facility] Provider Note   CSN: [id number] Arrival date & time: 05/25/24  1004     Patient presents with: Emesis, Nausea, Diarrhea, and Fatigue   [name] is a 43 y.o. female.   Pt c/o recent nausea, vomiting and diarrhea. Indicates was seen at urgent care, given meds for same. Indicates no vomiting or diarrhea but still does not feel well. Had some abdominal cramping in past couple days, but today, no abdominal pain. No dysuria. No vaginal discharge or bleeding. Last period ~ 3-4 weeks ago. Also notes frontal headache, dull, moderate. No syncope. No eye pain or change in vision. No neck pain or stiffness. Denies cough, sore throat, sinus congestion/pain, or other uri symptoms. No specific known ill contacts. No fever or chills.   The history is provided by the patient and medical records.  Emesis Associated symptoms: diarrhea and headaches   Associated symptoms: no abdominal pain, no chills, no cough, no fever and no sore throat   Diarrhea Associated symptoms: headaches and vomiting   Associated symptoms: no abdominal pain, no chills and no fever        Prior to Admission medications   Medication Sig Start Date End Date Taking? Authorizing Provider  Blood Glucose Monitoring Suppl DEVI May substitute to any manufacturer covered by patient's insurance check blood glucose twice daily before meals 05/25/23   Adella Norris, MD  Calcium  250 MG CAPS 500 mg twice daily 05/25/23   Adella Norris, MD  cetirizine  (ZYRTEC ) 10 MG tablet Take 1 tablet by mouth once daily 05/17/24   Adella Norris, MD  Cholecalciferol (VITAMIN D3 PO) Take one pill daily, 1000 IU    [provider]  fluconazole  (DIFLUCAN ) 150 MG tablet Take 1 tablet (150 mg total) by mouth every three (3) days as needed. May repeat in 3 days if symptoms not resolved 05/22/24   Mecum, Erin E, PA-C  fluticasone  (FLONASE ) 50 MCG/ACT nasal spray Place 2  sprays into both nostrils daily. 12/21/22   Rising, Asberry, PA-C  gabapentin  (NEURONTIN ) 300 MG capsule Take 1 capsule by mouth twice daily 05/23/24   Adella Norris, MD  Glucose Blood (BLOOD GLUCOSE TEST STRIPS) STRP May substitute to any manufacturer covered by patient's insurance Check blood glucose twice daily before meals 05/25/23   Adella Norris, MD  ibuprofen  (ADVIL ) 800 MG tablet TAKE 1 TABLET BY MOUTH EVERY 8 HOURS AS NEEDED FOR PAIN/HEADACHE WITH MEALS 12/27/23   Adella Norris, MD  Lancet Device MISC May substitute to any manufacturer covered by patient's insurance  check blood glucose twice daily before meals. 05/25/23   Adella Norris, MD  Lancets Misc. MISC May substitute to any manufacturer covered by patient's insurance check blood glucose twice daily before meals 05/25/23   Adella Norris, MD  metFORMIN  (GLUCOPHAGE ) 500 MG tablet TAKE 1 TABLET BY MOUTH TWICE DAILY WITH A MEAL 03/27/24   Adella Norris, MD  nystatin cream (MYCOSTATIN) Apply to affected area 2 times daily 05/22/24   Mecum, Erin E, PA-C  ondansetron  (ZOFRAN -ODT) 4 MG disintegrating tablet Take 1 tablet (4 mg total) by mouth every 8 (eight) hours as needed for nausea or vomiting. 05/22/24   Mecum, Erin E, PA-C  sodium chloride  (OCEAN) 0.65 % SOLN nasal spray Place 1 spray into both nostrils as needed for congestion. 11/20/22   Dreama, [place]  N, FNP    Allergies: Patient has no known allergies.    Review of Systems  Constitutional:  Negative for chills and fever.  HENT:  Negative for sinus pressure, sinus pain and sore throat.   Eyes:  Negative for pain, redness and visual disturbance.  Respiratory:  Negative for cough and shortness of breath.   Cardiovascular:  Negative for chest pain and leg swelling.  Gastrointestinal:  Positive for diarrhea, nausea and vomiting. Negative for abdominal pain.  Genitourinary:  Negative for dysuria, flank pain, vaginal bleeding and vaginal discharge.   Musculoskeletal:  Negative for back pain, neck pain and neck stiffness.  Skin:  Negative for rash.  Neurological:  Positive for headaches. Negative for syncope, speech difficulty, weakness and numbness.  Hematological:  Does not bruise/bleed easily.    Updated Vital Signs BP (!) 154/80 (BP Location: Right Arm)   Pulse 62   Temp 99.5 F (37.5 C)   Resp 16   Ht 1.626 m (5' 4)   Wt 86.2 kg   LMP 05/02/2024 (Approximate)   SpO2 100%   BMI 32.61 kg/m   Physical Exam Vitals and nursing note reviewed.  Constitutional:      Appearance: Normal appearance. She is well-developed.  HENT:     Head: Atraumatic.     Comments: No sinus, mastoid area, or temporal tenderness.     Nose: Nose normal.     Mouth/Throat:     Mouth: Mucous membranes are moist.     Pharynx: Oropharynx is clear. No oropharyngeal exudate or posterior oropharyngeal erythema.  Eyes:     General: No scleral icterus.    Conjunctiva/sclera: Conjunctivae normal.     Pupils: Pupils are equal, round, and reactive to light.  Neck:     Trachea: No tracheal deviation.     Comments: Trachea midline, thyroid  not grossly enlarged or tender. No neck stiffness or rigidity.  Cardiovascular:     Rate and Rhythm: Normal rate and regular rhythm.     Pulses: Normal pulses.     Heart sounds: Normal heart sounds. No murmur heard.    No friction rub. No gallop.  Pulmonary:     Effort: Pulmonary effort is normal. No respiratory distress.     Breath sounds: Normal breath sounds.  Abdominal:     General: Bowel sounds are normal. There is no distension.     Palpations: Abdomen is soft. There is no mass.     Tenderness: There is no abdominal tenderness. There is no guarding.  Genitourinary:    Comments: No cva tenderness.  Musculoskeletal:        General: No swelling or tenderness.     Cervical back: Normal range of motion and neck supple. No rigidity. No muscular tenderness.     Right lower leg: No edema.     Left lower leg: No  edema.  Skin:    General: Skin is warm and dry.     Findings: No rash.  Neurological:     Mental Status: She is alert.     Comments: Alert, speech normal. Motor/sens grossly intact bil. Steady gait.   Psychiatric:        Mood and Affect: Mood normal.     (all labs ordered are listed, but only abnormal results are displayed) Results for orders placed or performed during the hospital encounter of 05/25/24  Lipase, blood   Collection Time: 05/25/24 11:23 AM  Result Value Ref Range   Lipase 24 11 - 51 U/L  Comprehensive metabolic panel   Collection Time: 05/25/24 11:23 AM  Result Value Ref Range   Sodium 140 135 - 145 mmol/L   Potassium 4.0 3.5 -  5.1 mmol/L   Chloride 107 98 - 111 mmol/L   CO2 23 22 - 32 mmol/L   Glucose, Bld 88 70 - 99 mg/dL   BUN <5 (L) 6 - 20 mg/dL   Creatinine, Ser 9.45 0.44 - 1.00 mg/dL   Calcium  9.3 8.9 - 10.3 mg/dL   Total Protein 7.4 6.5 - 8.1 g/dL   Albumin 3.5 3.5 - 5.0 g/dL   AST 16 15 - 41 U/L   ALT 14 0 - 44 U/L   Alkaline Phosphatase 49 38 - 126 U/L   Total Bilirubin 0.6 0.0 - 1.2 mg/dL   GFR, Estimated >39 >39 mL/min   Anion gap 10 5 - 15  CBC   Collection Time: 05/25/24 11:23 AM  Result Value Ref Range   WBC 5.4 4.0 - 10.5 K/uL   RBC 4.32 3.87 - 5.11 MIL/uL   Hemoglobin 12.5 12.0 - 15.0 g/dL   HCT 61.1 63.9 - 53.9 %   MCV 89.8 80.0 - 100.0 fL   MCH 28.9 26.0 - 34.0 pg   MCHC 32.2 30.0 - 36.0 g/dL   RDW 87.4 88.4 - 84.4 %   Platelets 313 150 - 400 K/uL   nRBC 0.0 0.0 - 0.2 %  hCG, serum, qualitative   Collection Time: 05/25/24 11:23 AM  Result Value Ref Range   Preg, Serum NEGATIVE NEGATIVE  Urinalysis, Routine w reflex microscopic -Urine, Clean Catch   Collection Time: 05/25/24 11:32 AM  Result Value Ref Range   Color, Urine YELLOW YELLOW   APPearance CLEAR CLEAR   Specific Gravity, Urine 1.009 1.005 - 1.030   pH 6.0 5.0 - 8.0   Glucose, UA NEGATIVE NEGATIVE mg/dL   Hgb urine dipstick NEGATIVE NEGATIVE   Bilirubin Urine  NEGATIVE NEGATIVE   Ketones, ur NEGATIVE NEGATIVE mg/dL   Protein, ur NEGATIVE NEGATIVE mg/dL   Nitrite NEGATIVE NEGATIVE   Leukocytes,Ua SMALL (A) NEGATIVE   RBC / HPF 0-5 0 - 5 RBC/hpf   WBC, UA 0-5 0 - 5 WBC/hpf   Bacteria, UA RARE (A) NONE SEEN   Squamous Epithelial / HPF 0-5 0 - 5 /HPF     EKG: None  Radiology: CT Head Wo Contrast Result Date: 05/25/2024 EXAM: CT HEAD WITHOUT CONTRAST 05/25/2024 05:46:17 PM TECHNIQUE: CT of the head was performed without the administration of intravenous contrast. Automated exposure control, iterative reconstruction, and/or weight based adjustment of the mA/kV was utilized to reduce the radiation dose to as low as reasonably achievable. COMPARISON: None available. CLINICAL HISTORY: Headache, increasing frequency or severity. FINDINGS: BRAIN AND VENTRICLES: No acute hemorrhage. No evidence of acute infarct. No hydrocephalus. No extra-axial collection. No mass effect or midline shift. ORBITS: No acute abnormality. SINUSES: No acute abnormality. SOFT TISSUES AND SKULL: No acute soft tissue abnormality. No skull fracture. IMPRESSION: 1. No acute intracranial abnormality Electronically signed by: Norman Gatlin MD 05/25/2024 06:01 PM EST RP Workstation: [id number]     Procedures   Medications Ordered in the ED  acetaminophen  (TYLENOL ) tablet 1,000 mg (1,000 mg Oral Given 05/25/24 1756)                                    Medical Decision Making Problems Addressed: Elevated blood pressure reading: acute illness or injury Frontal headache: acute illness or injury Nausea vomiting and diarrhea: acute illness or injury with systemic symptoms that poses a threat to life or bodily functions Viral syndrome:  acute illness or injury  Amount and/or Complexity of Data Reviewed External Data Reviewed: notes. Labs: ordered. Decision-making details documented in ED Course. Radiology: ordered and independent interpretation performed. Decision-making details  documented in ED Course.  Risk OTC drugs. Decision regarding hospitalization.   Labs ordered/sent. Imaging ordered.   Differential diagnosis includes gastroenteritis, dehydration, migraine, etc. Dispo decision including potential need for admission considered - will get labs and imaging and reassess.   Reviewed nursing notes and prior charts for additional history. External reports reviewed.   Labs reviewed/interpreted by me - wbc and hgb normal. Chem unremarkable. Ua neg for uti. Preg neg.   CT reviewed/interpreted by me - no hem.   Po fluids. Acetaminophen  po.   Recheck, pt comfortable. Tolerating po. No abd pain or tenderness on exam/re-exam. No headache.   Pt currently appears stable for ED d/c.   Rec close pcp f/u.  Return precautions provided.       Final diagnoses:  None    ED Discharge Orders     None          Bernard Drivers, MD 05/25/24 2035

## 2024-05-25 NOTE — ED Notes (Signed)
 PO challenge started, pt not c/o nausea/ vomiting or abd pain prior to food given.

## 2024-05-25 NOTE — ED Triage Notes (Signed)
 Pt arrived POV d/t not feeling well c/o headache, dizziness and feeling weak for about a week, rates pain 10/10.

## 2024-06-02 ENCOUNTER — Other Ambulatory Visit (INDEPENDENT_AMBULATORY_CARE_PROVIDER_SITE_OTHER)

## 2024-06-02 DIAGNOSIS — Z0000 Encounter for general adult medical examination without abnormal findings: Secondary | ICD-10-CM

## 2024-06-03 LAB — COMPREHENSIVE METABOLIC PANEL WITH GFR
ALT: 10 IU/L (ref 0–32)
AST: 11 IU/L (ref 0–40)
Albumin: 4 g/dL (ref 3.9–4.9)
Alkaline Phosphatase: 54 IU/L (ref 41–116)
BUN/Creatinine Ratio: 12 (ref 9–23)
BUN: 6 mg/dL (ref 6–24)
Bilirubin Total: 0.3 mg/dL (ref 0.0–1.2)
CO2: 23 mmol/L (ref 20–29)
Calcium: 9.1 mg/dL (ref 8.7–10.2)
Chloride: 106 mmol/L (ref 96–106)
Creatinine, Ser: 0.49 mg/dL — ABNORMAL LOW (ref 0.57–1.00)
Globulin, Total: 2.6 g/dL (ref 1.5–4.5)
Glucose: 95 mg/dL (ref 70–99)
Potassium: 4.2 mmol/L (ref 3.5–5.2)
Sodium: 142 mmol/L (ref 134–144)
Total Protein: 6.6 g/dL (ref 6.0–8.5)
eGFR: 118 mL/min/1.73 (ref >59)

## 2024-06-03 LAB — CBC WITH DIFFERENTIAL/PLATELET
Basophils Absolute: 0 x10E3/uL (ref 0.0–0.2)
Basos: 1 %
EOS (ABSOLUTE): 0.1 x10E3/uL (ref 0.0–0.4)
Eos: 2 %
Hematocrit: 38 % (ref 34.0–46.6)
Hemoglobin: 12 g/dL (ref 11.1–15.9)
Immature Grans (Abs): 0 x10E3/uL (ref 0.0–0.1)
Immature Granulocytes: 0 %
Lymphocytes Absolute: 1.4 x10E3/uL (ref 0.7–3.1)
Lymphs: 36 %
MCH: 28.7 pg (ref 26.6–33.0)
MCHC: 31.6 g/dL (ref 31.5–35.7)
MCV: 91 fL (ref 79–97)
Monocytes Absolute: 0.3 x10E3/uL (ref 0.1–0.9)
Monocytes: 9 %
Neutrophils Absolute: 2.1 x10E3/uL (ref 1.4–7.0)
Neutrophils: 52 %
Platelets: 265 x10E3/uL (ref 150–450)
RBC: 4.18 x10E6/uL (ref 3.77–5.28)
RDW: 12.4 % (ref 11.7–15.4)
WBC: 3.8 x10E3/uL (ref 3.4–10.8)

## 2024-06-03 LAB — LIPID PANEL W/O CHOL/HDL RATIO
Cholesterol, Total: 182 mg/dL (ref 100–199)
HDL: 55 mg/dL (ref >39)
LDL Chol Calc (NIH): 110 mg/dL — ABNORMAL HIGH (ref 0–99)
Triglycerides: 96 mg/dL (ref 0–149)
VLDL Cholesterol Cal: 17 mg/dL (ref 5–40)

## 2024-06-03 LAB — TSH: TSH: 0.74 u[IU]/mL (ref 0.450–4.500)

## 2024-06-03 LAB — HEMOGLOBIN A1C
Est. average glucose Bld gHb Est-mCnc: 146 mg/dL
Hgb A1c MFr Bld: 6.7 % — ABNORMAL HIGH (ref 4.8–5.6)

## 2024-06-06 ENCOUNTER — Other Ambulatory Visit

## 2024-06-12 ENCOUNTER — Encounter: Payer: Self-pay | Admitting: Internal Medicine

## 2024-06-12 ENCOUNTER — Ambulatory Visit (INDEPENDENT_AMBULATORY_CARE_PROVIDER_SITE_OTHER): Admitting: Internal Medicine

## 2024-06-12 VITALS — BP 138/80 | HR 82 | Resp 18 | Ht 63.0 in | Wt 200.0 lb

## 2024-06-12 DIAGNOSIS — Z1211 Encounter for screening for malignant neoplasm of colon: Secondary | ICD-10-CM

## 2024-06-12 DIAGNOSIS — E119 Type 2 diabetes mellitus without complications: Secondary | ICD-10-CM

## 2024-06-12 DIAGNOSIS — Z23 Encounter for immunization: Secondary | ICD-10-CM | POA: Diagnosis not present

## 2024-06-12 DIAGNOSIS — Z0000 Encounter for general adult medical examination without abnormal findings: Secondary | ICD-10-CM

## 2024-06-12 DIAGNOSIS — Z1231 Encounter for screening mammogram for malignant neoplasm of breast: Secondary | ICD-10-CM | POA: Diagnosis not present

## 2024-06-12 DIAGNOSIS — E785 Hyperlipidemia, unspecified: Secondary | ICD-10-CM

## 2024-06-12 MED ORDER — CALCIUM CITRATE + PO TABS: ORAL_TABLET | ORAL | 11 refills | Status: AC

## 2024-06-12 NOTE — Patient Instructions (Addendum)
 Would go to [facility] and get a COVID  vaccine. Look into whether your insurance covers the Human Papilloma Virus vaccine (HPV).  If it does, would get the 3 dose series.  +

## 2024-06-12 NOTE — Progress Notes (Signed)
 Subjective:    Patient ID: [name], female   DOB: Jan 05, 1981, 43 y.o.   MRN: [id number]   HPI  CPE without pap  1.  Pap: Last 05/2023 and normal.    2.  Mammogram:  Benign asymmetry in right breast with follow up mammogram 06/2023.  She did receive a letter to make an appt, but has not yet done so.    3.  Osteoprevention:  Did not realize her calcium  could be obtained OTC.  Walks at work throughout day.  Housekeeping.    4.  Guaiac Cards/FIT:  Last 05/2022 and negative for blood.  5.  Colonoscopy:  Did not get adequate exam in 2020 due to poor prep--only to tranverse colon.  She did not respond to letters asking for reschedule.   Willing to reschedule this year.  6.  Immunizations:  Has not had influenza nor COVID vaccines this year. Immunization History  Administered Date(s) Administered   Fluzone Influenza virus vaccine,trivalent (IIV3), split virus 04/26/2019   Influenza Inj Mdck Quad Pf 04/27/2017   Influenza, Mdck, Trivalent,PF 6+ MOS(egg free) 05/25/2023   Influenza-Unspecified 07/03/2015, 06/07/2018, 05/09/2021, 05/13/2022   Moderna Covid-19 Fall Seasonal Vaccine 69yrs & older 05/25/2023   Moderna Sars-Covid-2 Vaccination 07/25/2019, 08/24/2019, 06/25/2020   Pneumococcal Polysaccharide-23 04/06/2016   Tdap 03/06/2015     7.  Glucose/Cholesterol:  A1C back into diabetic range at 6.7%  Her LDL remains too high as well.  She last had a pregnancy about 2 years ago and is still having regular periods.  She is unwilling to use BC in past.  Discussed even condoms.  She may consider nexplanon to be placed at [facility].  +  Lipid Panel     Component Value Date/Time   CHOL 182 06/02/2024 0844   TRIG 96 06/02/2024 0844   HDL 55 06/02/2024 0844   LDLCALC 110 (H) 06/02/2024 0844   LABVLDL 17 06/02/2024 0844     Current Meds  Medication Sig   Blood Glucose Monitoring Suppl DEVI May substitute to any manufacturer covered by patient's insurance check blood glucose twice  daily before meals   cetirizine  (ZYRTEC ) 10 MG tablet Take 1 tablet by mouth once daily   Cholecalciferol (VITAMIN D3 PO) Take one pill daily, 1000 IU   gabapentin  (NEURONTIN ) 300 MG capsule Take 1 capsule by mouth twice daily   Glucose Blood (BLOOD GLUCOSE TEST STRIPS) STRP May substitute to any manufacturer covered by patient's insurance Check blood glucose twice daily before meals   ibuprofen  (ADVIL ) 800 MG tablet TAKE 1 TABLET BY MOUTH EVERY 8 HOURS AS NEEDED FOR PAIN/HEADACHE WITH MEALS   Lancet Device MISC May substitute to any manufacturer covered by patient's insurance  check blood glucose twice daily before meals.   Lancets Misc. MISC May substitute to any manufacturer covered by patient's insurance check blood glucose twice daily before meals   metFORMIN  (GLUCOPHAGE ) 500 MG tablet TAKE 1 TABLET BY MOUTH TWICE DAILY WITH A MEAL   No Known Allergies  Past Medical History:  Diagnosis Date   Biological false positive RPR test 08/23/2016   FTA-ABS negative   Diabetes mellitus without complication (HCC) 2011   Dyslipidemia 04/27/2017   Gestational diabetes    Hyperthyroidism    Positive PPD 2016   + PPD :  history of BCG as well.  Was unable to treat for latent when pregnant.  Sent for CXR 12.2017, which was negative for active TB.  To go to [facility] for treatment of latent.  07/2016 documented   Thyroid  nodule 02/05/2017   Thyroid  nodule with FNA with atypia of unknown significance and hyperthyroidism (Grave's Disease).  Was seen by Dr. Faythe Ee Endocrine.  Apparently deciding whether to undergo thyroidectomy vs medication  (methimazole or RAI treatment per patient.) She elected to take Methimazole.  Stopped with pregnancy in 10/2019 and thyroid  testing okay   Visual acuity reduced 2008   Wears glasses only for nighttime driving   Past Surgical History:  Procedure Laterality Date   CESAREAN SECTION N/A 03/04/2015   Procedure: CESAREAN SECTION;  Surgeon: Aida DELENA Na, MD;  Location:  [facility];  Service: Obstetrics;  Laterality: N/A;   DILATION AND EVACUATION N/A 10/18/2019   Procedure: DILATATION AND EVACUATION;  Surgeon: Alger Gong, MD;  Location: [facility];  Service: Gynecology;  Laterality: N/A;   FNA thyroid  nodule  06/22/2018   path equivocal--cells of unknown signficance.  Followed by Endocrine/[facility] and being treated with Methimazole for hyperthryroidism.   Family History  Problem Relation Age of Onset   Diabetes Mother    Hepatitis B Father        cirrhosis with liver failure--complications of Chronic Hep B.   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   Originally from [place]   Came to [employer] In [place]     Her first 2 children are with her first husband, who came with her to [place] And was killed in MVA here.   Middle child with a second man she married and divorced after 5 years.     Younger two children with her current husband   Her oldest daughter is married (10/2015) in [place]--grew up in maternal grandmother's home (has not been with this daughter since her daughter was 21 yo)   Lives with third husband and 3 of her children.     Son now in [place] and working at [employer]      Social Drivers of [employer]: Low Risk  (06/12/2024)    Overall Financial Resource Strain (CARDIA)    Difficulty of Paying Living Expenses: Not hard at all  Food Insecurity: No Food Insecurity (06/12/2024)   Hunger Vital Sign    Worried About Running Out of Food in the Last Year: Never true    Ran Out of Food in the Last Year: Never true  Transportation Needs: No Transportation Needs (06/12/2024)   PRAPARE - Administrator, Civil Service (Medical): No    Lack of Transportation (Non-Medical): No  Physical Activity: Sufficiently Active (03/07/2019)   Exercise Vital Sign    Days of Exercise per Week: 7 days    Minutes of Exercise per Session: 30  min  Stress: Not on file  Social Connections: Unknown (03/07/2019)   Social Connection and Isolation Panel    Frequency of Communication with Friends and Family: Not on file    Frequency of Social Gatherings with Friends and Family: Not on file    Attends Religious Services: Not on file    Active Member of Clubs or Organizations: Not on file    Attends Banker Meetings: Not on file    Marital Status: Married  Intimate Partner Violence: Not At Risk (06/12/2024)   Humiliation, Afraid, Rape, and Kick questionnaire    Fear of Current or Ex-Partner: No    Emotionally Abused: No    Physically Abused: No    Sexually Abused: No   When asked about treatment from husband, she hesitates, then states there is nothing now, but she will let me know if something develops.  She refused to discuss further.   Review of Systems  HENT:  Negative for dental problem (Does not have a dentist.).   Eyes:  Negative for visual disturbance (Thinks she missed her eye appt.).  Respiratory:  Negative for shortness of breath.   Cardiovascular:  Negative for chest pain and leg swelling.  Gastrointestinal:  Negative for abdominal pain and blood in stool (No melena.).  Genitourinary:        Cramping with period relieved by ibuprofen .  Neurological:  Negative for weakness and numbness.   Psychiatric/Behavioral:  Negative for dysphoric mood. The patient is not nervous/anxious.       Objective:   BP 138/80 (BP Location: Right Arm, Patient Position: Sitting, Cuff Size: Normal)   Pulse 82   Resp 18   Ht 5' 3 (1.6 m)   Wt 200 lb (90.7 kg)   LMP 05/28/2024 (Approximate)   BMI 35.43 kg/m   Physical Exam Constitutional:      Appearance: She is obese.  HENT:     Head: Normocephalic and atraumatic.     Right Ear: Tympanic membrane, ear canal and external ear normal.     Left Ear: Tympanic membrane, ear canal and external ear normal.     Nose: Nose normal.     Mouth/Throat:     Mouth: Mucous membranes are moist.     Pharynx: Oropharynx is clear.  Eyes:     Extraocular Movements: Extraocular movements intact.     Conjunctiva/sclera: Conjunctivae normal.     Pupils: Pupils are equal, round, and reactive to light.     Comments: Discs sharp  Neck:     Thyroid : Thyromegaly (large, firm and nodular) present.  Cardiovascular:     Rate and Rhythm: Normal rate and regular rhythm.     Pulses:          Dorsalis pedis pulses are 2+ on the right side and 2+ on the left side.       Posterior tibial pulses are 2+ on the right side and 2+ on the left side.     Heart sounds: S1 normal and S2 normal. No murmur heard.    No S3 or S4 sounds.     Comments: No carotid bruits.  Carotid, radial, femoral, DP and PT pulses normal and equal.   Pulmonary:     Effort: Pulmonary effort is normal.     Breath sounds: Normal breath sounds and air entry.  Chest:  Breasts:    Right: No inverted nipple, mass or nipple discharge.     Left: No inverted nipple, mass or nipple discharge.  Abdominal:  General: Bowel sounds are normal.     Palpations: Abdomen is soft. There is no hepatomegaly, splenomegaly or mass.     Tenderness: There is no abdominal tenderness.     Hernia: No hernia is present.  Genitourinary:    Comments: Normal external female genitalia No uterine or adnexal mass or  tenderness Musculoskeletal:        General: Normal range of motion.     Cervical back: Normal range of motion and neck supple.     Right lower leg: No edema.     Left lower leg: No edema.  Feet:     Right foot:     Protective Sensation: 10 sites tested.  10 sites sensed.     Skin integrity: Skin integrity normal.     Left foot:     Protective Sensation: 10 sites tested.  10 sites sensed.     Skin integrity: Skin integrity normal.     Comments: Little toenails dark and thickened. Lymphadenopathy:     Head:     Right side of head: No submental or submandibular adenopathy.     Left side of head: No submental or submandibular adenopathy.     Cervical: No cervical adenopathy.     Upper Body:     Right upper body: No supraclavicular or axillary adenopathy.     Left upper body: No supraclavicular or axillary adenopathy.     Lower Body: No right inguinal adenopathy. No left inguinal adenopathy.  Skin:    General: Skin is warm.     Capillary Refill: Capillary refill takes less than 2 seconds.     Findings: No rash.  Neurological:     General: No focal deficit present.     Mental Status: She is alert and oriented to person, place, and time.     Cranial Nerves: Cranial nerves 2-12 are intact.     Sensory: Sensation is intact.     Motor: Motor function is intact.     Coordination: Coordination is intact.     Gait: Gait is intact.     Deep Tendon Reflexes: Reflexes are normal and symmetric.  Psychiatric:        Mood and Affect: Mood normal.        Behavior: Behavior normal. Behavior is cooperative.       Assessment & Plan   CPE without pap Ordered mammogram Send again for colonoscopy.  Discussed needs to make sure she finishes prep. Influenza vaccine-Flucelvax List for COVID vaccine.  2.  DM:  control not as good as previous years.  Encouraged working on lifestyle changes.  Unable to consider Jardiance or Ozempic until she uses some form of birth control.  She will look into  Nexplanon with [facility].   Eye referral to Dr. Octavia.  3.  Dyslipidemia with high LDL:  no statin until uses some form of birth control, even condoms.    4. Multinodular goiter with hyperthyroidism:  Followed by Dr. Faythe, endocrine.  Has not been on methimazole since becoming pregnant in 2021.

## 2024-06-23 ENCOUNTER — Other Ambulatory Visit: Payer: Self-pay | Admitting: Internal Medicine

## 2024-06-29 ENCOUNTER — Other Ambulatory Visit (INDEPENDENT_AMBULATORY_CARE_PROVIDER_SITE_OTHER)

## 2024-06-29 ENCOUNTER — Telehealth: Payer: Self-pay

## 2024-06-29 DIAGNOSIS — N760 Acute vaginitis: Secondary | ICD-10-CM

## 2024-06-29 LAB — POCT WET PREP WITH KOH
KOH Prep POC: NEGATIVE
RBC Wet Prep HPF POC: NEGATIVE
Trichomonas, UA: NEGATIVE

## 2024-06-29 MED ORDER — IBUPROFEN 800 MG PO TABS: ORAL_TABLET | ORAL | 3 refills | Status: AC

## 2024-06-29 MED ORDER — FLUCONAZOLE 150 MG PO TABS: ORAL_TABLET | ORAL | 0 refills | Status: AC

## 2024-06-29 NOTE — Progress Notes (Signed)
 As patient requested , refill for ibuprofen  is sent to [facility] pharmacy and the patient is informed

## 2024-06-29 NOTE — Telephone Encounter (Signed)
 The patient called stating that she has yeast infection on her  privet area ( vagina )  She says that the symptoms  are itching , burning and dysuria( pain at the time of urination). The frequency for urination is normal. The symptoms started yesterday and the patient says she is not comfortable and she needs medication. She asking for for an appointment soon.

## 2024-06-29 NOTE — Progress Notes (Signed)
 Called patient as wet prep from self swab with possible budding yeast, but otherwise appears normal. She is having mainly burning, some itching and burns in vulvar area with urination.   Similar to when diagnosed with yeast infection at urgent care last month.    Discussed needs to keep track of sugars as if her sugars are higher, will have more issues with yeast infections. She was not aware and will check more closely. Fluconazole  150 mg daily for 3 days.   Call if does not resolve

## 2024-06-29 NOTE — Telephone Encounter (Signed)
 Per Dr Adella , I called her to come for self swab today in the afternoon.

## 2024-08-08 ENCOUNTER — Telehealth: Payer: Self-pay | Admitting: Internal Medicine

## 2024-08-08 NOTE — Telephone Encounter (Signed)
 Difficult to get a clear history, but sounds like she is having vaginal itching and burning and dysuria.  Not clear if the burning on urination is external.  Not clear if discharge. Her symptoms apparently improved with Fluconazole  x 3 days back on 12/18, but have recently worsened.   She will need an appt tomorrow to be seen in office and not just for self swab.  Will also need CC UA

## 2024-08-14 ENCOUNTER — Encounter: Payer: Self-pay | Admitting: Gastroenterology

## 2024-08-15 NOTE — Telephone Encounter (Signed)
Called patient, patient did not answer.

## 2024-08-15 NOTE — Telephone Encounter (Signed)
 Patient called back and states she no longer needs appointment.

## 2024-12-12 ENCOUNTER — Other Ambulatory Visit

## 2024-12-18 ENCOUNTER — Ambulatory Visit: Admitting: Internal Medicine

## 2025-06-15 ENCOUNTER — Other Ambulatory Visit: Admitting: Internal Medicine

## 2025-06-20 ENCOUNTER — Encounter: Admitting: Internal Medicine
